# Patient Record
Sex: Female | Born: 1960 | Race: White | Hispanic: No | Marital: Married | Smoking: Current every day smoker
Health system: Southern US, Community
[De-identification: ages and names within clinical notes are randomized; demographics above are authoritative.]

## PROBLEM LIST (undated history)

## (undated) DIAGNOSIS — K219 Gastro-esophageal reflux disease without esophagitis: Secondary | ICD-10-CM

## (undated) DIAGNOSIS — B9689 Other specified bacterial agents as the cause of diseases classified elsewhere: Secondary | ICD-10-CM

## (undated) DIAGNOSIS — F419 Anxiety disorder, unspecified: Secondary | ICD-10-CM

## (undated) DIAGNOSIS — N898 Other specified noninflammatory disorders of vagina: Secondary | ICD-10-CM

## (undated) DIAGNOSIS — N760 Acute vaginitis: Secondary | ICD-10-CM

## (undated) DIAGNOSIS — R102 Pelvic and perineal pain: Secondary | ICD-10-CM

## (undated) DIAGNOSIS — J310 Chronic rhinitis: Secondary | ICD-10-CM

## (undated) DIAGNOSIS — M8580 Other specified disorders of bone density and structure, unspecified site: Secondary | ICD-10-CM

## (undated) DIAGNOSIS — Z87440 Personal history of urinary (tract) infections: Secondary | ICD-10-CM

## (undated) DIAGNOSIS — Z8639 Personal history of other endocrine, nutritional and metabolic disease: Secondary | ICD-10-CM

## (undated) DIAGNOSIS — M1990 Unspecified osteoarthritis, unspecified site: Secondary | ICD-10-CM

## (undated) DIAGNOSIS — I10 Essential (primary) hypertension: Secondary | ICD-10-CM

## (undated) DIAGNOSIS — M549 Dorsalgia, unspecified: Secondary | ICD-10-CM

## (undated) DIAGNOSIS — R350 Frequency of micturition: Secondary | ICD-10-CM

## (undated) DIAGNOSIS — J449 Chronic obstructive pulmonary disease, unspecified: Secondary | ICD-10-CM

## (undated) DIAGNOSIS — N941 Unspecified dyspareunia: Secondary | ICD-10-CM

## (undated) HISTORY — DX: Acute vaginitis: N76.0

## (undated) HISTORY — PX: TUBAL LIGATION: SHX77

## (undated) HISTORY — DX: Chronic rhinitis: J31.0

## (undated) HISTORY — PX: CERVICAL FUSION: SHX112

## (undated) HISTORY — DX: Pelvic and perineal pain: R10.2

## (undated) HISTORY — DX: Gastro-esophageal reflux disease without esophagitis: K21.9

## (undated) HISTORY — DX: Other specified bacterial agents as the cause of diseases classified elsewhere: B96.89

## (undated) HISTORY — DX: Unspecified dyspareunia: N94.10

## (undated) HISTORY — DX: Other specified noninflammatory disorders of vagina: N89.8

## (undated) HISTORY — DX: Personal history of urinary (tract) infections: Z87.440

## (undated) HISTORY — PX: BREAST BIOPSY: SHX20

## (undated) HISTORY — DX: Unspecified osteoarthritis, unspecified site: M19.90

## (undated) HISTORY — DX: Personal history of other endocrine, nutritional and metabolic disease: Z86.39

## (undated) HISTORY — DX: Frequency of micturition: R35.0

## (undated) HISTORY — DX: Dorsalgia, unspecified: M54.9

## (undated) HISTORY — DX: Chronic obstructive pulmonary disease, unspecified: J44.9

## (undated) HISTORY — DX: Other specified disorders of bone density and structure, unspecified site: M85.80

---
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
## 2001-09-18 ENCOUNTER — Emergency Department (HOSPITAL_COMMUNITY): Admission: EM | Admit: 2001-09-18 | Discharge: 2001-09-18 | Payer: Self-pay | Admitting: Emergency Medicine

## 2001-09-24 ENCOUNTER — Ambulatory Visit (HOSPITAL_COMMUNITY): Admission: RE | Admit: 2001-09-24 | Discharge: 2001-09-24 | Payer: Self-pay | Admitting: Family Medicine

## 2001-09-24 ENCOUNTER — Encounter: Payer: Self-pay | Admitting: Family Medicine

## 2002-01-17 ENCOUNTER — Encounter: Payer: Self-pay | Admitting: Emergency Medicine

## 2002-01-17 ENCOUNTER — Emergency Department (HOSPITAL_COMMUNITY): Admission: EM | Admit: 2002-01-17 | Discharge: 2002-01-17 | Payer: Self-pay | Admitting: Emergency Medicine

## 2002-01-21 HISTORY — PX: HYSTEROSCOPY W/ ENDOMETRIAL ABLATION: SUR665

## 2002-09-30 ENCOUNTER — Ambulatory Visit (HOSPITAL_COMMUNITY): Admission: RE | Admit: 2002-09-30 | Discharge: 2002-09-30 | Payer: Self-pay | Admitting: Obstetrics & Gynecology

## 2004-10-03 ENCOUNTER — Ambulatory Visit (HOSPITAL_COMMUNITY): Admission: RE | Admit: 2004-10-03 | Discharge: 2004-10-03 | Payer: Self-pay | Admitting: Family Medicine

## 2005-03-11 ENCOUNTER — Ambulatory Visit (HOSPITAL_COMMUNITY): Admission: RE | Admit: 2005-03-11 | Discharge: 2005-03-11 | Payer: Self-pay | Admitting: General Surgery

## 2006-05-20 ENCOUNTER — Ambulatory Visit (HOSPITAL_COMMUNITY): Admission: RE | Admit: 2006-05-20 | Discharge: 2006-05-20 | Payer: Self-pay | Admitting: Family Medicine

## 2008-01-01 ENCOUNTER — Ambulatory Visit (HOSPITAL_COMMUNITY): Admission: RE | Admit: 2008-01-01 | Discharge: 2008-01-01 | Payer: Self-pay | Admitting: Internal Medicine

## 2008-01-04 ENCOUNTER — Ambulatory Visit (HOSPITAL_COMMUNITY): Admission: RE | Admit: 2008-01-04 | Discharge: 2008-01-04 | Payer: Self-pay | Admitting: Internal Medicine

## 2008-01-09 ENCOUNTER — Emergency Department (HOSPITAL_COMMUNITY): Admission: EM | Admit: 2008-01-09 | Discharge: 2008-01-09 | Payer: Self-pay | Admitting: Emergency Medicine

## 2008-01-22 DIAGNOSIS — J449 Chronic obstructive pulmonary disease, unspecified: Secondary | ICD-10-CM

## 2008-01-22 HISTORY — DX: Chronic obstructive pulmonary disease, unspecified: J44.9

## 2008-02-08 ENCOUNTER — Ambulatory Visit (HOSPITAL_COMMUNITY): Admission: RE | Admit: 2008-02-08 | Discharge: 2008-02-08 | Payer: Self-pay | Admitting: Internal Medicine

## 2009-06-21 HISTORY — PX: BIOPSY THYROID: PRO38

## 2009-06-26 ENCOUNTER — Other Ambulatory Visit: Admission: RE | Admit: 2009-06-26 | Discharge: 2009-06-26 | Payer: Self-pay | Admitting: Obstetrics and Gynecology

## 2009-06-28 ENCOUNTER — Ambulatory Visit (HOSPITAL_COMMUNITY): Admission: RE | Admit: 2009-06-28 | Discharge: 2009-06-28 | Payer: Self-pay | Admitting: Obstetrics & Gynecology

## 2009-06-29 ENCOUNTER — Ambulatory Visit (HOSPITAL_COMMUNITY): Admission: RE | Admit: 2009-06-29 | Discharge: 2009-06-29 | Payer: Self-pay | Admitting: Obstetrics & Gynecology

## 2009-07-07 ENCOUNTER — Ambulatory Visit (HOSPITAL_COMMUNITY): Admission: RE | Admit: 2009-07-07 | Discharge: 2009-07-07 | Payer: Self-pay | Admitting: Obstetrics & Gynecology

## 2010-02-11 ENCOUNTER — Encounter: Payer: Self-pay | Admitting: Internal Medicine

## 2010-02-11 ENCOUNTER — Encounter: Payer: Self-pay | Admitting: General Surgery

## 2010-05-09 DIAGNOSIS — B9689 Other specified bacterial agents as the cause of diseases classified elsewhere: Secondary | ICD-10-CM

## 2010-05-09 HISTORY — DX: Other specified bacterial agents as the cause of diseases classified elsewhere: B96.89

## 2010-06-08 NOTE — Op Note (Signed)
NAME:  Felicia Greer, Felicia Greer                           ACCOUNT NO.:  [id number]   MEDICAL RECORD NO.:  [id number]                   PATIENT TYPE:  AMB   LOCATION:  DAY                                  FACILITY:  [facility]   PHYSICIAN:  Lazaro Arms, M.D.                DATE OF BIRTH:  01-15-1961   DATE OF PROCEDURE:  09/30/2002  DATE OF DISCHARGE:                                 OPERATIVE REPORT   PREOPERATIVE DIAGNOSES:  1. Menometrorrhagia.  2. Dysmenorrhea.   POSTOPERATIVE DIAGNOSES:  1. Menometrorrhagia.  2. Dysmenorrhea.   PROCEDURE:  Hysteroscopy, D&C, with endometrial ablation.   SURGEON:  Lazaro Arms, M.D.   ANESTHESIA:  General endotracheal.   FINDINGS:  The patient is a 50 year old white female who has monthly cycles  that are quite heavy and very painful.  She is a significant smoker and does  not want to use birth control pills, which I agree with.  As a result, we  will proceed with ablation, hysteroscopy, D&C.  At the time of surgery, the  patient had a normal endometrium.  There were no polyps or submucosal  myomas.  She has somewhat of a shaggy endometrial lining.   DESCRIPTION OF PROCEDURE:  The patient was taken to the operating room and  placed in the supine position where she underwent general endotracheal  anesthesia.  She was placed in the dorsal lithotomy position and prepped and  draped in the usual sterile fashion.  The bladder was drained.   A Graves speculum was placed.  Her cervix was grasped with a single-tooth  tenaculum.  A paracervical block was placed using 0.5% Marcaine with  1:200,000 epinephrine, 10 cc on either side.  The uterus sounded to 9 cm.  The cervix was dilated serially to allow passage of the hysteroscope.  The  above-noted findings at the time of hysteroscopy was performed.  Normal  saline was used as a distending media.  A vigorous uterine curettage was  then performed with a sharp curette, and a large amount of endometrial  tissue  was removed and sent to pathology for evaluation.  The endometrial  ablation using the ThermaChoice balloon was then performed.  It required 30  cc of D5W to maintain a pressure in the 195 to 205 range.  It was then  heated up to 88 degrees Celsius or 187 degrees Fahrenheit for a total  therapy time of 10 minutes and 47 seconds.   The patient tolerated the procedure well.  All fluid was accounted for at  the end of the procedure.  She was awakened from anesthesia and taken to the  recovery room in good and stable condition.  She received Ancef and Toradol  prophylactically.  Lazaro Arms, M.D.    [name]  D:  09/30/2002  T:  09/30/2002  Job:  [id number]

## 2010-06-08 NOTE — H&P (Signed)
NAME:  Felicia Greer, Felicia Greer                           ACCOUNT NO.:  [id number]   MEDICAL RECORD NO.:  [id number]                   PATIENT TYPE:  AMB   LOCATION:  DAY                                  FACILITY:  [facility]   PHYSICIAN:  Lazaro Arms, M.D.                DATE OF BIRTH:  07/08/1960   DATE OF ADMISSION:  DATE OF DISCHARGE:                                HISTORY & PHYSICAL   HISTORY OF PRESENT ILLNESS:  The patient is a 50 year old white female,  gravida 3, para 3, who has been suffering with heavy and prolonged bleeding  each month.  She bleeds for about 14 days at a time and very heavy.  She has  to wear tampons and pads at the same time.  She soils her clothes and her  sheets.  She has a lot of cramping with it as well.  She is a fairly  significant smoker and even though she is thin, I do not want to put her on  birth control pills and neither does she.  We talked about the option of  endometrial ablation.  She was given a brochure on that.  We talked  extensively, answered her husband's questions, and will proceed with  hysteroscopy D&C and endometrial ablation.   PAST MEDICAL HISTORY:  Negative.   PAST SURGICAL HISTORY:  1. She had tubal ligation in 1987.  2. She had a right breast biopsy in the early 1990s that was negative.  3. Cervical fusion in the early 1990s.   ALLERGIES:  Her only allergy is to CODEINE, which is really a sensitivity,  causes nausea and vomiting.   CURRENT MEDICATIONS:  Celexa 40 a day.   REVIEW OF SYSTEMS:  Otherwise negative.   PHYSICAL EXAMINATION:  VITAL SIGNS:  Her weight is 115 pounds, blood  pressure 100/60.  HEENT:  Unremarkable.  NECK:  Thyroid is normal.  LUNGS:  Clear.  HEART:  Regular rate and rhythm, no regurge or gallop.  BREASTS:  Deferred.  ABDOMEN:  Benign.  No hepatosplenomegaly or masses.  GENITALIA:  She has normal external genitalia.  Vagina appears normal  without discharge.  Cervix is fair, slight lesions.  The last  Pap smear was  normal.  Uterus is normal size, shape, contour.  Ovaries normal, nontender.   IMPRESSION:  1. Hypermenorrhea.  2. Dysmenorrhea.    PLAN:  The patient is admitted for hysteroscopy D&C and endometrial  ablation.  All questions were answered.  She was given the handout regarding  it and we will proceed.  The patient understands the risks of bleeding,  infection, and uterine perforation, and damage to other organs.  Lazaro Arms, M.D.    Felicia Greer  D:  09/29/2002  T:  09/29/2002  Job:  [id number]

## 2010-10-25 LAB — COMPREHENSIVE METABOLIC PANEL
AST: 28 U/L (ref 0–37)
Alkaline Phosphatase: 69 U/L (ref 39–117)
Calcium: 9 mg/dL (ref 8.4–10.5)
Creatinine, Ser: 0.67 mg/dL (ref 0.4–1.2)
GFR calc Af Amer: >60 mL/min (ref >60)
GFR calc non Af Amer: >60 mL/min (ref >60)
Glucose, Bld: 93 mg/dL (ref 70–99)
Potassium: 3.6 mEq/L (ref 3.5–5.1)
Sodium: 136 mEq/L (ref 135–145)
Total Bilirubin: 0.6 mg/dL (ref 0.3–1.2)
Total Protein: 6.3 g/dL (ref 6.0–8.3)

## 2010-10-25 LAB — CBC
HCT: 42.3 % (ref 36.0–46.0)
Hemoglobin: 14.3 g/dL (ref 12.0–15.0)
WBC: 8.2 10*3/uL (ref 4.0–10.5)

## 2010-10-25 LAB — SEDIMENTATION RATE: Sed Rate: 5 mm/hr (ref 0–22)

## 2010-10-25 LAB — DIFFERENTIAL
Basophils Relative: 1 % (ref 0–1)
Lymphocytes Relative: 16 % (ref 12–46)
Lymphs Abs: 1.3 10*3/uL (ref 0.7–4.0)
Neutro Abs: 6.1 10*3/uL (ref 1.7–7.7)

## 2010-10-25 LAB — CK TOTAL AND CKMB (NOT AT ARMC): CK, MB: 3 ng/mL (ref 0.3–4.0)

## 2010-10-25 LAB — URINALYSIS, ROUTINE W REFLEX MICROSCOPIC
Nitrite: NEGATIVE
Urobilinogen, UA: 0.2 mg/dL (ref 0.0–1.0)
pH: 5.5 (ref 5.0–8.0)

## 2010-10-25 LAB — POCT CARDIAC MARKERS: CKMB, poc: 1.5 ng/mL (ref 1.0–8.0)

## 2010-10-25 LAB — TROPONIN I: Troponin I: 0.01 ng/mL (ref 0.00–0.06)

## 2010-10-25 LAB — D-DIMER, QUANTITATIVE: D-Dimer, Quant: 0.37 ug/mL-FEU (ref 0.00–0.48)

## 2010-11-07 IMAGING — CR DG CHEST 2V
2 series · 2 of 2 positions shown · non-contrast
Comparison: 05/20/2006

CLINICAL DATA: Left pleuritic chest pain

CHEST - 2 VIEW

[view not recorded (1 of 2)]
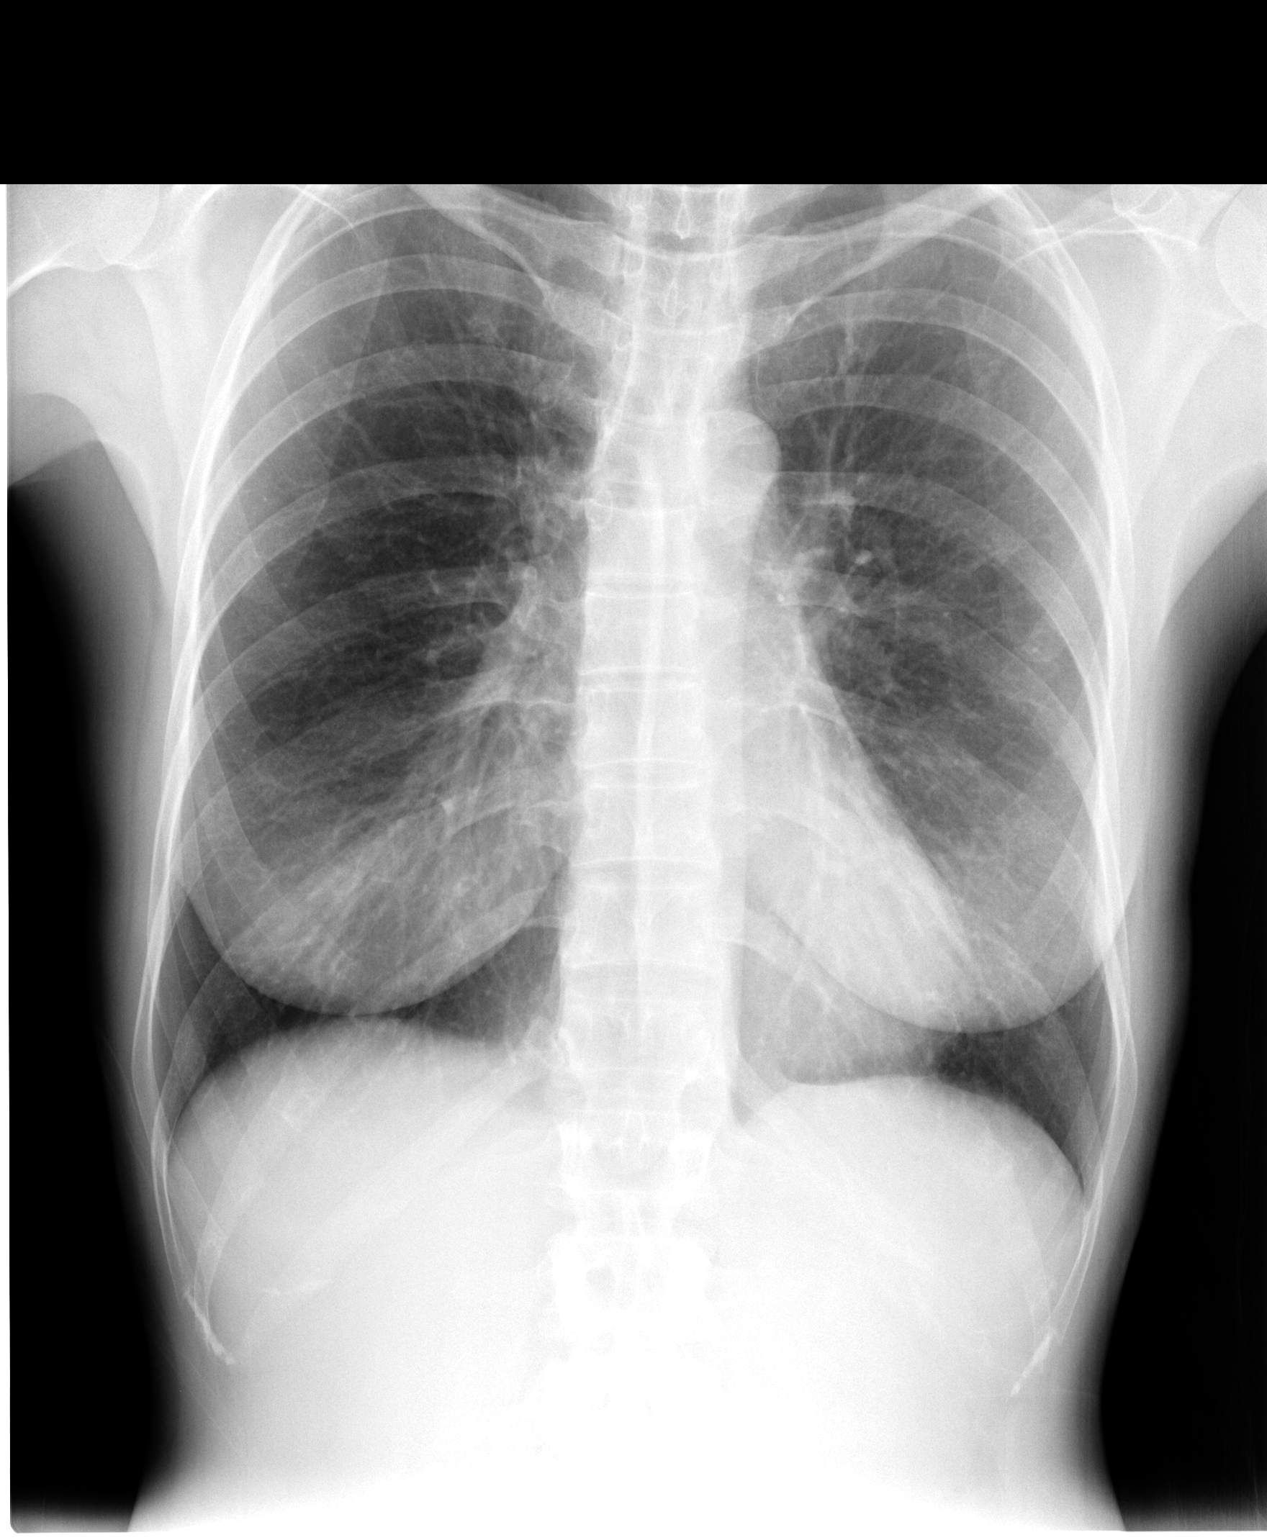

[view not recorded (2 of 2)]
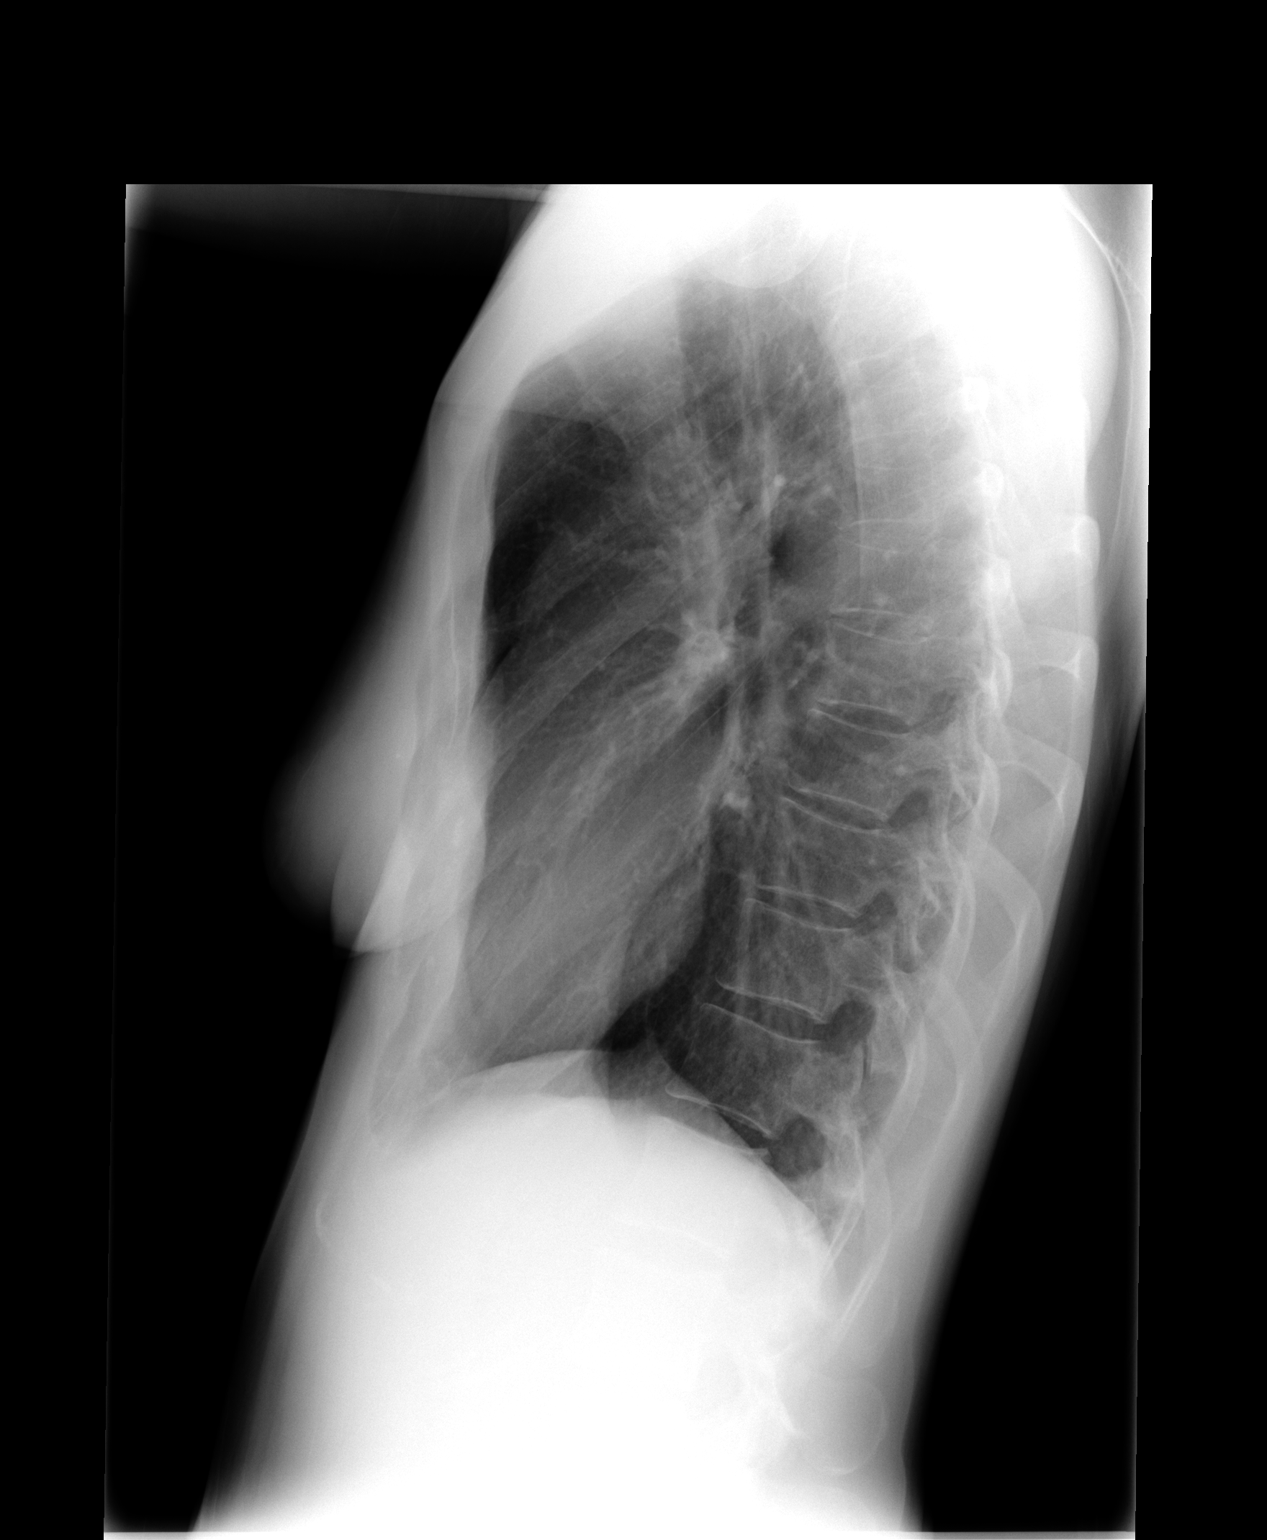

[2 of 2 positions shown; findings below may reference images not displayed]

FINDINGS: COPD/emphysema.  No acute chest findings.
Cardiomediastinal silhouette normal.  Mild pectus excavatum
deformity.
IMPRESSION: COPD.  No acute chest findings.  No interval change.

## 2011-05-08 ENCOUNTER — Other Ambulatory Visit: Payer: Self-pay | Admitting: Adult Health

## 2011-05-08 DIAGNOSIS — Z139 Encounter for screening, unspecified: Secondary | ICD-10-CM

## 2011-05-24 ENCOUNTER — Other Ambulatory Visit (HOSPITAL_COMMUNITY)
Admission: RE | Admit: 2011-05-24 | Discharge: 2011-05-24 | Disposition: A | Discharge status: Home / Self Care | Payer: 59 | Source: Ambulatory Visit | Attending: Obstetrics and Gynecology | Admitting: Obstetrics and Gynecology

## 2011-05-24 ENCOUNTER — Ambulatory Visit (HOSPITAL_COMMUNITY)
Admission: RE | Admit: 2011-05-24 | Discharge: 2011-05-24 | Disposition: A | Discharge status: Home / Self Care | Payer: 59 | Source: Ambulatory Visit | Attending: Adult Health | Admitting: Adult Health

## 2011-05-24 ENCOUNTER — Other Ambulatory Visit: Payer: Self-pay | Admitting: Adult Health

## 2011-05-24 DIAGNOSIS — Z139 Encounter for screening, unspecified: Secondary | ICD-10-CM

## 2011-05-24 DIAGNOSIS — Z01419 Encounter for gynecological examination (general) (routine) without abnormal findings: Secondary | ICD-10-CM | POA: Insufficient documentation

## 2011-05-24 DIAGNOSIS — Z1231 Encounter for screening mammogram for malignant neoplasm of breast: Secondary | ICD-10-CM | POA: Insufficient documentation

## 2011-05-24 DIAGNOSIS — Z1159 Encounter for screening for other viral diseases: Secondary | ICD-10-CM | POA: Insufficient documentation

## 2011-10-07 ENCOUNTER — Emergency Department (HOSPITAL_COMMUNITY): Payer: 59

## 2011-10-07 ENCOUNTER — Observation Stay (HOSPITAL_COMMUNITY): Payer: 59

## 2011-10-07 ENCOUNTER — Observation Stay (HOSPITAL_COMMUNITY)
Admission: EM | Admit: 2011-10-07 | Discharge: 2011-10-07 | Disposition: A | Discharge status: Home / Self Care | Payer: 59 | Attending: Emergency Medicine | Admitting: Emergency Medicine

## 2011-10-07 ENCOUNTER — Encounter (HOSPITAL_COMMUNITY): Payer: Self-pay | Admitting: *Deleted

## 2011-10-07 DIAGNOSIS — K219 Gastro-esophageal reflux disease without esophagitis: Secondary | ICD-10-CM

## 2011-10-07 DIAGNOSIS — F17200 Nicotine dependence, unspecified, uncomplicated: Secondary | ICD-10-CM | POA: Insufficient documentation

## 2011-10-07 DIAGNOSIS — R61 Generalized hyperhidrosis: Secondary | ICD-10-CM | POA: Insufficient documentation

## 2011-10-07 DIAGNOSIS — R079 Chest pain, unspecified: Principal | ICD-10-CM | POA: Insufficient documentation

## 2011-10-07 DIAGNOSIS — Z8249 Family history of ischemic heart disease and other diseases of the circulatory system: Secondary | ICD-10-CM | POA: Insufficient documentation

## 2011-10-07 DIAGNOSIS — R531 Weakness: Secondary | ICD-10-CM

## 2011-10-07 DIAGNOSIS — K229 Disease of esophagus, unspecified: Secondary | ICD-10-CM

## 2011-10-07 DIAGNOSIS — R110 Nausea: Secondary | ICD-10-CM | POA: Insufficient documentation

## 2011-10-07 LAB — CBC WITH DIFFERENTIAL/PLATELET
Basophils Relative: 1 % (ref 0–1)
Eosinophils Absolute: 0.8 10*3/uL — ABNORMAL HIGH (ref 0.0–0.7)
MCH: 30.4 pg (ref 26.0–34.0)
MCV: 88.2 fL (ref 78.0–100.0)
Monocytes Relative: 6 % (ref 3–12)
Neutro Abs: 4.8 10*3/uL (ref 1.7–7.7)
RDW: 13.5 % (ref 11.5–15.5)

## 2011-10-07 LAB — COMPREHENSIVE METABOLIC PANEL
AST: 21 U/L (ref 0–37)
Albumin: 3.7 g/dL (ref 3.5–5.2)
BUN: 9 mg/dL (ref 6–23)
CO2: 30 mEq/L (ref 19–32)
Calcium: 9.7 mg/dL (ref 8.4–10.5)
Chloride: 102 mEq/L (ref 96–112)
Creatinine, Ser: 0.63 mg/dL (ref 0.50–1.10)
GFR calc Af Amer: >90 mL/min (ref >90)
Glucose, Bld: 95 mg/dL (ref 70–99)
Sodium: 139 mEq/L (ref 135–145)

## 2011-10-07 LAB — POCT I-STAT TROPONIN I: Troponin i, poc: 0 ng/mL (ref 0.00–0.08)

## 2011-10-07 LAB — URINALYSIS, ROUTINE W REFLEX MICROSCOPIC
Hgb urine dipstick: NEGATIVE
pH: 7 (ref 5.0–8.0)

## 2011-10-07 LAB — TROPONIN I: Troponin I: <0.3 ng/mL (ref <0.30)

## 2011-10-07 MED ORDER — METOPROLOL TARTRATE 1 MG/ML IV SOLN
INTRAVENOUS | Status: AC
  Filled 2011-10-07: qty 5

## 2011-10-07 MED ORDER — METOPROLOL TARTRATE 25 MG PO TABS
50.0000 mg | ORAL_TABLET | Freq: Once | ORAL | Status: AC
  Administered 2011-10-07: 50 mg via ORAL
  Filled 2011-10-07: qty 2

## 2011-10-07 MED ORDER — ASPIRIN 81 MG PO CHEW
324.0000 mg | CHEWABLE_TABLET | Freq: Once | ORAL | Status: AC
  Administered 2011-10-07: 324 mg via ORAL
  Filled 2011-10-07: qty 4

## 2011-10-07 MED ORDER — IOHEXOL 350 MG/ML SOLN
80.0000 mL | Freq: Once | INTRAVENOUS | Status: AC | PRN
  Administered 2011-10-07: 80 mL via INTRAVENOUS

## 2011-10-07 NOTE — Progress Notes (Signed)
Utilization review completed.  

## 2011-10-07 NOTE — ED Notes (Signed)
Pt a x 4. Denying any cp. Waiting for CT morp heart. Giving metoprolol 50 mg for HR 64.

## 2011-10-07 NOTE — ED Provider Notes (Signed)
History     CSN: [id number]  Arrival date & time 10/07/11  0234   First MD Initiated Contact with Patient 10/07/11 [phone]      Chief Complaint  Patient presents with  . multiple symptoms/     (Consider location/radiation/quality/duration/timing/severity/associated sxs/prior treatment) HPI Patient is a 51 year old woman with a 60-pack-year smoking history and a strong family history of coronary artery disease. She presents from home after awakening around midnight with a sensation of diffuse chest tightness. This was associated by a feeling of clamminess and nausea. The patient denies experiencing any shortness of breath. She describes her discomfort as 7/10 in severity. She is currently symptom-free. She has a history of similar symptoms. She has not identified any exacerbating or relieving factors.  The patient notes that she had an associated feeling of her arms feeling hot and flushed. In addition, she felt like her face was weak bilaterally.  The patient has never had any cardiac evaluation. She had a sister who died of an MI at age 66. Her brother has been diagnosed with coronary artery disease but, she does not know specifics. Her father also had coronary artery disease, she does not know at what age she was diagnosed. The patient is a former 2 pack per day smoker having quit just this past March 2013.   History reviewed. No pertinent past medical history. - copd  History reviewed. No pertinent past surgical history.  No family history on file. - + for CAD (see hpi)  History  Substance Use Topics  . Smoking status: Current Every Day Smoker  . Smokeless tobacco: Not on file  . Alcohol Use: No   Correction to above nursing notes: patient is a former smoker and uses only an electronic cigarette at this time.   OB History    Grav Para Term Preterm Abortions TAB SAB Ect Mult Living                  Review of Systems  Gen: no weight loss, fevers, chills, night sweats Eyes:  no discharge or drainage, no occular pain or visual changes Nose: no epistaxis or rhinorrhea Mouth: no dental pain, no sore throat Neck: no neck pain Lungs: no SOB, cough, wheezing CV: As per history of present illness, otherwise negative Abd: no abdominal pain, +  nausea, vomiting GU: no dysuria or gross hematuria MSK: no myalgias or arthralgias Neuro: no headache, no focal neurologic deficits Skin: no rash Psyche: negative.  Allergies  Codeine  Home Medications    BP 118/67  Pulse 73  Temp 97.8 F (36.6 C) (Oral)  Resp 18  SpO2 97%  Physical Exam  Gen: well developed and well nourished appearing, mildly anxious appearing, alert and oriented.  Head: NCAT Eyes: PERL, EOMI Nose: no epistaixis or rhinorrhea Mouth/throat: mucosa is moist and pink Neck: supple, no stridor Lungs: CTA B, no wheezing, rhonchi or rales CV: RRR, no murmur, extremities well perfused, no jvd Abd: soft, notender, nondistended Back: no ttp, no cva ttp Skin: no rashese, wnl Neuro: CN ii-xii grossly intact, no focal deficits Psyche; normal affect,  calm and cooperative.   ED Course  Procedures (including critical care time)  Labs Reviewed  CBC WITH DIFFERENTIAL - Abnormal; Notable for the following:    Eosinophils Relative 10 (*)     Eosinophils Absolute 0.8 (*)     All other components within normal limits  URINALYSIS, ROUTINE W REFLEX MICROSCOPIC - Abnormal; Notable for the following:    Color, Urine  STRAW (*)     All other components within normal limits  COMPREHENSIVE METABOLIC PANEL  TROPONIN I  POCT I-STAT TROPONIN I    Results for orders placed during the hospital encounter of 10/07/11 (from the past 24 hour(s))  CBC WITH DIFFERENTIAL     Status: Abnormal   Collection Time   10/07/11  2:41 AM      Component Value Range   WBC 8.3  4.0 - 10.5 K/uL   RBC 4.34  3.87 - 5.11 MIL/uL   Hemoglobin 13.2  12.0 - 15.0 g/dL   HCT 09.8  11.9 - 14.7 %   MCV 88.2  78.0 - 100.0 fL   MCH 30.4   26.0 - 34.0 pg   MCHC 34.5  30.0 - 36.0 g/dL   RDW 82.9  56.2 - 13.0 %   Platelets 250  150 - 400 K/uL   Neutrophils Relative 58  43 - 77 %   Neutro Abs 4.8  1.7 - 7.7 K/uL   Lymphocytes Relative 26  12 - 46 %   Lymphs Abs 2.2  0.7 - 4.0 K/uL   Monocytes Relative 6  3 - 12 %   Monocytes Absolute 0.5  0.1 - 1.0 K/uL   Eosinophils Relative 10 (*) 0 - 5 %   Eosinophils Absolute 0.8 (*) 0.0 - 0.7 K/uL   Basophils Relative 1  0 - 1 %   Basophils Absolute 0.1  0.0 - 0.1 K/uL  COMPREHENSIVE METABOLIC PANEL     Status: Normal   Collection Time   10/07/11  2:41 AM      Component Value Range   Sodium 139  135 - 145 mEq/L   Potassium 3.8  3.5 - 5.1 mEq/L   Chloride 102  96 - 112 mEq/L   CO2 30  19 - 32 mEq/L   Glucose, Bld 95  70 - 99 mg/dL   BUN 9  6 - 23 mg/dL   Creatinine, Ser 8.65  0.50 - 1.10 mg/dL   Calcium 9.7  8.4 - 78.4 mg/dL   Total Protein 6.9  6.0 - 8.3 g/dL   Albumin 3.7  3.5 - 5.2 g/dL   AST 21  0 - 37 U/L   ALT 16  0 - 35 U/L   Alkaline Phosphatase 83  39 - 117 U/L   Total Bilirubin 0.3  0.3 - 1.2 mg/dL   GFR calc non Af Amer >90  >90 mL/min   GFR calc Af Amer >90  >90 mL/min  TROPONIN I     Status: Normal   Collection Time   10/07/11  2:41 AM      Component Value Range   Troponin I <0.30  <0.30 ng/mL  URINALYSIS, ROUTINE W REFLEX MICROSCOPIC     Status: Abnormal   Collection Time   10/07/11  2:59 AM      Component Value Range   Color, Urine STRAW (*) YELLOW   APPearance CLEAR  CLEAR   Specific Gravity, Urine 1.007  1.005 - 1.030   pH 7.0  5.0 - 8.0   Glucose, UA NEGATIVE  NEGATIVE mg/dL   Hgb urine dipstick NEGATIVE  NEGATIVE   Bilirubin Urine NEGATIVE  NEGATIVE   Ketones, ur NEGATIVE  NEGATIVE mg/dL   Protein, ur NEGATIVE  NEGATIVE mg/dL   Urobilinogen, UA 0.2  0.0 - 1.0 mg/dL   Nitrite NEGATIVE  NEGATIVE   Leukocytes, UA NEGATIVE  NEGATIVE  POCT I-STAT TROPONIN I     Status:  Normal   Collection Time   10/07/11  3:04 AM      Component Value Range    Troponin i, poc 0.00  0.00 - 0.08 ng/mL   Comment 3            EKG: nsr, normal axis, normal rate, normal qrs, no acute ischemic changes  CXR: no infilltrate, wnl  Ddx: ACS, GERD, anxiety, pna, ptx, copd excacerbation   MDM  ED work up is non-diagnostic with normal EKG, first troponin, CXR. Patient with normal VS and symptom free. Tx with ASA in ED. TIMI score 0, HEART score: 3.  Good candidate for CDU chest pain protocol with plan for functional study - either cardiac CT or treadmill stress.          Brandt Loosen, MD 10/07/11 [phone]

## 2011-10-07 NOTE — ED Notes (Signed)
The pt is c/o lower back painmdizziness feeling hot in her veins.  Chest tightness.  And she feel weak

## 2011-10-07 NOTE — ED Notes (Signed)
Placed on  Cardiac monitor - NSR 60s

## 2011-10-07 NOTE — ED Provider Notes (Signed)
7:30 AM Patient in CDU on chest pain. Protocol. States that she had sudden onset weakness, presyncope, and chest tightness. All labs have been negative. Patient is asymptomatic at this time. Labs show eosinophilia. Patient reports recent history of seasonal allergies. CV: RRR, No M/R/G, Peripheral pulses intact. No peripheral edema. Lungs: CTAB Abd: Soft, Non tender, non distended   12:00 PM Patient results of CT angiogram negative. Angiogram did reveal thickening of the distal esophagus consistent with history of GERD. Instructed the patient that she should take her Protonix prescribed by her primary care physician. Daily. She should also reported. This finding to her PCP and may need followup with gastroenterology. All questions answered fully.Discussed reasons to seek immediate care. Patient expresses understanding and agrees with plan.    Arthor Captain, PA-C 10/07/11 1253

## 2011-10-07 NOTE — ED Notes (Signed)
Family at bedside. 

## 2011-10-08 NOTE — ED Provider Notes (Signed)
Medical screening examination/treatment/procedure(s) were performed by non-physician practitioner and as supervising physician I was immediately available for consultation/collaboration.   Charles B. Sheldon, MD 10/08/11 0832 

## 2011-10-28 ENCOUNTER — Other Ambulatory Visit: Payer: Self-pay | Admitting: Family Medicine

## 2011-10-28 ENCOUNTER — Ambulatory Visit (HOSPITAL_COMMUNITY)
Admission: RE | Admit: 2011-10-28 | Discharge: 2011-10-28 | Disposition: A | Discharge status: Home / Self Care | Payer: 59 | Source: Ambulatory Visit | Attending: Family Medicine | Admitting: Family Medicine

## 2011-10-28 DIAGNOSIS — M25559 Pain in unspecified hip: Secondary | ICD-10-CM | POA: Insufficient documentation

## 2011-10-28 DIAGNOSIS — M25552 Pain in left hip: Secondary | ICD-10-CM

## 2012-04-04 ENCOUNTER — Encounter: Payer: Self-pay | Admitting: Adult Health

## 2012-04-04 DIAGNOSIS — K219 Gastro-esophageal reflux disease without esophagitis: Secondary | ICD-10-CM

## 2012-04-04 DIAGNOSIS — N760 Acute vaginitis: Secondary | ICD-10-CM

## 2012-04-04 DIAGNOSIS — E049 Nontoxic goiter, unspecified: Secondary | ICD-10-CM

## 2012-04-04 DIAGNOSIS — Z87440 Personal history of urinary (tract) infections: Secondary | ICD-10-CM

## 2012-04-04 DIAGNOSIS — B9689 Other specified bacterial agents as the cause of diseases classified elsewhere: Secondary | ICD-10-CM

## 2012-04-07 ENCOUNTER — Encounter: Payer: Self-pay | Admitting: Adult Health

## 2012-04-07 ENCOUNTER — Ambulatory Visit (INDEPENDENT_AMBULATORY_CARE_PROVIDER_SITE_OTHER): Payer: No Typology Code available for payment source | Admitting: Adult Health

## 2012-04-07 VITALS — BP 100/70 | Ht 64.0 in | Wt 118.0 lb

## 2012-04-07 DIAGNOSIS — N898 Other specified noninflammatory disorders of vagina: Secondary | ICD-10-CM | POA: Insufficient documentation

## 2012-04-07 LAB — POCT WET PREP (WET MOUNT)

## 2012-04-07 MED ORDER — LUVENA VAGINAL MOISTURIZER VA GEL: 1.0000 | VAGINAL | Status: DC

## 2012-04-07 NOTE — Progress Notes (Signed)
Subjective:     Patient ID: Felicia Greer, female   DOB: 1960/04/15, 52 y.o.   MRN: [id number]  [facility] yo wf with vaginal discharge   And some dryness   Review of Systemscomplains of discharge and odor and fells dry occasionally.     Objective:   Physical Exam Pelvic exam show normal external genitalia. Vagina has decrease rugae and pale color fairly good moisture. Scant white discharge without odor, wet prep was normal.    Assessment:     Vaginal discharge    Plan:     Use Luvena every other night and call me in follow up next week, may try vaginal estrogen if persists.

## 2012-04-07 NOTE — Patient Instructions (Addendum)
Continue to use Luvena, but use every other nite

## 2012-04-22 ENCOUNTER — Other Ambulatory Visit: Payer: Self-pay | Admitting: Family Medicine

## 2012-04-23 ENCOUNTER — Encounter: Payer: Self-pay | Admitting: Family Medicine

## 2012-04-23 ENCOUNTER — Ambulatory Visit (INDEPENDENT_AMBULATORY_CARE_PROVIDER_SITE_OTHER): Payer: No Typology Code available for payment source | Admitting: Family Medicine

## 2012-04-23 VITALS — BP 112/74 | Temp 98.3°F | Wt 119.8 lb

## 2012-04-23 DIAGNOSIS — J441 Chronic obstructive pulmonary disease with (acute) exacerbation: Secondary | ICD-10-CM

## 2012-04-23 DIAGNOSIS — F419 Anxiety disorder, unspecified: Secondary | ICD-10-CM

## 2012-04-23 DIAGNOSIS — F411 Generalized anxiety disorder: Secondary | ICD-10-CM

## 2012-04-23 MED ORDER — CLARITHROMYCIN 500 MG PO TABS: 500.0000 mg | ORAL_TABLET | Freq: Two times a day (BID) | ORAL | Status: AC

## 2012-04-23 NOTE — Progress Notes (Signed)
  Subjective:    Patient ID: Felicia Greer, female    DOB: 06-11-1960, 52 y.o.   MRN: [id number]  Headache  This is a new problem. The current episode started in the past 7 days. The problem has been gradually worsening. The pain is located in the frontal region. The pain quality is similar to prior headaches. The quality of the pain is described as aching. The pain is at a severity of 4/10. Associated symptoms include coughing, drainage and facial sweating. Pertinent negatives include no fever. The symptoms are aggravated by coughing. She has tried nothing for the symptoms. The treatment provided mild relief.  Sore Throat  Associated symptoms include coughing and headaches.  Cough Associated symptoms include headaches. Pertinent negatives include no fever.      Review of Systems  Constitutional: Negative for fever.  Respiratory: Positive for cough.   Neurological: Positive for headaches.       Objective:   Physical Exam  Alert. HEENT moderate nasal congestion. Frontal tenderness. Pharynx slight erythema. Vitals reviewed. Lungs occasional cough. No wheezes or crackles. Heart regular rate and rhythm.      Assessment & Plan:  Impression sinusitis bronchitis with known underlying COPD. Plan Biaxin 500 twice a day 10 days. Symptomatic care discussed warning signs discussed. WSL

## 2012-06-22 ENCOUNTER — Other Ambulatory Visit: Payer: Self-pay | Admitting: Family Medicine

## 2012-06-22 NOTE — Telephone Encounter (Signed)
RX called in on pharmacy voicemail 

## 2012-06-22 NOTE — Telephone Encounter (Signed)
wis one mo, needs o v before further

## 2012-07-23 ENCOUNTER — Other Ambulatory Visit: Payer: Self-pay | Admitting: Family Medicine

## 2012-07-23 NOTE — Telephone Encounter (Signed)
Last seen 04/23/12 for anxiety

## 2012-10-26 ENCOUNTER — Other Ambulatory Visit: Payer: Self-pay | Admitting: Family Medicine

## 2012-10-27 NOTE — Telephone Encounter (Signed)
Not seen for half of a year. One month only. Must be seen before further ref

## 2012-12-01 ENCOUNTER — Other Ambulatory Visit: Payer: Self-pay | Admitting: *Deleted

## 2012-12-01 ENCOUNTER — Other Ambulatory Visit: Payer: Self-pay | Admitting: Family Medicine

## 2012-12-01 MED ORDER — ALPRAZOLAM 1 MG PO TABS: ORAL_TABLET | ORAL | Status: DC

## 2012-12-01 NOTE — Telephone Encounter (Signed)
Last office visit 04/23/12 for sick visit

## 2012-12-01 NOTE — Telephone Encounter (Signed)
i think we just did this

## 2012-12-20 ENCOUNTER — Emergency Department (HOSPITAL_COMMUNITY)
Admission: EM | Admit: 2012-12-20 | Discharge: 2012-12-20 | Disposition: A | Discharge status: Home / Self Care | Payer: Self-pay | Attending: Emergency Medicine | Admitting: Emergency Medicine

## 2012-12-20 ENCOUNTER — Emergency Department (HOSPITAL_COMMUNITY): Payer: Self-pay

## 2012-12-20 ENCOUNTER — Encounter (HOSPITAL_COMMUNITY): Payer: Self-pay | Admitting: Emergency Medicine

## 2012-12-20 DIAGNOSIS — Z862 Personal history of diseases of the blood and blood-forming organs and certain disorders involving the immune mechanism: Secondary | ICD-10-CM | POA: Insufficient documentation

## 2012-12-20 DIAGNOSIS — Z79899 Other long term (current) drug therapy: Secondary | ICD-10-CM | POA: Insufficient documentation

## 2012-12-20 DIAGNOSIS — K219 Gastro-esophageal reflux disease without esophagitis: Secondary | ICD-10-CM | POA: Insufficient documentation

## 2012-12-20 DIAGNOSIS — Z8619 Personal history of other infectious and parasitic diseases: Secondary | ICD-10-CM | POA: Insufficient documentation

## 2012-12-20 DIAGNOSIS — S8001XA Contusion of right knee, initial encounter: Secondary | ICD-10-CM

## 2012-12-20 DIAGNOSIS — Y939 Activity, unspecified: Secondary | ICD-10-CM | POA: Insufficient documentation

## 2012-12-20 DIAGNOSIS — S93602A Unspecified sprain of left foot, initial encounter: Secondary | ICD-10-CM

## 2012-12-20 DIAGNOSIS — S8000XA Contusion of unspecified knee, initial encounter: Secondary | ICD-10-CM | POA: Insufficient documentation

## 2012-12-20 DIAGNOSIS — Z8639 Personal history of other endocrine, nutritional and metabolic disease: Secondary | ICD-10-CM | POA: Insufficient documentation

## 2012-12-20 DIAGNOSIS — Z87891 Personal history of nicotine dependence: Secondary | ICD-10-CM | POA: Insufficient documentation

## 2012-12-20 DIAGNOSIS — S93609A Unspecified sprain of unspecified foot, initial encounter: Secondary | ICD-10-CM | POA: Insufficient documentation

## 2012-12-20 DIAGNOSIS — W108XXA Fall (on) (from) other stairs and steps, initial encounter: Secondary | ICD-10-CM | POA: Insufficient documentation

## 2012-12-20 DIAGNOSIS — Z8739 Personal history of other diseases of the musculoskeletal system and connective tissue: Secondary | ICD-10-CM | POA: Insufficient documentation

## 2012-12-20 DIAGNOSIS — Z87440 Personal history of urinary (tract) infections: Secondary | ICD-10-CM | POA: Insufficient documentation

## 2012-12-20 DIAGNOSIS — Z8742 Personal history of other diseases of the female genital tract: Secondary | ICD-10-CM | POA: Insufficient documentation

## 2012-12-20 DIAGNOSIS — Y929 Unspecified place or not applicable: Secondary | ICD-10-CM | POA: Insufficient documentation

## 2012-12-20 MED ORDER — IBUPROFEN 800 MG PO TABS: 800.0000 mg | ORAL_TABLET | Freq: Three times a day (TID) | ORAL | Status: DC

## 2012-12-20 NOTE — ED Provider Notes (Signed)
Medical screening examination/treatment/procedure(s) were performed by non-physician practitioner and as supervising physician I was immediately available for consultation/collaboration.     Geoffery Lyons, MD 12/20/12 [phone]

## 2012-12-20 NOTE — ED Notes (Signed)
Pt alert & oriented x4, stable gait. Patient given discharge instructions, paperwork & prescription(s). Patient  instructed to stop at the registration desk to finish any additional paperwork. Patient verbalized understanding. Pt left department w/ no further questions. 

## 2012-12-20 NOTE — ED Provider Notes (Signed)
CSN: [id number]     Arrival date & time 12/20/12  1003 History   None    Chief Complaint  Patient presents with  . Fall  . Foot Pain    lt  . Knee Pain    rt   (Consider location/radiation/quality/duration/timing/severity/associated sxs/prior Treatment) Patient is a 52 y.o. female presenting with fall, lower extremity pain, and knee pain. The history is provided by the patient.  Fall This is a new problem. The current episode started yesterday. The problem occurs constantly. The problem has been unchanged. Associated symptoms include joint swelling and myalgias. The symptoms are aggravated by bending. She has tried NSAIDs for the symptoms. The treatment provided mild relief.  Foot Pain Associated symptoms include joint swelling and myalgias.  Knee Pain Pt reports she fell down steps yesterday and injured leg.  Pt reports swelling and pain to left foot and right knee  Past Medical History  Diagnosis Date  . Acid reflux   . Hx: UTI (urinary tract infection)   . Hx of goiter   . BV (bacterial vaginosis) 4 18 2012  . Arthritis     pelvic bone   Past Surgical History  Procedure Laterality Date  . Breast biopsy Right in the 1990s  . Tubal ligation    . Cervical fusion N/A in the 1990s  . Biopsy thyroid N/A 06/2009    had fna  . Hysteroscopy w/ endometrial ablation N/A 2004   Family History  Problem Relation Age of Onset  . Breast cancer Maternal Aunt   . Cancer Maternal Aunt     cervical   . Breast cancer Maternal Grandmother   . Breast cancer Other    History  Substance Use Topics  . Smoking status: Former Smoker    Types: Cigarettes  . Smokeless tobacco: Current User     Comment: use electronic cigs  . Alcohol Use: No   OB History   Grav Para Term Preterm Abortions TAB SAB Ect Mult Living                 Review of Systems  Musculoskeletal: Positive for joint swelling and myalgias.  All other systems reviewed and are negative.    Allergies  Codeine and  Metrogel  Home Medications   Current Outpatient Rx  Name  Route  Sig  Dispense  Refill  . ALPRAZolam (XANAX) 1 MG tablet      Take 1/2 to 1 tablet by mouth twice a day as needed.   45 tablet   2   . citalopram (CELEXA) 20 MG tablet      TAKE 1 TABLET BY MOUTH EVERY MORNING   30 tablet   5   . LUVENA VAGINAL MOISTURIZER GEL   Vaginal   Place 1 applicator vaginally every other day.         Marland Kitchen omeprazole (PRILOSEC) 40 MG capsule      TAKE 1 CAPSULE BY MOUTH EVERY MORNING   30 capsule   5   . sucralfate (CARAFATE) 1 G tablet   Oral   Take 1 g by mouth 4 (four) times daily.         . traMADol (ULTRAM) 50 MG tablet   Oral   Take 50 mg by mouth once.          BP 129/80  Pulse 74  Temp(Src) 98.3 F (36.8 C) (Oral)  Resp 16  Ht 5\' 4"  (1.626 m)  Wt 110 lb (49.896 kg)  BMI 18.87 kg/m2  SpO2 98% Physical Exam  Nursing note and vitals reviewed. Constitutional: She is oriented to person, place, and time. She appears well-developed and well-nourished.  HENT:  Head: Normocephalic and atraumatic.  Cardiovascular: Normal rate.   Pulmonary/Chest: Effort normal.  Musculoskeletal: She exhibits tenderness.  Right knee swollen tender.  Left ankle, swollen bruised  From  nv and ns intact  Neurological: She is alert and oriented to person, place, and time. She has normal reflexes.  Skin: Skin is warm.  Psychiatric: She has a normal mood and affect.    ED Course  Procedures (including critical care time) Labs Review Labs Reviewed - No data to display Imaging Review No results found.  EKG Interpretation   None      Results for orders placed in visit on 04/07/12  POCT WET PREP (WET MOUNT)      Result Value Range   Source Wet Prep POC       WBC, Wet Prep HPF POC       Bacteria Wet Prep HPF POC       BACTERIA WET PREP MORPHOLOGY POC       Clue Cells Wet Prep HPF POC       CLUE CELLS WET PREP WHIFF POC       Yeast Wet Prep HPF POC       KOH Wet Prep POC        Trichomonas Wet Prep HPF POC       Dg Knee Complete 4 Views Right  12/20/2012   CLINICAL DATA:  Followup right knee pain  EXAM: RIGHT KNEE - COMPLETE 4+ VIEW  COMPARISON:  None.  FINDINGS: There is no evidence of fracture or dislocation. Joint spaces are maintained. There may be a very small joint effusion. There is slight focal soft tissue swelling anterior to the patella.  IMPRESSION: Slight soft tissue swelling anterior to the patella. There may be a very small joint effusion. No acute bony abnormality identified.   Electronically Signed   By: Britta Mccreedy M.D.   On: 12/20/2012 11:10   Dg Foot Complete Left  12/20/2012   CLINICAL DATA:  Fall.  Pain across the lateral marked metatarsals.  EXAM: LEFT FOOT - COMPLETE 3+ VIEW  COMPARISON:  None.  FINDINGS: There is no evidence of fracture or dislocation. There is a focal area of sclerosis in the proximal phalanx of the 5th toe, of doubtful clinical significance. No definite focal soft tissue swelling is appreciated.  IMPRESSION: No acute bony abnormality identified.   Electronically Signed   By: Britta Mccreedy M.D.   On: 12/20/2012 11:08     MDM   1. Contusion of right knee, initial encounter   2. Sprain of left foot, initial encounter    Pt advised ice, elevate,   Follow up with Dr. Romeo Apple for recheck in 1 week if pain persist   Elson Areas, PA-C 12/20/12 [address] [place], [place] 12/20/12 1144

## 2012-12-20 NOTE — ED Notes (Signed)
Pt fell down steps yesterday at 1600, co lt foot pain and rt knee pain, denies loc, did not strike head or upper extremities.

## 2012-12-28 ENCOUNTER — Other Ambulatory Visit: Payer: Self-pay | Admitting: Family Medicine

## 2012-12-28 NOTE — Telephone Encounter (Signed)
One mo worth needs app bevore any further

## 2012-12-28 NOTE — Telephone Encounter (Signed)
Last seen 04/23/12

## 2012-12-29 ENCOUNTER — Other Ambulatory Visit: Payer: Self-pay | Admitting: *Deleted

## 2012-12-31 ENCOUNTER — Telehealth: Payer: Self-pay | Admitting: Family Medicine

## 2012-12-31 MED ORDER — ALPRAZOLAM 1 MG PO TABS: 0.5000 mg | ORAL_TABLET | Freq: Every day | ORAL | Status: DC | PRN

## 2012-12-31 NOTE — Telephone Encounter (Signed)
Ok times one mo rec ov early in the new yr after qualifies for ins

## 2012-12-31 NOTE — Telephone Encounter (Signed)
Patient needs Rx for xanax. Please call when complete.     [facility]

## 2012-12-31 NOTE — Telephone Encounter (Signed)
Patient notified

## 2013-01-25 ENCOUNTER — Ambulatory Visit (INDEPENDENT_AMBULATORY_CARE_PROVIDER_SITE_OTHER): Payer: BC Managed Care – PPO | Admitting: Family Medicine

## 2013-01-25 ENCOUNTER — Encounter: Payer: Self-pay | Admitting: Family Medicine

## 2013-01-25 VITALS — BP 92/60 | Temp 98.0°F | Ht 64.0 in | Wt 114.5 lb

## 2013-01-25 DIAGNOSIS — J329 Chronic sinusitis, unspecified: Secondary | ICD-10-CM

## 2013-01-25 DIAGNOSIS — J310 Chronic rhinitis: Secondary | ICD-10-CM

## 2013-01-25 MED ORDER — OMEPRAZOLE 40 MG PO CPDR: DELAYED_RELEASE_CAPSULE | ORAL | Status: DC

## 2013-01-25 MED ORDER — CLARITHROMYCIN 500 MG PO TABS: 500.0000 mg | ORAL_TABLET | Freq: Two times a day (BID) | ORAL | Status: AC

## 2013-01-25 MED ORDER — ALPRAZOLAM 1 MG PO TABS: 0.5000 mg | ORAL_TABLET | Freq: Every day | ORAL | Status: DC | PRN

## 2013-01-25 NOTE — Progress Notes (Signed)
   Subjective:    Patient ID: Bevelyn NgoSherry D Larkey, female    DOB: 05/28/1960, 53 y.o.   MRN: [id number]  Sore Throat  This is a new problem. The current episode started in the past 7 days. The problem has been unchanged. Neither side of throat is experiencing more pain than the other. There has been no fever. The pain is moderate. Associated symptoms include headaches. Associated symptoms comments: myalgias. She has tried nothing for the symptoms. The treatment provided no relief.  started sat things smelled and tasted bad  achey all over  vry bad headacheotc ibu helped some, no energy, appetite diminished  n energy, nausesated  No sig vcough, diminished energy  No fever    Review of Systems  Neurological: Positive for headaches.   No vomiting or diarrhea ROS otherwise negative    Objective:   Physical Exam  Alert afebrile mild malaise. H&T moderate his congestion frontal tenderness neck supple. Lungs clear. Heart regular in rhythm.      Assessment & Plan:  Impression post flu rhinosinusitis plan Biaxin twice a day 10 days. Symptomatic care discussed. WSL

## 2013-02-13 ENCOUNTER — Encounter: Payer: Self-pay | Admitting: *Deleted

## 2013-02-16 ENCOUNTER — Encounter: Payer: Self-pay | Admitting: Family Medicine

## 2013-02-16 ENCOUNTER — Ambulatory Visit (INDEPENDENT_AMBULATORY_CARE_PROVIDER_SITE_OTHER): Payer: BC Managed Care – PPO | Admitting: Family Medicine

## 2013-02-16 VITALS — BP 118/82 | Ht 64.0 in | Wt 118.4 lb

## 2013-02-16 DIAGNOSIS — F411 Generalized anxiety disorder: Secondary | ICD-10-CM

## 2013-02-16 DIAGNOSIS — K219 Gastro-esophageal reflux disease without esophagitis: Secondary | ICD-10-CM

## 2013-02-16 DIAGNOSIS — F419 Anxiety disorder, unspecified: Secondary | ICD-10-CM

## 2013-02-16 DIAGNOSIS — E049 Nontoxic goiter, unspecified: Secondary | ICD-10-CM

## 2013-02-16 DIAGNOSIS — J441 Chronic obstructive pulmonary disease with (acute) exacerbation: Secondary | ICD-10-CM

## 2013-02-16 MED ORDER — BUPROPION HCL ER (SR) 150 MG PO TB12: ORAL_TABLET | ORAL | Status: DC

## 2013-02-16 MED ORDER — ALPRAZOLAM 1 MG PO TABS: 0.5000 mg | ORAL_TABLET | Freq: Every day | ORAL | Status: DC | PRN

## 2013-02-16 MED ORDER — OMEPRAZOLE 40 MG PO CPDR: DELAYED_RELEASE_CAPSULE | ORAL | Status: DC

## 2013-02-16 NOTE — Progress Notes (Signed)
   Subjective:    Patient ID: Felicia Greer, female    DOB: 10/29/1960, 53 y.o.   MRN: [id number]  HPI Patient is here today for a med check.  She needs a refill on her Xanax and Prilosec.  Pt would like to talk to you about Celexa.  Having sig anxiety these days  Notes reflux is ongoing., meds help some.  Exercise not at this time,  Right knee still having pain and disc omfort after a fl  And then left fot uncomfortable too     Review of Systems No chest pain no back pain no abdominal pain no change in bowel habits no blood in stool ROS otherwise negative    Objective:   Physical Exam  Alert anxious appearing no major distress HEENT normal. Lungs diminished breath sounds. Heart regular in rhythm. Right knee no deformity no point tenderness nonspecific anterior patellar tenderness good range of motion no effusion left foot good range of motion pulses intact  ER report reviewed x-rays were negative.      Assessment & Plan:  Impression 1 COPD discussed. #2 chronic anxiety very significant with nature stress and long-standing history of anxiety. Does not want to take Celexa because of perceived sexual side effects. #3 multiple contusions. #4 reflux stable plan stop Celexa. Start Wellbutrin rationale discussed. Exercise strongly encourage. Chronic meds refilled. Both knee and ankle suffered contusion and in the spring respectively

## 2013-04-12 ENCOUNTER — Encounter: Payer: Self-pay | Admitting: Family Medicine

## 2013-04-12 ENCOUNTER — Ambulatory Visit (INDEPENDENT_AMBULATORY_CARE_PROVIDER_SITE_OTHER): Payer: BC Managed Care – PPO | Admitting: Family Medicine

## 2013-04-12 VITALS — BP 122/84 | Ht 64.0 in | Wt 120.0 lb

## 2013-04-12 DIAGNOSIS — M545 Low back pain, unspecified: Secondary | ICD-10-CM

## 2013-04-12 NOTE — Progress Notes (Signed)
   Subjective:    Patient ID: Felicia Greer, female    DOB: 07/22/1960, 53 y.o.   MRN: [id number]  HPI Patient is here today due to hip pain.  This is not a new condition. She has been seen here in the past.  Pain worse at night, deep ache. Low back pain.  Low back hurts the worse when driving.  Difficulty sleeping. Patient states due to hip pain. Also has had insomnia in the past.  Patient gives history of chronic low back pain. It appears to be worsening. Claims most of the discomfort is also at night.   Pt says her left hip hurts more than her right. She has lower back pain and right knee pain as well.  She would just like some sleep at night, but cannot get relief.   Pt takes the Ultram at night, but will wake up feeling drowsy and dizzy for half of the day.  She doesn't know if she needs to be referred to an orthopedic specialist due to arthritis in her hip.   Notes right knee pain. Anterior right need. With increased swelling at times. Crouches on the a lot.  Review of Systems No headache no chest pain no abdominal pain no change in bowel habits no blood in stool    Objective:   Physical Exam  Alert anxious appearing. Vitals review. No acute distress. Lungs clear. Heart regular in rhythm. Low back tender to percussion. Negative straight leg raise. Hip good range of motion. Left lateral hip tenderness to deep palpation. Right anterior knee slight swelling. No particular crepitations ankles without edema.      Assessment & Plan:  Impression 1 chronic low back pain. Discussed #2 left hip pain more acute. No off-and-on past couple years. Tender lateral hip. Likely left trochanteric bursitis discussed. #3 knee pain likely tendinitis discussed #4 insomnia ongoing. #5 depression/anxiety patient mentioned at the end of her visit that she has been unable to tolerate Wellbutrin either see prior notes. Plan physical therapy consult for low back exercises. Instruction. Cannot take  anti-inflammatories. Advised to stick with Tylenol during the day and Ultram it might. Orthopedic referral rationale discussed. Easily 25 minutes spent most in discussion. WSL

## 2013-04-12 NOTE — Patient Instructions (Signed)
Back Exercises Back exercises help treat and prevent back injuries. The goal of back exercises is to increase the strength of your abdominal and back muscles and the flexibility of your back. These exercises should be started when you no longer have back pain. Back exercises include:  Pelvic Tilt. Lie on your back with your knees bent. Tilt your pelvis until the lower part of your back is against the floor. Hold this position 5 to 10 sec and repeat 5 to 10 times.  Knee to Chest. Pull first 1 knee up against your chest and hold for 20 to 30 seconds, repeat this with the other knee, and then both knees. This may be done with the other leg straight or bent, whichever feels better.  Sit-Ups or Curl-Ups. Bend your knees 90 degrees. Start with tilting your pelvis, and do a partial, slow sit-up, lifting your trunk only 30 to 45 degrees off the floor. Take at least 2 to 3 seconds for each sit-up. Do not do sit-ups with your knees out straight. If partial sit-ups are difficult, simply do the above but with only tightening your abdominal muscles and holding it as directed.  Hip-Lift. Lie on your back with your knees flexed 90 degrees. Push down with your feet and shoulders as you raise your hips a couple inches off the floor; hold for 10 seconds, repeat 5 to 10 times.  Back arches. Lie on your stomach, propping yourself up on bent elbows. Slowly press on your hands, causing an arch in your low back. Repeat 3 to 5 times. Any initial stiffness and discomfort should lessen with repetition over time.  Shoulder-Lifts. Lie face down with arms beside your body. Keep hips and torso pressed to floor as you slowly lift your head and shoulders off the floor. Do not overdo your exercises, especially in the beginning. Exercises may cause you some mild back discomfort which lasts for a few minutes; however, if the pain is more severe, or lasts for more than 15 minutes, do not continue exercises until you see your caregiver.  Improvement with exercise therapy for back problems is slow.  See your caregivers for assistance with developing a proper back exercise program. Document Released: 02/15/2004 Document Revised: 04/01/2011 Document Reviewed: 11/08/2010 [employer] Patient Information 2014 [employer].  

## 2013-05-19 ENCOUNTER — Ambulatory Visit (HOSPITAL_COMMUNITY)
Admission: RE | Admit: 2013-05-19 | Discharge: 2013-05-19 | Disposition: A | Discharge status: Home / Self Care | Payer: BC Managed Care – PPO | Source: Ambulatory Visit | Attending: Family Medicine | Admitting: Family Medicine

## 2013-05-19 DIAGNOSIS — M545 Low back pain, unspecified: Secondary | ICD-10-CM | POA: Insufficient documentation

## 2013-05-19 DIAGNOSIS — R269 Unspecified abnormalities of gait and mobility: Secondary | ICD-10-CM | POA: Insufficient documentation

## 2013-05-19 DIAGNOSIS — M5380 Other specified dorsopathies, site unspecified: Secondary | ICD-10-CM | POA: Insufficient documentation

## 2013-05-19 DIAGNOSIS — J4489 Other specified chronic obstructive pulmonary disease: Secondary | ICD-10-CM | POA: Insufficient documentation

## 2013-05-19 DIAGNOSIS — IMO0001 Reserved for inherently not codable concepts without codable children: Secondary | ICD-10-CM | POA: Insufficient documentation

## 2013-05-19 DIAGNOSIS — M6281 Muscle weakness (generalized): Secondary | ICD-10-CM | POA: Insufficient documentation

## 2013-05-19 DIAGNOSIS — J449 Chronic obstructive pulmonary disease, unspecified: Secondary | ICD-10-CM | POA: Insufficient documentation

## 2013-05-19 NOTE — Evaluation (Signed)
Physical Therapy Evaluation  Patient Details  Name: Felicia Greer MRN: [id number] Date of Birth: 07/26/1960  Today's Date: 05/19/2013 Time: 0930-1015 PT Time Calculation (min): 45 min  Charges: 1 eval, [phone] [employer]            Visit#: 1 of 1  Re-eval:   Assessment Diagnosis: difficulty walking secondary to limited lumbar sine mobility and pain due to lumbar muscle strain and possible disc pathology. Prior Therapy: no  Authorization: [employer] - [employer]    Authorization Time Period:    Authorization Visit#:   of     Past Medical History:  Past Medical History  Diagnosis Date  . Acid reflux   . Hx: UTI (urinary tract infection)   . Hx of goiter   . BV (bacterial vaginosis) 4 18 2012  . Arthritis     pelvic bone  . COPD (chronic obstructive pulmonary disease) 2010   Past Surgical History:  Past Surgical History  Procedure Laterality Date  . Breast biopsy Right in the 1990s  . Tubal ligation    . Cervical fusion N/A in the 1990s  . Biopsy thyroid N/A 06/2009    had fna  . Hysteroscopy w/ endometrial ablation N/A 2004  . Breast biopsy  1980    benign    Subjective Symptoms/Limitations Symptoms: Low back pain, pain with bending forward, on right side feels like a pinch.  Pertinent History: no pertinent history.  How long can you sit comfortably?: no difficulty How long can you stand comfortably?: 1 hour How long can you walk comfortably?: no difficulty Pain Assessment Currently in Pain?: No/denies Pain Score: 0-No pain Pain Relieving Factors: laying down, and twisting = positions of comfort, alieve,  Effect of Pain on Daily Activities: sforward flexion activities: pushing, mopping, vacuming,   Precautions/Restrictions     Balance Screening    Prior Functioning     Cognition/Observation    Sensation/Coordination/Flexibility/Functional Tests Functional Tests Functional Tests: 3D hip excursion limited Lt hip adduction, with right sided back pain, limited  tibial internal rotation Functional Tests: Gait: excessive femoral external rotation, limited tibial internal rotation, limited calcaneal eversion,  Functional Tests: pain with forward flexion, relief with extension,   Assessment Lumbar AROM Lumbar Flexion: 20% limited Lumbar Extension: no limites, relieved pain Lumbar - Left Side Bend: painful on right Lumbar Strength Lumbar Flexion: 3+/5 Lumbar Extension: 3+/5  Exercise/Treatments Stretches Active Hamstring Stretch: 2 reps;10 seconds Single Knee to Chest Stretch: 5 reps;10 seconds Double Knee to Chest Stretch: 5 reps;10 seconds Prone on Elbows Stretch: 5 reps;10 seconds Press Ups: 3 reps;10 seconds Quadruped Mid Back Stretch: 5 reps;10 seconds Piriformis Stretch: 5 reps;10 seconds Hip flexor stretch 5 reps 10 seconds Hamstring stretch 5 reps 10 seconds Calf stretch 5 reps 10 seconds Supine Dead Bug: 5 reps;2 seconds Quadruped Madcat/Old Horse: 10 reps Single Arm Raise: Right;Left;5 reps  Manual Therapy Manual Therapy: Joint mobilization Joint Mobilization: grade 1-2 central posterior to anterior mobilizations  to T12-S1 no pain, Soft tissue mobilization of Rt multifidi and lumbar paraspinals desulted in decreased pain  Physical Therapy Assessment and Plan PT Assessment and Plan Clinical Impression Statement: Patient displays signs and symptoms consistent with lumbago due to lumbar oparaspinal muscle strain and possible disc  pathology. Symptoms exacerbated with flexion and extinguished with extension based exercises. following exacerbation of symptoms patient was educated on exercise progression and  displayed improved hip flexion as she was able to bring her knee to her chest when in supine.  Pt will benefit from  skilled therapeutic intervention in order to improve on the following deficits: Abnormal gait;Decreased activity tolerance;Decreased balance;Difficulty walking;Decreased strength;Decreased mobility;Increased muscle  spasms;Increased fascial restricitons;Impaired flexibility;Pain;Improper spinal/pelvic alignment;Improper body mechanics Rehab Potential: Fair PT Plan: Patient was discharged with home exercise program including all the exercises performed durign this evaluation and treatment session. Patient was instructed that furth therapy would be benefial for long term results but declinded statign "I am too busy to come to therapy anymore"  Patient's only goal was to decrease pain and this was accomplished this session  Problem List Patient Active Problem List   Diagnosis Date Noted  . COPD exacerbation 04/23/2012  . Chronic anxiety 04/23/2012  . Vaginal discharge 04/07/2012  . Acid reflux 04/04/2012  . Hx: UTI (urinary tract infection) 04/04/2012  . Thyroid goiter 04/04/2012  . BV (bacterial vaginosis) 04/04/2012    PT - End of Session Activity Tolerance: Patient tolerated treatment well General Behavior During Therapy: WFL for tasks assessed/performed PT Plan of Care PT Home Exercise Plan: LE stretch: hamstring, hip flexor, calf, groin piriformis stretches 10x 3seconds, lumbr spine: prone on elbows, prone on hands, single knee to chest, double knee to chest, mad cat, quadraped with single arm raise, bridges 10x each with 3 second hold PT Patient Instructions: to perform twice daily.  Consulted and Agree with Plan of Care: Patient  GP    Doyne KeelCash R Rahel Carlton 05/19/2013, 8:06 PM  Physician Documentation Your signature is required to indicate approval of the treatment plan as stated above.  Please sign and either send electronically or make a copy of this report for your files and return this physician signed original.   Please mark one 1.__approve of plan  2. ___approve of plan with the following conditions.   ______________________________                                                          _____________________ Physician Signature                                                                                                              Date

## 2013-08-11 ENCOUNTER — Telehealth: Payer: Self-pay | Admitting: Family Medicine

## 2013-08-11 NOTE — Telephone Encounter (Signed)
Pt has appt here 09/06/13 for med check, her Xanax will run out before then, can we write Rx for enough until then, current Rx will run out around 08/21/13, [facility], call when done

## 2013-08-11 NOTE — Telephone Encounter (Signed)
Ok one mo 

## 2013-08-12 MED ORDER — ALPRAZOLAM 1 MG PO TABS: 0.5000 mg | ORAL_TABLET | Freq: Every day | ORAL | Status: DC | PRN

## 2013-08-12 NOTE — Telephone Encounter (Signed)
Patient notified script will be faxed to pharmacy.  

## 2013-08-25 ENCOUNTER — Ambulatory Visit: Payer: BC Managed Care – PPO | Admitting: Family Medicine

## 2013-09-06 ENCOUNTER — Ambulatory Visit: Payer: BC Managed Care – PPO | Admitting: Family Medicine

## 2013-09-17 ENCOUNTER — Encounter: Payer: Self-pay | Admitting: Family Medicine

## 2013-09-17 ENCOUNTER — Ambulatory Visit (INDEPENDENT_AMBULATORY_CARE_PROVIDER_SITE_OTHER): Payer: BC Managed Care – PPO | Admitting: Family Medicine

## 2013-09-17 VITALS — BP 120/70 | Ht 64.0 in | Wt 115.4 lb

## 2013-09-17 DIAGNOSIS — F411 Generalized anxiety disorder: Secondary | ICD-10-CM

## 2013-09-17 DIAGNOSIS — F419 Anxiety disorder, unspecified: Secondary | ICD-10-CM

## 2013-09-17 DIAGNOSIS — J441 Chronic obstructive pulmonary disease with (acute) exacerbation: Secondary | ICD-10-CM

## 2013-09-17 DIAGNOSIS — K219 Gastro-esophageal reflux disease without esophagitis: Secondary | ICD-10-CM

## 2013-09-17 DIAGNOSIS — G4700 Insomnia, unspecified: Secondary | ICD-10-CM

## 2013-09-17 MED ORDER — ALPRAZOLAM 1 MG PO TABS: ORAL_TABLET | ORAL | Status: DC

## 2013-09-17 NOTE — Progress Notes (Signed)
   Subjective:    Patient ID: Felicia Greer, female    DOB: Jun 19, 1960, 53 y.o.   MRN: [id number]  HPI  Patient arrives to follow up on anxiety. Patient currently using xanax and it is helping her sx.  Exercising thru being very active  Uses one mg at night for sleep and a half tab.during the daay  Reflux overall better  Victorino Dike somewhat reg ck up's but none regularly  Patient has a lot of trouble sleeping due to her anxiety. Claims no depression.    Review of Systems No headache no chest pain no back pain no abdominal pain no change about habits no blood in stool ROS otherwise negative    Objective:   Physical Exam Alert anxious appearing no major distress. Lungs clear. Heart regular rate and rhythm. HEENT slight nasal congestion       Assessment & Plan:  #1 anxiety discussed length. #2 insomnia related to 1. #3 reflux clinically stable. #4 COPD patient thankfully no longer smoking, however still uses the vape plan diet discussed exercise discussed in encourage. Maintain same meds. Increase Xanax to 50 per day. Side effects benefits discussed. WSL

## 2013-09-19 DIAGNOSIS — G4700 Insomnia, unspecified: Secondary | ICD-10-CM | POA: Insufficient documentation

## 2013-09-28 ENCOUNTER — Ambulatory Visit (INDEPENDENT_AMBULATORY_CARE_PROVIDER_SITE_OTHER): Payer: BC Managed Care – PPO | Admitting: Family Medicine

## 2013-09-28 ENCOUNTER — Encounter: Payer: Self-pay | Admitting: Family Medicine

## 2013-09-28 VITALS — BP 110/62 | Temp 98.2°F | Ht 64.0 in | Wt 116.0 lb

## 2013-09-28 DIAGNOSIS — J329 Chronic sinusitis, unspecified: Secondary | ICD-10-CM

## 2013-09-28 DIAGNOSIS — J310 Chronic rhinitis: Secondary | ICD-10-CM

## 2013-09-28 MED ORDER — CLARITHROMYCIN 500 MG PO TABS: 500.0000 mg | ORAL_TABLET | Freq: Two times a day (BID) | ORAL | Status: AC

## 2013-09-28 NOTE — Progress Notes (Signed)
   Subjective:    Patient ID: HALLIE ERTL, female    DOB: 02/25/1960, 53 y.o.   MRN: [id number]  Cough This is a new problem. The current episode started in the past 7 days. Associated symptoms include a fever, headaches, myalgias and nasal congestion. Treatments tried: mucinex, ibuprofen.    Nauseated dizzy and queazy  Aching all over  Cough has just started   Sneezing runny nos  jeach aching primarily up towzrds  Not muchibu prn and mucinex  Resting and diminshedpleurisy started in the right lat chest  Review of Systems  Constitutional: Positive for fever.  Respiratory: Positive for cough.   Musculoskeletal: Positive for myalgias.  Neurological: Positive for headaches.       Objective:   Physical Exam Alert mild malaise vital stable frontal mass or tenderness evident. Trace normal neck supple. Lungs clear no wheezes heart rate rhythm.       Assessment & Plan:  Impression acute rhinosinusitis plan antibiotics prescribed. Symptomatic care discussed. Warning signs discussed. WSL

## 2013-12-20 ENCOUNTER — Other Ambulatory Visit: Payer: BC Managed Care – PPO | Admitting: Adult Health

## 2013-12-21 ENCOUNTER — Other Ambulatory Visit: Payer: BC Managed Care – PPO | Admitting: Adult Health

## 2014-02-15 ENCOUNTER — Other Ambulatory Visit (HOSPITAL_COMMUNITY)
Admission: RE | Admit: 2014-02-15 | Discharge: 2014-02-15 | Disposition: A | Discharge status: Home / Self Care | Payer: BLUE CROSS/BLUE SHIELD | Source: Ambulatory Visit | Attending: Adult Health | Admitting: Adult Health

## 2014-02-15 ENCOUNTER — Ambulatory Visit (INDEPENDENT_AMBULATORY_CARE_PROVIDER_SITE_OTHER): Payer: BLUE CROSS/BLUE SHIELD | Admitting: Adult Health

## 2014-02-15 ENCOUNTER — Encounter: Payer: Self-pay | Admitting: Adult Health

## 2014-02-15 VITALS — BP 138/90 | HR 76 | Ht 63.5 in | Wt 115.5 lb

## 2014-02-15 DIAGNOSIS — N951 Menopausal and female climacteric states: Secondary | ICD-10-CM

## 2014-02-15 DIAGNOSIS — Z1151 Encounter for screening for human papillomavirus (HPV): Secondary | ICD-10-CM | POA: Insufficient documentation

## 2014-02-15 DIAGNOSIS — Z01419 Encounter for gynecological examination (general) (routine) without abnormal findings: Secondary | ICD-10-CM | POA: Insufficient documentation

## 2014-02-15 DIAGNOSIS — Z1212 Encounter for screening for malignant neoplasm of rectum: Secondary | ICD-10-CM

## 2014-02-15 DIAGNOSIS — J309 Allergic rhinitis, unspecified: Secondary | ICD-10-CM

## 2014-02-15 DIAGNOSIS — J310 Chronic rhinitis: Secondary | ICD-10-CM

## 2014-02-15 HISTORY — DX: Chronic rhinitis: J31.0

## 2014-02-15 LAB — CBC
HCT: 41.9 % (ref 36.0–46.0)
Hemoglobin: 13.6 g/dL (ref 12.0–15.0)
MCH: 29.1 pg (ref 26.0–34.0)
MCHC: 32.5 g/dL (ref 30.0–36.0)
MCV: 89.5 fL (ref 78.0–100.0)
MPV: 12.2 fL (ref 8.6–12.4)
Platelets: 292 10*3/uL (ref 150–400)
RBC: 4.68 MIL/uL (ref 3.87–5.11)
RDW: 14.3 % (ref 11.5–15.5)
WBC: 6.7 10*3/uL (ref 4.0–10.5)

## 2014-02-15 LAB — HEMOCCULT GUIAC POC 1CARD (OFFICE): Fecal Occult Blood, POC: NEGATIVE

## 2014-02-15 MED ORDER — FLUTICASONE PROPIONATE 50 MCG/ACT NA SUSP: NASAL | Status: DC

## 2014-02-15 MED ORDER — ESTRADIOL 0.1 MG/GM VA CREA: 1.0000 | TOPICAL_CREAM | Freq: Every day | VAGINAL | Status: DC

## 2014-02-15 MED ORDER — DOXYCYCLINE HYCLATE 100 MG PO CAPS: 100.0000 mg | ORAL_CAPSULE | Freq: Two times a day (BID) | ORAL | Status: DC

## 2014-02-15 NOTE — Progress Notes (Signed)
Patient ID: Felicia Greer, female   DOB: 09/14/1960, 54 y.o.   MRN: [id number] History of Present Illness: Felicia Greer is a 54 year old white female, married in for pap and physical.She is complaining of vaginal dryness and has sores on chin and in nose and nose sore and sniffs a lot, has been happening over 6 months.She is caring for her Mom and grand kids and husband has RA and has retired.She says she has not taken care of her self.   Current Medications, Allergies, Past Medical History, Past Surgical History, Family History and Social History were reviewed in [facility] electronic medical record.     Review of Systems: patient denies any headaches, blurred vision, shortness of breath, chest pain, abdominal pain, problems with bowel movements, urination, no joint pain or mood swings, she is not having sex, it hurts, see HPI for positives   Physical Exam:BP 138/90 mmHg  Pulse 76  Ht 5' 3.5" (1.613 m)  Wt 115 lb 8 oz (52.39 kg)  BMI 20.14 kg/m2 General:  Well developed, well nourished, no acute distress Skin:  Warm and dry Neck:  Midline trachea, thyroid enlarged, throat mildly red no pustules, has red nasal passages bilaterally and ?pustules and has areas on skin that are red Lungs; Clear to auscultation bilaterally Breast:  No dominant palpable mass, retraction, or nipple discharge Cardiovascular: Regular rate and rhythm Abdomen:  Soft, non tender, no hepatosplenomegaly Pelvic:  External genitalia is normal in appearance, no lesions.  The vagina is atrophic.   The cervix is smooth and atrophic,os stenotic, pap with HPV performed.  Uterus is felt to be normal size, shape, and contour.  No   adnexal masses or tenderness noted. Rectal: Good sphincter tone, no polyps, or hemorrhoids felt.  Hemoccult negative. Extremities:  No swelling or varicosities noted Psych:  No mood changes,alert and cooperative,seems happy Discussed with Dr Despina HiddenEure. Will try estrace vaginal cream and gave her handout on  osphena  Impression: Well woman gyn exam with pap Vaginal dryness Rhinitis with sores    Plan: Return in 2 weeks to assess nose and vagina Physical in 1 year Mammogram now and yearly Check CBC,CMP,TSH and lipids  Rx Flonase 1 spray bid each nostril with 1 refill Rx doxycycline 100 mg 1 bid x 14 days #28 with 1 refill Try estrace vaginal cream small amt on finger nightly in vagina til F/U 2 tubes given, lot # [id number], exp 1/17  If not better refer to ENT

## 2014-02-15 NOTE — Patient Instructions (Signed)
Return  in 2 weeks  Mammogram yearly Physical in 1 year

## 2014-02-16 ENCOUNTER — Telehealth: Payer: Self-pay | Admitting: Adult Health

## 2014-02-16 LAB — COMPREHENSIVE METABOLIC PANEL
ALK PHOS: 74 U/L (ref 39–117)
ALT: 24 U/L (ref 0–35)
AST: 31 U/L (ref 0–37)
Albumin: 4.2 g/dL (ref 3.5–5.2)
BILIRUBIN TOTAL: 0.6 mg/dL (ref 0.2–1.2)
BUN: 6 mg/dL (ref 6–23)
CALCIUM: 9.8 mg/dL (ref 8.4–10.5)
CHLORIDE: 102 meq/L (ref 96–112)
CO2: 31 meq/L (ref 19–32)
Creat: 0.63 mg/dL (ref 0.50–1.10)
Glucose, Bld: 97 mg/dL (ref 70–99)
Potassium: 4.6 mEq/L (ref 3.5–5.3)
SODIUM: 138 meq/L (ref 135–145)
Total Protein: 6.9 g/dL (ref 6.0–8.3)

## 2014-02-16 LAB — LIPID PANEL
CHOLESTEROL: 187 mg/dL (ref 0–200)
HDL: 79 mg/dL (ref >39)
LDL CALC: 93 mg/dL (ref 0–99)
Total CHOL/HDL Ratio: 2.4 Ratio
Triglycerides: 73 mg/dL (ref <150)
VLDL: 15 mg/dL (ref 0–40)

## 2014-02-16 LAB — TSH: TSH: 1.699 u[IU]/mL (ref 0.350–4.500)

## 2014-02-16 LAB — CYTOLOGY - PAP

## 2014-02-16 NOTE — Telephone Encounter (Signed)
Left message labs all good

## 2014-02-18 ENCOUNTER — Encounter: Payer: Self-pay | Admitting: Family Medicine

## 2014-02-18 ENCOUNTER — Ambulatory Visit (INDEPENDENT_AMBULATORY_CARE_PROVIDER_SITE_OTHER): Payer: BLUE CROSS/BLUE SHIELD | Admitting: Family Medicine

## 2014-02-18 VITALS — BP 122/88 | Ht 64.0 in | Wt 119.0 lb

## 2014-02-18 DIAGNOSIS — F419 Anxiety disorder, unspecified: Secondary | ICD-10-CM

## 2014-02-18 DIAGNOSIS — K219 Gastro-esophageal reflux disease without esophagitis: Secondary | ICD-10-CM

## 2014-02-18 DIAGNOSIS — F329 Major depressive disorder, single episode, unspecified: Secondary | ICD-10-CM

## 2014-02-18 DIAGNOSIS — F32A Depression, unspecified: Secondary | ICD-10-CM

## 2014-02-18 DIAGNOSIS — G4700 Insomnia, unspecified: Secondary | ICD-10-CM

## 2014-02-18 MED ORDER — ALPRAZOLAM 1 MG PO TABS: ORAL_TABLET | ORAL | Status: DC

## 2014-02-18 MED ORDER — ALBUTEROL SULFATE HFA 108 (90 BASE) MCG/ACT IN AERS: 2.0000 | INHALATION_SPRAY | RESPIRATORY_TRACT | Status: DC | PRN

## 2014-02-18 MED ORDER — ESCITALOPRAM OXALATE 10 MG PO TABS: 10.0000 mg | ORAL_TABLET | Freq: Every day | ORAL | Status: DC

## 2014-02-18 NOTE — Progress Notes (Signed)
   Subjective:    Patient ID: Felicia Greer, female    DOB: 05/05/1960, 54 y.o.   MRN: [id number]  HPI Patient is here today to get a refill on her xanax.  She has been taking at least 1 tab at night and sometimes a half tab during the day.  She is under a lot of stress here recently.  Pt is taking care of her mother (who is bipolar) and 4 grandchildren, with another one on the way. They all live with her.    Pt not exercising and under a lot of stress  Patient feels down at times. No suicidal thoughts. No homicidal thoughts. Some trouble sleeping at times. Not exercising. Review of Systems No headache no chest pain no back pain no abdominal pain no change in bowel habits no blood in stool    Objective:   Physical Exam   Alert no acute distress anxious appearing vital stable lungs clear heart regular in rhythm ankles without edema     Assessment & Plan:  Impression insomnia #2 depression discussed #3 generalized anxiety disorder complicated by stress #4. Known COPD. Thankfully no longer smoking. history of reactive airways patient request refill of albuterol plan initiate Lexapro. Side effects benefits discussed. Maintain Xanax. Exercise encourage. Ventolin refilled. 25 minutes spent most in discussion. Recheck in 6 months. WSL

## 2014-02-20 DIAGNOSIS — F32A Depression, unspecified: Secondary | ICD-10-CM | POA: Insufficient documentation

## 2014-02-20 DIAGNOSIS — F329 Major depressive disorder, single episode, unspecified: Secondary | ICD-10-CM | POA: Insufficient documentation

## 2014-03-01 ENCOUNTER — Ambulatory Visit (INDEPENDENT_AMBULATORY_CARE_PROVIDER_SITE_OTHER): Payer: BLUE CROSS/BLUE SHIELD | Admitting: Adult Health

## 2014-03-01 ENCOUNTER — Ambulatory Visit (HOSPITAL_COMMUNITY)
Admission: RE | Admit: 2014-03-01 | Discharge: 2014-03-01 | Disposition: A | Discharge status: Home / Self Care | Payer: BLUE CROSS/BLUE SHIELD | Source: Ambulatory Visit | Attending: Adult Health | Admitting: Adult Health

## 2014-03-01 ENCOUNTER — Encounter: Payer: Self-pay | Admitting: Adult Health

## 2014-03-01 VITALS — BP 120/80 | Ht 63.0 in | Wt 117.5 lb

## 2014-03-01 DIAGNOSIS — N951 Menopausal and female climacteric states: Secondary | ICD-10-CM

## 2014-03-01 DIAGNOSIS — M545 Low back pain, unspecified: Secondary | ICD-10-CM

## 2014-03-01 DIAGNOSIS — J309 Allergic rhinitis, unspecified: Secondary | ICD-10-CM

## 2014-03-01 DIAGNOSIS — M549 Dorsalgia, unspecified: Secondary | ICD-10-CM | POA: Insufficient documentation

## 2014-03-01 DIAGNOSIS — R102 Pelvic and perineal pain: Secondary | ICD-10-CM | POA: Insufficient documentation

## 2014-03-01 HISTORY — DX: Pelvic and perineal pain: R10.2

## 2014-03-01 NOTE — Progress Notes (Signed)
Subjective:     Patient ID: Felicia Greer, female   DOB: 10/16/1960, 54 y.o.   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 Reviewed medications and allergies.     Objective:   Physical Exam BP 120/80 mmHg  Ht 5\' 3"  (1.6 m)  Wt 117 lb 8 oz (53.298 kg)  BMI 20.82 kg/m2   Nostrils clear, less red and no sores seen, Skin warm and dry.Pelvic: external genitalia is normal in appearance no lesions, vagina: pinker with better moisture,urethra has no lesions or masses noted, cervix:smooth, uterus: normal size, shape and contour, non tender, no masses felt, adnexa: no masses, some low tenderness noted. Bladder is non tender and no masses felt.No CVAT noted, but has discomfort low back, no radiation down legs.Discussed with her, will get US to assess ovaries and lumbar xray on back, could be osteoarthritis.  Assessment:     Pelvic pain Low back pain  Resolved rhinitis Vaginal dryness improving     Plan:     Lumbar spine xray now Return in 1 week for gyn US and see me  Continue flonase prn Use estrace 2-3 x week and then use good lubricate with sex   Review handout on pelvic pain and back pain

## 2014-03-01 NOTE — Patient Instructions (Signed)
Back Pain, Adult Low back pain is very common. About 1 in 5 people have back pain.The cause of low back pain is rarely dangerous. The pain often gets better over time.About half of people with a sudden onset of back pain feel better in just 2 weeks. About 8 in 10 people feel better by 6 weeks.  CAUSES Some common causes of back pain include:  Strain of the muscles or ligaments supporting the spine.  Wear and tear (degeneration) of the spinal discs.  Arthritis.  Direct injury to the back. DIAGNOSIS Most of the time, the direct cause of low back pain is not known.However, back pain can be treated effectively even when the exact cause of the pain is unknown.Answering your caregiver's questions about your overall health and symptoms is one of the most accurate ways to make sure the cause of your pain is not dangerous. If your caregiver needs more information, he or she may order lab work or imaging tests (X-rays or MRIs).However, even if imaging tests show changes in your back, this usually does not require surgery. HOME CARE INSTRUCTIONS For many people, back pain returns.Since low back pain is rarely dangerous, it is often a condition that people can learn to manageon their own.   Remain active. It is stressful on the back to sit or stand in one place. Do not sit, drive, or stand in one place for more than 30 minutes at a time. Take short walks on level surfaces as soon as pain allows.Try to increase the length of time you walk each day.  Do not stay in bed.Resting more than 1 or 2 days can delay your recovery.  Do not avoid exercise or work.Your body is made to move.It is not dangerous to be active, even though your back may hurt.Your back will likely heal faster if you return to being active before your pain is gone.  Pay attention to your body when you bend and lift. Many people have less discomfortwhen lifting if they bend their knees, keep the load close to their bodies,and  avoid twisting. Often, the most comfortable positions are those that put less stress on your recovering back.  Find a comfortable position to sleep. Use a firm mattress and lie on your side with your knees slightly bent. If you lie on your back, put a pillow under your knees.  Only take over-the-counter or prescription medicines as directed by your caregiver. Over-the-counter medicines to reduce pain and inflammation are often the most helpful.Your caregiver may prescribe muscle relaxant drugs.These medicines help dull your pain so you can more quickly return to your normal activities and healthy exercise.  Put ice on the injured area.  Put ice in a plastic bag.  Place a towel between your skin and the bag.  Leave the ice on for 15-20 minutes, 03-04 times a day for the first 2 to 3 days. After that, ice and heat may be alternated to reduce pain and spasms.  Ask your caregiver about trying back exercises and gentle massage. This may be of some benefit.  Avoid feeling anxious or stressed.Stress increases muscle tension and can worsen back pain.It is important to recognize when you are anxious or stressed and learn ways to manage it.Exercise is a great option. SEEK MEDICAL CARE IF:  You have pain that is not relieved with rest or medicine.  You have pain that does not improve in 1 week.  You have new symptoms.  You are generally not feeling well. SEEK   IMMEDIATE MEDICAL CARE IF:   You have pain that radiates from your back into your legs.  You develop new bowel or bladder control problems.  You have unusual weakness or numbness in your arms or legs.  You develop nausea or vomiting.  You develop abdominal pain.  You feel faint. Document Released: 01/07/2005 Document Revised: 07/09/2011 Document Reviewed: 05/11/2013 [facility] Patient Information 2015 [place], [place]. This information is not intended to replace advice given to you by your health care provider. Make sure you  discuss any questions you have with your health care provider. Pelvic Pain Female pelvic pain can be caused by many different things and start from a variety of places. Pelvic pain refers to pain that is located in the lower half of the abdomen and between your hips. The pain may occur over a short period of time (acute) or may be reoccurring (chronic). The cause of pelvic pain may be related to disorders affecting the female reproductive organs (gynecologic), but it may also be related to the bladder, kidney stones, an intestinal complication, or muscle or skeletal problems. Getting help right away for pelvic pain is important, especially if there has been severe, sharp, or a sudden onset of unusual pain. It is also important to get help right away because some types of pelvic pain can be life threatening.  CAUSES  Below are only some of the causes of pelvic pain. The causes of pelvic pain can be in one of several categories.   Gynecologic.  Pelvic inflammatory disease.  Sexually transmitted infection.  Ovarian cyst or a twisted ovarian ligament (ovarian torsion).  Uterine lining that grows outside the uterus (endometriosis).  Fibroids, cysts, or tumors.  Ovulation.  Pregnancy.  Pregnancy that occurs outside the uterus (ectopic pregnancy).  Miscarriage.  Labor.  Abruption of the placenta or ruptured uterus.  Infection.  Uterine infection (endometritis).  Bladder infection.  Diverticulitis.  Miscarriage related to a uterine infection (septic abortion).  Bladder.  Inflammation of the bladder (cystitis).  Kidney stone(s).  Gastrointestinal.  Constipation.  Diverticulitis.  Neurologic.  Trauma.  Feeling pelvic pain because of mental or emotional causes (psychosomatic).  Cancers of the bowel or pelvis. EVALUATION  Your caregiver will want to take a careful history of your concerns. This includes recent changes in your health, a careful gynecologic history of  your periods (menses), and a sexual history. Obtaining your family history and medical history is also important. Your caregiver may suggest a pelvic exam. A pelvic exam will help identify the location and severity of the pain. It also helps in the evaluation of which organ system may be involved. In order to identify the cause of the pelvic pain and be properly treated, your caregiver may order tests. These tests may include:   A pregnancy test.  Pelvic ultrasonography.  An X-ray exam of the abdomen.  A urinalysis or evaluation of vaginal discharge.  Blood tests. HOME CARE INSTRUCTIONS   Only take over-the-counter or prescription medicines for pain, discomfort, or fever as directed by your caregiver.   Rest as directed by your caregiver.   Eat a balanced diet.   Drink enough fluids to make your urine clear or pale yellow, or as directed.   Avoid sexual intercourse if it causes pain.   Apply warm or cold compresses to the lower abdomen depending on which one helps the pain.   Avoid stressful situations.   Keep a journal of your pelvic pain. Write down when it started, where the pain is  located, and if there are things that seem to be associated with the pain, such as food or your menstrual cycle.  Follow up with your caregiver as directed.  SEEK MEDICAL CARE IF:  Your medicine does not help your pain.  You have abnormal vaginal discharge. SEEK IMMEDIATE MEDICAL CARE IF:   You have heavy bleeding from the vagina.   Your pelvic pain increases.   You feel light-headed or faint.   You have chills.   You have pain with urination or blood in your urine.   You have uncontrolled diarrhea or vomiting.   You have a fever or persistent symptoms for more than 3 days.  You have a fever and your symptoms suddenly get worse.   You are being physically or sexually abused.  MAKE SURE YOU:  Understand these instructions.  Will watch your condition.  Will get  help if you are not doing well or get worse. Document Released: 12/05/2003 Document Revised: 05/24/2013 Document Reviewed: 04/29/2011 [facility] Patient Information 2015 [place]. This information is not intended to replace advice given to you by your health care provider. Make sure you discuss any questions you have with your health care provider. Return in 1 week for [place]

## 2014-03-03 ENCOUNTER — Telehealth: Payer: Self-pay | Admitting: Adult Health

## 2014-03-03 NOTE — Telephone Encounter (Signed)
Pt aware spine xray showed degenerative changes and osteopenia, take vitamin D 5000 iu daily and calcium 1200 mg daily, may add Actonel in future

## 2014-03-09 ENCOUNTER — Other Ambulatory Visit: Payer: Self-pay | Admitting: Family Medicine

## 2014-03-09 ENCOUNTER — Ambulatory Visit (INDEPENDENT_AMBULATORY_CARE_PROVIDER_SITE_OTHER): Payer: BLUE CROSS/BLUE SHIELD

## 2014-03-09 DIAGNOSIS — R102 Pelvic and perineal pain: Secondary | ICD-10-CM

## 2014-03-10 ENCOUNTER — Encounter: Payer: Self-pay | Admitting: Adult Health

## 2014-03-10 ENCOUNTER — Ambulatory Visit (INDEPENDENT_AMBULATORY_CARE_PROVIDER_SITE_OTHER): Payer: BLUE CROSS/BLUE SHIELD | Admitting: Adult Health

## 2014-03-10 VITALS — BP 96/70 | Ht 63.0 in | Wt 114.0 lb

## 2014-03-10 DIAGNOSIS — R102 Pelvic and perineal pain: Secondary | ICD-10-CM

## 2014-03-10 DIAGNOSIS — M545 Low back pain, unspecified: Secondary | ICD-10-CM

## 2014-03-10 DIAGNOSIS — M8580 Other specified disorders of bone density and structure, unspecified site: Secondary | ICD-10-CM

## 2014-03-10 HISTORY — DX: Other specified disorders of bone density and structure, unspecified site: M85.80

## 2014-03-10 MED ORDER — RISEDRONATE SODIUM 150 MG PO TABS: 150.0000 mg | ORAL_TABLET | ORAL | Status: DC

## 2014-03-10 NOTE — Patient Instructions (Signed)
Take actonel 1 x monthly if back not better call will refer Continue estrace  Follow up prn

## 2014-03-10 NOTE — Telephone Encounter (Signed)
Ok plus 4 monthly ref

## 2014-03-10 NOTE — Progress Notes (Signed)
Subjective:     Patient ID: Lorelei PontSherry Sumlin, female   DOB: 04/27/1960, 54 y.o.   MRN: [id number]  HPI [name] is a 54 year old white female in complains of low back pain,had lumbar spine showing degenerative changes and osteopenia and was started on calcium and vitamin D.She has had pelvic pain that has resolved and had a normal gyn US yesterday.Complains of thin dry top eye lids.  Review of Systems +back pain, all other systems negative today  Reviewed past medical,surgical, social and family history. Reviewed medications and allergies.     Objective:   Physical Exam BP 96/70 mmHg  Ht 5\' 3"  (1.6 m)  Wt 114 lb (51.71 kg)  BMI 20.20 kg/m2   Skin warm and dry, abdomen soft, non tender, no HSM, has some tenderness low in back with no radiation, discussed adding actonel and she wants to try to see if helps, if it does not will refer to orthopedists.She is happy with estrace cream and how vagina feels now, still has not had sex, husband has been sick. Top lids: has make up on today, told to see dermatologists without makeup on.  Assessment:     Low back pain Osteopenia  Resolved pelvic pain    Plan:     Rx actonel 150 mg #1 1 po monthly with 12 refills, call me after 3 months and if not better will refer   Follow up prn Continue estrace

## 2014-03-10 NOTE — Telephone Encounter (Signed)
Last seen 12/30/14

## 2014-04-12 ENCOUNTER — Telehealth: Payer: Self-pay | Admitting: Adult Health

## 2014-04-12 NOTE — Telephone Encounter (Signed)
Spoke with pt. Pt has taken 2 rounds of meds for rhinitis. She has blisters inside nose. What do you advise? Thanks!! JSY?

## 2014-04-12 NOTE — Telephone Encounter (Signed)
Left message that she can see ENT and numbers given 2 ENTs

## 2014-04-21 ENCOUNTER — Ambulatory Visit (INDEPENDENT_AMBULATORY_CARE_PROVIDER_SITE_OTHER): Payer: BLUE CROSS/BLUE SHIELD | Admitting: Otolaryngology

## 2014-04-21 DIAGNOSIS — L0101 Non-bullous impetigo: Secondary | ICD-10-CM | POA: Diagnosis not present

## 2014-05-19 ENCOUNTER — Ambulatory Visit (INDEPENDENT_AMBULATORY_CARE_PROVIDER_SITE_OTHER): Payer: BLUE CROSS/BLUE SHIELD | Admitting: Otolaryngology

## 2014-05-19 DIAGNOSIS — J310 Chronic rhinitis: Secondary | ICD-10-CM

## 2014-05-20 ENCOUNTER — Ambulatory Visit: Payer: BLUE CROSS/BLUE SHIELD | Admitting: Family Medicine

## 2014-05-25 ENCOUNTER — Ambulatory Visit: Payer: BLUE CROSS/BLUE SHIELD | Admitting: Family Medicine

## 2014-06-10 ENCOUNTER — Other Ambulatory Visit: Payer: Self-pay | Admitting: Family Medicine

## 2014-06-10 DIAGNOSIS — Z1231 Encounter for screening mammogram for malignant neoplasm of breast: Secondary | ICD-10-CM

## 2014-06-27 ENCOUNTER — Inpatient Hospital Stay (HOSPITAL_COMMUNITY): Admission: RE | Admit: 2014-06-27 | Payer: BLUE CROSS/BLUE SHIELD | Source: Ambulatory Visit

## 2014-08-22 ENCOUNTER — Other Ambulatory Visit: Payer: Self-pay | Admitting: Family Medicine

## 2014-09-23 ENCOUNTER — Other Ambulatory Visit: Payer: Self-pay | Admitting: Family Medicine

## 2014-09-23 NOTE — Telephone Encounter (Signed)
30 d worth pt should be seen every six mo with a ll her challneges

## 2014-11-10 ENCOUNTER — Ambulatory Visit (HOSPITAL_COMMUNITY)
Admission: RE | Admit: 2014-11-10 | Discharge: 2014-11-10 | Disposition: A | Discharge status: Home / Self Care | Payer: BLUE CROSS/BLUE SHIELD | Source: Ambulatory Visit | Attending: Internal Medicine | Admitting: Internal Medicine

## 2014-11-10 ENCOUNTER — Other Ambulatory Visit (HOSPITAL_COMMUNITY): Payer: Self-pay | Admitting: Internal Medicine

## 2014-11-10 DIAGNOSIS — Z1231 Encounter for screening mammogram for malignant neoplasm of breast: Secondary | ICD-10-CM

## 2014-12-21 ENCOUNTER — Ambulatory Visit (INDEPENDENT_AMBULATORY_CARE_PROVIDER_SITE_OTHER): Payer: BLUE CROSS/BLUE SHIELD | Admitting: Cardiovascular Disease

## 2014-12-21 ENCOUNTER — Encounter: Payer: Self-pay | Admitting: Cardiovascular Disease

## 2014-12-21 VITALS — BP 121/84 | HR 89 | Ht 63.0 in | Wt 118.0 lb

## 2014-12-21 DIAGNOSIS — R42 Dizziness and giddiness: Secondary | ICD-10-CM

## 2014-12-21 DIAGNOSIS — Z136 Encounter for screening for cardiovascular disorders: Secondary | ICD-10-CM

## 2014-12-21 DIAGNOSIS — R51 Headache: Secondary | ICD-10-CM | POA: Diagnosis not present

## 2014-12-21 DIAGNOSIS — R55 Syncope and collapse: Secondary | ICD-10-CM

## 2014-12-21 DIAGNOSIS — R519 Headache, unspecified: Secondary | ICD-10-CM

## 2014-12-21 NOTE — Patient Instructions (Signed)
Your physician recommends that you schedule a follow-up appointment AS NEEDED WITH DR. KONESWARAN  Your physician recommPurvis Sheffieldends that you continue on your current medications as directed. Please refer to the Current Medication list given to you today.  You have been referred to NEUROLOGY  [facility]

## 2014-12-21 NOTE — Progress Notes (Signed)
Patient ID: Felicia Greer, female   DOB: March 19, 1960, 54 y.o.   MRN: [id number]       CARDIOLOGY CONSULT NOTE  Patient ID: Felicia Greer MRN: [id number] DOB/AGE: [birth year] 54 y.o.  Admit date: (Not on file) Primary Physician Lubertha South, MD  Reason for Consultation: syncope  HPI: The patient is a 54 year old woman with a history of chronic anxiety in lower back pain who is referred for the evaluation of syncope. Orthostatics were performed today and were found to be normal. ECG performed at PCPs office showed sinus bradycardia, HR 54 bpm, with no ischemic ST segment or T-wave abnormalities, nor any arrhythmias.  ECG performed in the office today demonstrates normal sinus rhythm with no ischemic ST segment or T-wave abnormalities, nor any arrhythmias.   Her most recent syncopal episode occurred in August 2016. She said she has had severe headaches she experiences prior to passing out associated with weakness and lightheadedness. She denies antecedent chest pain nor palpitations and shortness of breath. Today she complains of severe headaches, lightheadedness, and her lips feeling numb. In August, she had been walking into the kitchen when she suddenly passed out and fell on her face. Her husband assisted her to the bathroom where she passed out again. She does not think she passed out for that long.   Her insurance runs out at the end of next month. She has reportedly not had a neurologic evaluation. I do not have any recent blood work. She was told her blood sugar was low in the 40s range. She was instructed by her PCP to eat 6 small meals a day.    Allergies  Allergen Reactions  . Codeine Nausea Only  . Metrogel [Metronidazole]     Had local reaction  . Protonix [Pantoprazole Sodium] Rash    Current Outpatient Prescriptions  Medication Sig Dispense Refill  . albuterol (PROVENTIL HFA;VENTOLIN HFA) 108 (90 BASE) MCG/ACT inhaler Inhale 2 puffs into the lungs every 4 (four) hours as  needed for wheezing. 1 Inhaler 2  . ALPRAZolam (XANAX) 1 MG tablet Take 1 tablet by mouth 3 (three) times daily.  0  . Calcium Carbonate-Vit D-Min (CALCIUM 1200 PO) Take by mouth daily.    . Cholecalciferol (VITAMIN D3) 5000 UNITS CAPS Take 1 capsule by mouth daily.    Marland Kitchen escitalopram (LEXAPRO) 10 MG tablet take 1 tablet by mouth once daily 30 tablet 0  . HYDROcodone-acetaminophen (NORCO/VICODIN) 5-325 MG tablet Take 1 tablet by mouth 4 (four) times daily as needed.  0  . meloxicam (MOBIC) 7.5 MG tablet Take 1 tablet by mouth 2 (two) times daily as needed.   0  . Multiple Vitamins-Minerals (MULTIVITAMIN PO) Take 1 tablet by mouth daily.    Marland Kitchen omeprazole (PRILOSEC) 40 MG capsule TAKE 1 CAPSULE BY MOUTH EVERY MORNING (Patient taking differently: as needed. ) 30 capsule 5  . risedronate (ACTONEL) 150 MG tablet Take 1 tablet (150 mg total) by mouth every 30 (thirty) days. with water on empty stomach, nothing by mouth or lie down for next 30 minutes. 1 tablet 12   No current facility-administered medications for this visit.    Past Medical History  Diagnosis Date  . Acid reflux   . Hx: UTI (urinary tract infection)   . Hx of goiter   . BV (bacterial vaginosis) 4 18 2012  . Arthritis     pelvic bone  . COPD (chronic obstructive pulmonary disease) (HCC) 2010  . Rhinitis 02/15/2014  . Back pain   .  Pelvic pain in female 03/01/2014  . Osteopenia determined by x-ray 03/10/2014    Past Surgical History  Procedure Laterality Date  . Breast biopsy Right in the 1990s  . Tubal ligation    . Cervical fusion N/A in the 1990s  . Biopsy thyroid N/A 06/2009    had fna  . Hysteroscopy w/ endometrial ablation N/A 2004  . Breast biopsy  1980    benign    Social History   Social History  . Marital Status: Married    Spouse Name: N/A  . Number of Children: N/A  . Years of Education: N/A   Occupational History  . Not on file.   Social History Main Topics  . Smoking status: Former Smoker -- 2.00  packs/day for 38 years    Types: Cigarettes    Start date: 10/25/1972    Quit date: 12/21/2010  . Smokeless tobacco: Never Used     Comment: use electronic cigs  . Alcohol Use: No  . Drug Use: No  . Sexual Activity:    Partners: Male    Birth Control/ Protection: Surgical     Comment: ablation   Other Topics Concern  . Not on file   Social History Narrative     No family history of premature CAD in 1st degree relatives.  Prior to Admission medications   Medication Sig Start Date End Date Taking? Authorizing Provider  albuterol (PROVENTIL HFA;VENTOLIN HFA) 108 (90 BASE) MCG/ACT inhaler Inhale 2 puffs into the lungs every 4 (four) hours as needed for wheezing. 02/18/14  Yes Merlyn Albert, MD  ALPRAZolam Prudy Feeler) 1 MG tablet Take 1 tablet by mouth 3 (three) times daily. 11/19/14  Yes Historical Provider, MD  Calcium Carbonate-Vit D-Min (CALCIUM 1200 PO) Take by mouth daily.   Yes Historical Provider, MD  Cholecalciferol (VITAMIN D3) 5000 UNITS CAPS Take 1 capsule by mouth daily.   Yes Historical Provider, MD  escitalopram (LEXAPRO) 10 MG tablet take 1 tablet by mouth once daily 09/23/14  Yes Merlyn Albert, MD  HYDROcodone-acetaminophen (NORCO/VICODIN) 5-325 MG tablet Take 1 tablet by mouth 4 (four) times daily as needed. 11/24/14  Yes Historical Provider, MD  meloxicam (MOBIC) 7.5 MG tablet Take 1 tablet by mouth 2 (two) times daily as needed.  11/19/14  Yes Historical Provider, MD  Multiple Vitamins-Minerals (MULTIVITAMIN PO) Take 1 tablet by mouth daily.   Yes Historical Provider, MD  omeprazole (PRILOSEC) 40 MG capsule TAKE 1 CAPSULE BY MOUTH EVERY MORNING Patient taking differently: as needed.  02/16/13  Yes Merlyn Albert, MD  risedronate (ACTONEL) 150 MG tablet Take 1 tablet (150 mg total) by mouth every 30 (thirty) days. with water on empty stomach, nothing by mouth or lie down for next 30 minutes. 03/10/14  Yes Adline Potter, NP     Review of systems complete and  found to be negative unless listed above in HPI     Physical exam Blood pressure 121/84, pulse 89, height  (1.6 m), weight 118 lb (53.524 kg).   BP 130/92 123/87 121/84 Pulse 84 90 89 (orthostatics)  General: NAD Neck: No JVD, no thyromegaly or thyroid nodule.  Lungs: Clear to auscultation bilaterally with normal respiratory effort. CV: Nondisplaced PMI. Regular rate and rhythm, normal S1/S2, no S3/S4, no murmur.  No peripheral edema.  No carotid bruit.  Normal pedal pulses.  Abdomen: Soft, nontender, no hepatosplenomegaly, no distention.  Skin: Intact without lesions or rashes.  Neurologic: Alert and oriented x 3.  Psych:  Normal affect. Extremities: No clubbing or cyanosis.  HEENT: Normal.   ECG: Most recent ECG reviewed.  Labs:   Lab Results  Component Value Date   WBC 6.7 02/15/2014   HGB 13.6 02/15/2014   HCT 41.9 02/15/2014   MCV 89.5 02/15/2014   PLT 292 02/15/2014   No results for input(s): NA, K, CL, CO2, BUN, CREATININE, CALCIUM, PROT, BILITOT, ALKPHOS, ALT, AST, GLUCOSE in the last 168 hours.  Invalid input(s): LABALBU Lab Results  Component Value Date   CKTOTAL 81 01/09/2008   CKMB 3.0 01/09/2008   TROPONINI <0.30 10/07/2011    Lab Results  Component Value Date   CHOL 187 02/15/2014   Lab Results  Component Value Date   HDL 79 02/15/2014   Lab Results  Component Value Date   LDLCALC 93 02/15/2014   Lab Results  Component Value Date   TRIG 73 02/15/2014   Lab Results  Component Value Date   CHOLHDL 2.4 02/15/2014   No results found for: LDLDIRECT       Studies: No results found.  ASSESSMENT AND PLAN:  1. Syncope: Does not appear to be cardiac in etiology. May be indicative of common faint. No recurrences since August. Possibly related to low blood sugar. Orthostatics were normal. No indication for noninvasive testing at this time. Does have severe headaches associated with lightheadedness. I will make a neurology referral to  expedite the process, given that insurance runs out in one month.  2. Severe headaches and lightheadedness:  I will make a neurology referral to expedite the process, given that insurance runs out in one month. She may need neuroimaging.  Dispo: f/u prn.    Signed: Prentice DockerSuresh Koneswaran, M.D., F.A.C.C.  12/21/2014, 9:48 AM

## 2015-01-05 ENCOUNTER — Encounter: Payer: Self-pay | Admitting: Adult Health

## 2015-01-05 ENCOUNTER — Ambulatory Visit (INDEPENDENT_AMBULATORY_CARE_PROVIDER_SITE_OTHER): Payer: BLUE CROSS/BLUE SHIELD | Admitting: Adult Health

## 2015-01-05 VITALS — BP 122/70 | HR 84 | Ht 63.0 in | Wt 119.0 lb

## 2015-01-05 DIAGNOSIS — N941 Unspecified dyspareunia: Secondary | ICD-10-CM | POA: Diagnosis not present

## 2015-01-05 DIAGNOSIS — N9489 Other specified conditions associated with female genital organs and menstrual cycle: Secondary | ICD-10-CM

## 2015-01-05 DIAGNOSIS — R350 Frequency of micturition: Secondary | ICD-10-CM

## 2015-01-05 DIAGNOSIS — B9689 Other specified bacterial agents as the cause of diseases classified elsewhere: Secondary | ICD-10-CM

## 2015-01-05 DIAGNOSIS — N760 Acute vaginitis: Secondary | ICD-10-CM

## 2015-01-05 DIAGNOSIS — R351 Nocturia: Secondary | ICD-10-CM

## 2015-01-05 DIAGNOSIS — N898 Other specified noninflammatory disorders of vagina: Secondary | ICD-10-CM

## 2015-01-05 DIAGNOSIS — A499 Bacterial infection, unspecified: Secondary | ICD-10-CM | POA: Diagnosis not present

## 2015-01-05 HISTORY — DX: Other specified noninflammatory disorders of vagina: N89.8

## 2015-01-05 HISTORY — DX: Unspecified dyspareunia: N94.10

## 2015-01-05 HISTORY — DX: Frequency of micturition: R35.0

## 2015-01-05 LAB — POCT URINALYSIS DIPSTICK
Blood, UA: NEGATIVE
Glucose, UA: NEGATIVE
LEUKOCYTES UA: NEGATIVE
Nitrite, UA: NEGATIVE
Protein, UA: NEGATIVE

## 2015-01-05 LAB — POCT WET PREP (WET MOUNT)
CLUE CELLS WET PREP WHIFF POC: POSITIVE
WBC, Wet Prep HPF POC: POSITIVE

## 2015-01-05 MED ORDER — METRONIDAZOLE 500 MG PO TABS: 500.0000 mg | ORAL_TABLET | Freq: Two times a day (BID) | ORAL | Status: DC

## 2015-01-05 MED ORDER — MIRABEGRON ER 25 MG PO TB24: 25.0000 mg | ORAL_TABLET | Freq: Every day | ORAL | Status: DC

## 2015-01-05 NOTE — Patient Instructions (Signed)
Bacterial Vaginosis Bacterial vaginosis is a vaginal infection that occurs when the normal balance of bacteria in the vagina is disrupted. It results from an overgrowth of certain bacteria. This is the most common vaginal infection in women of childbearing age. Treatment is important to prevent complications, especially in pregnant women, as it can cause a premature delivery. CAUSES  Bacterial vaginosis is caused by an increase in harmful bacteria that are normally present in smaller amounts in the vagina. Several different kinds of bacteria can cause bacterial vaginosis. However, the reason that the condition develops is not fully understood. RISK FACTORS Certain activities or behaviors can put you at an increased risk of developing bacterial vaginosis, including:  Having a new sex partner or multiple sex partners.  Douching.  Using an intrauterine device (IUD) for contraception. Women do not get bacterial vaginosis from toilet seats, bedding, swimming pools, or contact with objects around them. SIGNS AND SYMPTOMS  Some women with bacterial vaginosis have no signs or symptoms. Common symptoms include:  Grey vaginal discharge.  A fishlike odor with discharge, especially after sexual intercourse.  Itching or burning of the vagina and vulva.  Burning or pain with urination. DIAGNOSIS  Your health care provider will take a medical history and examine the vagina for signs of bacterial vaginosis. A sample of vaginal fluid may be taken. Your health care provider will look at this sample under a microscope to check for bacteria and abnormal cells. A vaginal pH test may also be done.  TREATMENT  Bacterial vaginosis may be treated with antibiotic medicines. These may be given in the form of a pill or a vaginal cream. A second round of antibiotics may be prescribed if the condition comes back after treatment. Because bacterial vaginosis increases your risk for sexually transmitted diseases, getting  treated can help reduce your risk for chlamydia, gonorrhea, HIV, and herpes. HOME CARE INSTRUCTIONS   Only take over-the-counter or prescription medicines as directed by your health care provider.  If antibiotic medicine was prescribed, take it as directed. Make sure you finish it even if you start to feel better.  Tell all sexual partners that you have a vaginal infection. They should see their health care provider and be treated if they have problems, such as a mild rash or itching.  During treatment, it is important that you follow these instructions:  Avoid sexual activity or use condoms correctly.  Do not douche.  Avoid alcohol as directed by your health care provider.  Avoid breastfeeding as directed by your health care provider. SEEK MEDICAL CARE IF:   Your symptoms are not improving after 3 days of treatment.  You have increased discharge or pain.  You have a fever. MAKE SURE YOU:   Understand these instructions.  Will watch your condition.  Will get help right away if you are not doing well or get worse. FOR MORE INFORMATION  Centers for Disease Control and Prevention, Division of STD Prevention: SolutionApps.co.zawww.cdc.gov/std American Sexual Health Association (ASHA): www.ashastd.org    This information is not intended to replace advice given to you by your health care provider. Make sure you discuss any questions you have with your health care provider.   Document Released: 01/07/2005 Document Revised: 01/28/2014 Document Reviewed: 08/19/2012 Elsevier Interactive Patient Education [employer] Inc. Follow up in 4 weeks

## 2015-01-05 NOTE — Progress Notes (Signed)
Subjective:     Patient ID: Felicia Greer, female   DOB: 11/13/1960, 54 y.o.   MRN: [id number]  HPI [name] is a 54 year old white female, married in complaining of vaginal discharge with odor adn urinary frequency, and up at night 3-4 times to pee, last time had sex had pain but it has been a while and husband has issues, had used luvena in past.  Review of Systems Patient denies any headaches, hearing loss, fatigue, blurred vision, shortness of breath, chest pain, abdominal pain, problems with bowel movements. No joint pain or mood swings.See HPI for positives. Reviewed past medical,surgical, social and family history. Reviewed medications and allergies.     Objective:   Physical Exam BP 122/70 mmHg  Pulse 84  Ht 5\' 3"  (1.6 m)  Wt 119 lb (53.978 kg)  BMI 21.09 kg/m2    Urine negative, Skin warm and dry.Pelvic: external genitalia is normal in appearance no lesions, vagina: tan discharge with odor,atrophic changes,urethra has no lesions or masses noted, cervix:smooth, uterus: normal size, shape and contour, non tender, no masses felt, adnexa: no masses or tenderness noted. Bladder is non tender and no masses felt. Wet prep: + for clue cells and +WBCs.Discussed maybe trying vaginal estrogen, to think about it and will discuss more at follow up appt.  Assessment:    Vaginal discharge Vaginal odor BV Urinary frequency Nocturia dyspareunia     Plan:      Rx flagyl 500 mg 1 bid x 7 days, no alcohol, review handout on BV   Rx myrbetriq 25 mg #30 take 1 daily with 3 refills Follow up in 4 weeks

## 2015-01-10 ENCOUNTER — Ambulatory Visit (INDEPENDENT_AMBULATORY_CARE_PROVIDER_SITE_OTHER): Payer: BLUE CROSS/BLUE SHIELD | Admitting: Neurology

## 2015-01-10 ENCOUNTER — Encounter: Payer: Self-pay | Admitting: Neurology

## 2015-01-10 VITALS — BP 140/90 | HR 79 | Ht 64.0 in | Wt 121.0 lb

## 2015-01-10 DIAGNOSIS — R51 Headache: Secondary | ICD-10-CM | POA: Diagnosis not present

## 2015-01-10 DIAGNOSIS — G4486 Cervicogenic headache: Secondary | ICD-10-CM

## 2015-01-10 DIAGNOSIS — M542 Cervicalgia: Secondary | ICD-10-CM

## 2015-01-10 MED ORDER — TIZANIDINE HCL 2 MG PO TABS
2.0000 mg | ORAL_TABLET | Freq: Three times a day (TID) | ORAL | Status: DC | PRN
Start: 2015-01-10 — End: 2018-12-01

## 2015-01-10 NOTE — Progress Notes (Signed)
Chart forwarded.  

## 2015-01-10 NOTE — Progress Notes (Signed)
NEUROLOGY CONSULTATION NOTE  Abbagale Goguen MRN: [id number] DOB: 02/26/1960  Referring provider: Dr. Purvis Sheffield Primary care provider: Dr. Gerda Diss  Reason for consult:  headache  HISTORY OF PRESENT ILLNESS: Felicia Greer is a 54 year old right-handed woman with low back pain who presents for headache.  History obtained by patient and her husband.  She started having headaches 3 months ago.  It is a shooting pain that starts in the back of the neck and travels up to the front of the head.  It is bilateral but also notes left sided headache as well.  It is of gradual onset and gets worse as the day progresses.  It has been occuring almost daily.  When it is severe, she rarely notes nausea, photophobia and phonophobia.  She tried hydrocodone, which was ineffective.  She denies any prior head or neck trauma.  She does have stress as she frequently cares for her young grandchildren and often will be carrying the babies in her arm.    She has chronic neck pain and had cervical spinal fusion many years ago.  She has residual numbness in her right thumb as a result of the surgery.  She notes pain in the right medial upper arm, radiating down to the elbow.  She denies hand weakness.  She denies prior history of headache.  PAST MEDICAL HISTORY: Past Medical History  Diagnosis Date  . Acid reflux   . Hx: UTI (urinary tract infection)   . Hx of goiter   . BV (bacterial vaginosis) 4 18 2012  . Arthritis     pelvic bone  . COPD (chronic obstructive pulmonary disease) (HCC) 2010  . Rhinitis 02/15/2014  . Back pain   . Pelvic pain in female 03/01/2014  . Osteopenia determined by x-ray 03/10/2014  . Vaginal odor 01/05/2015  . Urinary frequency 01/05/2015  . Dyspareunia, female 01/05/2015    PAST SURGICAL HISTORY: Past Surgical History  Procedure Laterality Date  . Breast biopsy Right in the 1990s  . Tubal ligation    . Cervical fusion N/A in the 1990s  . Biopsy thyroid N/A 06/2009    had fna  .  Hysteroscopy w/ endometrial ablation N/A 2004  . Breast biopsy  1980    benign    MEDICATIONS: Current Outpatient Prescriptions on File Prior to Visit  Medication Sig Dispense Refill  . ALBUTEROL IN Inhale into the lungs as needed.    . ALPRAZolam (XANAX) 1 MG tablet Take 1 tablet by mouth 2 (two) times daily.   0  . Azelastine-Fluticasone (DYMISTA NA) Place into the nose 2 (two) times daily.    . Calcium Carbonate-Vit D-Min (CALCIUM 1200 PO) Take by mouth daily.    . Cholecalciferol (VITAMIN D3) 5000 UNITS CAPS Take 1 capsule by mouth daily.    Marland Kitchen escitalopram (LEXAPRO) 10 MG tablet take 1 tablet by mouth once daily 30 tablet 0  . HYDROcodone-acetaminophen (NORCO/VICODIN) 5-325 MG tablet Take 1 tablet by mouth 4 (four) times daily as needed.  0  . meloxicam (MOBIC) 7.5 MG tablet Take 1 tablet by mouth 2 (two) times daily as needed.   0  . metroNIDAZOLE (FLAGYL) 500 MG tablet Take 1 tablet (500 mg total) by mouth 2 (two) times daily. 14 tablet 0  . mirabegron ER (MYRBETRIQ) 25 MG TB24 tablet Take 1 tablet (25 mg total) by mouth daily. 30 tablet 3  . Multiple Vitamins-Minerals (MULTIVITAMIN PO) Take 1 tablet by mouth daily.    Marland Kitchen omeprazole (PRILOSEC) 40  MG capsule TAKE 1 CAPSULE BY MOUTH EVERY MORNING (Patient taking differently: daily. ) 30 capsule 5  . risedronate (ACTONEL) 150 MG tablet Take 1 tablet (150 mg total) by mouth every 30 (thirty) days. with water on empty stomach, nothing by mouth or lie down for next 30 minutes. 1 tablet 12   No current facility-administered medications on file prior to visit.    ALLERGIES: Allergies  Allergen Reactions  . Codeine Nausea Only  . Metrogel [Metronidazole]     Had local reaction  . Protonix [Pantoprazole Sodium] Rash    FAMILY HISTORY: Family History  Problem Relation Age of Onset  . Cancer Maternal Aunt     cervical   . Breast cancer Maternal Grandmother   . Breast cancer Other   . Cancer Other     breast  . Fibromyalgia Mother    . Other Mother     sciatic nerve; bad shoulder  . Other Father     collapsed lung  . COPD Father   . Heart attack Father   . Other Sister     valves replaced in heart  . Heart attack Sister   . Anxiety disorder Brother   . Other Brother     takes med for heart  . Anxiety disorder Daughter   . Emphysema Maternal Grandfather   . Heart attack Paternal Grandmother   . Breast cancer Maternal Aunt     SOCIAL HISTORY: Social History   Social History  . Marital Status: Married    Spouse Name: N/A  . Number of Children: N/A  . Years of Education: N/A   Occupational History  . Not on file.   Social History Main Topics  . Smoking status: Former Smoker -- 2.00 packs/day for 38 years    Types: Cigarettes    Start date: 10/25/1972    Quit date: 12/21/2010  . Smokeless tobacco: Never Used     Comment: use electronic cigs  . Alcohol Use: No  . Drug Use: No  . Sexual Activity:    Partners: Male    Birth Control/ Protection: Surgical     Comment: ablation and tubal   Other Topics Concern  . Not on file   Social History Narrative   Lives with husband in a one story home with a basement.  Has 3 children.  Education: 10th grade.  Stays at home watching her grandkids.    REVIEW OF SYSTEMS: Constitutional: No fevers, chills, or sweats, no generalized fatigue, change in appetite Eyes: No visual changes, double vision, eye pain Ear, nose and throat: No hearing loss, ear pain, nasal congestion, sore throat Cardiovascular: No chest pain, palpitations Respiratory:  No shortness of breath at rest or with exertion, wheezes GastrointestinaI: No nausea, vomiting, diarrhea, abdominal pain, fecal incontinence Genitourinary:  No dysuria, urinary retention or frequency Musculoskeletal:  Neck pain, back pain Integumentary: No rash, pruritus, skin lesions Neurological: as above Psychiatric: Depression, stress Endocrine: No palpitations, fatigue, diaphoresis, mood swings, change in  appetite, change in weight, increased thirst Hematologic/Lymphatic:  No anemia, purpura, petechiae. Allergic/Immunologic: no itchy/runny eyes, nasal congestion, recent allergic reactions, rashes  PHYSICAL EXAM: Filed Vitals:   01/10/15 1218  BP: 140/90  Pulse: 79   General: No acute distress.  Patient appears well-groomed.  Head:  Normocephalic/atraumatic Eyes:  fundi unremarkable, without vessel changes, exudates, hemorrhages or papilledema. Neck: supple, no paraspinal tenderness, full range of motion Back: bilateral paraspinal tenderness Heart: regular rate and rhythm Lungs: Clear to auscultation bilaterally. Vascular: No carotid  bruits. Neurological Exam: Mental status: alert and oriented to person, place, and time, recent and remote memory intact, fund of knowledge intact, attention and concentration intact, speech fluent and not dysarthric, language intact. Cranial nerves: CN I: not tested CN II: pupils equal, round and reactive to light, visual fields intact, fundi unremarkable, without vessel changes, exudates, hemorrhages or papilledema. CN III, IV, VI:  full range of motion, no nystagmus, no ptosis CN V: facial sensation intact CN VII: upper and lower face symmetric CN VIII: hearing intact CN IX, X: gag intact, uvula midline CN XI: sternocleidomastoid and trapezius muscles intact CN XII: tongue midline Bulk & Tone: normal, no fasciculations. Motor:  5/5 throughout  Sensation: temperature and vibration sensation intact. Deep Tendon Reflexes:  2+ throughout, toes downgoing.  Finger to nose testing:  Without dysmetria.  Heel to shin:  Without dysmetria.  Gait:  Normal station and stride.  Able to turn and tandem walk. Romberg negative.  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  Will prescribe tizanidine 2mg  to be taken up to every 8 hours as needed.  Advised caution regarding drowsiness, especially if she is going to drive. 2.  Refer to PT for neck  pain/cervicogenic headache 3.  Will have her follow up in about 3 months.  I want her to call with update in 4 weeks  45 minutes spent face to face with patient, over 50% spent discussing diagnosis and management.  Thank you for allowing me to take part in the care of this patient.  Shon MilletAdam Josilyn Shippee, DO  CC:  Prentice DockerSuresh Koneswaran, MD  Lubertha SouthSteve Luking, MD

## 2015-01-10 NOTE — Patient Instructions (Signed)
I think the headaches are coming from the neck 1.  I will prescribe you a muscle relaxant called tizanidine 2mg .  Take 1 tablet every 8 hours (or up to 3 times daily) as needed.  Take it if you start feeling the increased neck tension.  You may take it 3 times a day if needed.  Caution for drowsiness, especially while driving. 2.  We will refer you for physical therapy for the neck 3.  Follow up 2 to 3 months.  Call in 4 weeks with update and we can make other adjustments if needed.

## 2015-02-02 ENCOUNTER — Encounter: Payer: Self-pay | Admitting: Adult Health

## 2015-02-02 ENCOUNTER — Ambulatory Visit: Payer: BLUE CROSS/BLUE SHIELD | Admitting: Adult Health

## 2015-03-19 ENCOUNTER — Other Ambulatory Visit: Payer: Self-pay | Admitting: Adult Health

## 2015-04-13 ENCOUNTER — Ambulatory Visit (INDEPENDENT_AMBULATORY_CARE_PROVIDER_SITE_OTHER): Payer: BLUE CROSS/BLUE SHIELD | Admitting: Neurology

## 2015-04-13 ENCOUNTER — Encounter: Payer: Self-pay | Admitting: Neurology

## 2015-04-13 VITALS — BP 114/70 | HR 74 | Ht 64.0 in | Wt 121.0 lb

## 2015-04-13 DIAGNOSIS — G43709 Chronic migraine without aura, not intractable, without status migrainosus: Secondary | ICD-10-CM

## 2015-04-13 DIAGNOSIS — M542 Cervicalgia: Secondary | ICD-10-CM | POA: Diagnosis not present

## 2015-04-13 DIAGNOSIS — R930 Abnormal findings on diagnostic imaging of skull and head, not elsewhere classified: Secondary | ICD-10-CM

## 2015-04-13 DIAGNOSIS — R9082 White matter disease, unspecified: Secondary | ICD-10-CM

## 2015-04-13 MED ORDER — PROMETHAZINE HCL 12.5 MG PO TABS: 12.5000 mg | ORAL_TABLET | Freq: Four times a day (QID) | ORAL | Status: DC | PRN

## 2015-04-13 MED ORDER — NORTRIPTYLINE HCL 10 MG PO CAPS: 10.0000 mg | ORAL_CAPSULE | Freq: Every day | ORAL | Status: DC

## 2015-04-13 MED ORDER — SUMATRIPTAN SUCCINATE 100 MG PO TABS: ORAL_TABLET | ORAL | Status: DC

## 2015-04-13 NOTE — Progress Notes (Signed)
NEUROLOGY FOLLOW UP OFFICE NOTE  Felicia Greer [id number]  HISTORY OF PRESENT ILLNESS: Felicia Greer is a 55 year old right-handed woman with low back pain who follows up for cervicogenic headache.  History obtained by patient and her husband.  Imaging of brain MRI reviewed.  UPDATE: She was referred to PT for neck pain/headaches, however she did not attend.  She was also prescribed tizanidine  every 8 hours as needed.  It helps, but only with hydrocodone.  She continues to have headaches over 20 days per week.  They are now more associated with nausea, photophobia and phonophobia but not vomiting.  She feels more nauseous and lightheaded other times as well.  She had an MRI of the brain in February, which showed nonspecific punctate white matter hyperintensities in the periventricular white matter.  HISTORY: She started having headaches 6 months ago.  It is a shooting pain that starts in the back of the neck and travels up to the front of the head.  It is bilateral but also notes left sided headache as well.  It is of gradual onset and gets worse as the day progresses.  It has been occuring almost daily.  When it is severe, she rarely notes nausea, photophobia and phonophobia.  She tried hydrocodone, which was ineffective.  She denies any prior head or neck trauma.  She does have stress as she frequently cares for her young grandchildren and often will be carrying the babies in her arm.    She has chronic neck pain and had cervical spinal fusion many years ago.  She has residual numbness in her right thumb as a result of the surgery.  She notes pain in the right medial upper arm, radiating down to the elbow.  She denies hand weakness.  She denies prior history of headache.  PAST MEDICAL HISTORY: Past Medical History  Diagnosis Date  . Acid reflux   . Hx: UTI (urinary tract infection)   . Hx of goiter   . BV (bacterial vaginosis) 4 18 2012  . Arthritis     pelvic bone  . COPD (chronic  obstructive pulmonary disease) (HCC) 2010  . Rhinitis 02/15/2014  . Back pain   . Pelvic pain in female 03/01/2014  . Osteopenia determined by x-ray 03/10/2014  . Vaginal odor 01/05/2015  . Urinary frequency 01/05/2015  . Dyspareunia, female 01/05/2015    MEDICATIONS: Current Outpatient Prescriptions on File Prior to Visit  Medication Sig Dispense Refill  . ALBUTEROL IN Inhale into the lungs as needed.    . ALPRAZolam (XANAX) 1 MG tablet Take 1 tablet by mouth 2 (two) times daily.   0  . Azelastine-Fluticasone (DYMISTA NA) Place into the nose 2 (two) times daily.    . Calcium Carbonate-Vit D-Min (CALCIUM 1200 PO) Take by mouth daily.    . Cholecalciferol (VITAMIN D3) 5000 UNITS CAPS Take 1 capsule by mouth daily.    Marland Kitchen HYDROcodone-acetaminophen (NORCO/VICODIN) 5-325 MG tablet Take 1 tablet by mouth 4 (four) times daily as needed.  0  . meloxicam (MOBIC) 7.5 MG tablet Take 1 tablet by mouth 2 (two) times daily as needed.   0  . Multiple Vitamins-Minerals (MULTIVITAMIN PO) Take 1 tablet by mouth daily.    Marland Kitchen omeprazole (PRILOSEC) 40 MG capsule TAKE 1 CAPSULE BY MOUTH EVERY MORNING (Patient taking differently: daily. ) 30 capsule 5  . risedronate (ACTONEL) 150 MG tablet take 1 tablet by mouth every month WITH WATER ON EMPTY STOMACH DO NOT LIE DOWN  FOR 30 MINUTES 1 tablet 12  . tiZANidine (ZANAFLEX) 2 MG tablet Take 1 tablet (2 mg total) by mouth every 8 (eight) hours as needed for muscle spasms. 90 tablet 0   No current facility-administered medications on file prior to visit.    ALLERGIES: Allergies  Allergen Reactions  . Codeine Nausea Only  . Metrogel [Metronidazole]     Had local reaction  . Protonix [Pantoprazole Sodium] Rash    FAMILY HISTORY: Family History  Problem Relation Age of Onset  . Cancer Maternal Aunt     cervical   . Breast cancer Maternal Grandmother   . Breast cancer Other   . Cancer Other     breast  . Fibromyalgia Mother   . Other Mother     sciatic nerve;  bad shoulder  . Other Father     collapsed lung  . COPD Father   . Heart attack Father   . Other Sister     valves replaced in heart  . Heart attack Sister   . Anxiety disorder Brother   . Other Brother     takes med for heart  . Anxiety disorder Daughter   . Emphysema Maternal Grandfather   . Heart attack Paternal Grandmother   . Breast cancer Maternal Aunt     SOCIAL HISTORY: Social History   Social History  . Marital Status: Married    Spouse Name: N/A  . Number of Children: N/A  . Years of Education: N/A   Occupational History  . Not on file.   Social History Main Topics  . Smoking status: Former Smoker -- 2.00 packs/day for 38 years    Types: Cigarettes    Start date: 10/25/1972    Quit date: 12/21/2010  . Smokeless tobacco: Never Used     Comment: use electronic cigs  . Alcohol Use: No  . Drug Use: No  . Sexual Activity:    Partners: Male    Birth Control/ Protection: Surgical     Comment: ablation and tubal   Other Topics Concern  . Not on file   Social History Narrative   Lives with husband in a one story home with a basement.  Has 3 children.  Education: 10th grade.  Stays at home watching her grandkids.    REVIEW OF SYSTEMS: Constitutional: No fevers, chills, or sweats, no generalized fatigue, change in appetite Eyes: No visual changes, double vision, eye pain Ear, nose and throat: No hearing loss, ear pain, nasal congestion, sore throat Cardiovascular: No chest pain, palpitations Respiratory:  No shortness of breath at rest or with exertion, wheezes GastrointestinaI: No nausea, vomiting, diarrhea, abdominal pain, fecal incontinence Genitourinary:  No dysuria, urinary retention or frequency Musculoskeletal:  No neck pain, back pain Integumentary: No rash, pruritus, skin lesions Neurological: as above Psychiatric: No depression, insomnia, anxiety Endocrine: No palpitations, fatigue, diaphoresis, mood swings, change in appetite, change in weight,  increased thirst Hematologic/Lymphatic:  No anemia, purpura, petechiae. Allergic/Immunologic: no itchy/runny eyes, nasal congestion, recent allergic reactions, rashes  PHYSICAL EXAM: Filed Vitals:   04/13/15 1107  BP: 114/70  Pulse: 74   General: No acute distress.  Patient appears well-groomed.  normal body habitus. Head:  Normocephalic/atraumatic Eyes:  Fundoscopic exam unremarkable without vessel changes, exudates, hemorrhages or papilledema. Neck: supple, no paraspinal tenderness, full range of motion Heart:  Regular rate and rhythm Lungs:  Clear to auscultation bilaterally Back: No paraspinal tenderness Neurological Exam: alert and oriented to person, place, and time. Attention span and concentration intact,  recent and remote memory intact, fund of knowledge intact.  Speech fluent and not dysarthric, language intact.  CN II-XII intact. Fundoscopic exam unremarkable without vessel changes, exudates, hemorrhages or papilledema.  Bulk and tone normal, muscle strength 5/5 throughout.  Sensation to light touch, temperature and vibration intact.  Deep tendon reflexes 2+ throughout, toes downgoing.  Finger to nose and heel to shin testing intact.  Gait normal, Romberg negative.  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  Nonspecific findings of uncertain clinical significance.  Possibly related to 30 year history of smoking (quit in 2012).  PLAN: 1.  Will discontinue Lexapro (she only takes ).  Start nortriptyline  at bedtime.   2.  For abortive therapy, start sumatriptan .  Promethazine 12.5mg  for nausea 3.  Check MRI of cervical spine.  I would also recheck MRI of brain with and without contrast in one year 4.  Follow up in 3 months but she is to contact us with update in 4 weeks.  Shon Millet, DO  CC:  Elfredia Nevins, MD

## 2015-04-13 NOTE — Patient Instructions (Signed)
Migraine Recommendations: 1.  Start nortriptyline 10mg  at bedtime.  Call in 4 weeks with update and we can adjust dose if needed. 2.  Take sumatriptan 100mg  at earliest onset of headache.  May repeat dose once in 2 hours if needed.  Do not exceed two tablets in 24 hours. 3.  Limit use of pain relievers to no more than 2 days out of the week.  These medications include acetaminophen, ibuprofen, triptans and narcotics.  This will help reduce risk of rebound headaches. 4.  Be aware of common food triggers such as processed sweets, processed foods with nitrites (such as deli meat, hot dogs, sausages), foods with MSG, alcohol (such as wine), chocolate, certain cheeses, certain fruits (dried fruits, some citrus fruit), vinegar, diet soda. 4.  Avoid caffeine 5.  Routine exercise 6.  Proper sleep hygiene 7.  Stay adequately hydrated with water 8.  Keep a headache diary. 9.  Maintain proper stress management. 10.  Do not skip meals. 11.  Consider supplements:  Magnesium oxide 400mg  to 600mg  daily, riboflavin 400mg , Coenzyme Q 10 100mg  three times daily 12.  Stop Lexapro. 13.  Will get MRI of cervical spine 14.  Follow up in 3 months but CALL IN 4 WEEKS WITH UPDATE

## 2015-04-13 NOTE — Progress Notes (Signed)
Chart forwarded.  

## 2015-05-03 ENCOUNTER — Ambulatory Visit: Payer: BLUE CROSS/BLUE SHIELD | Admitting: Diagnostic Neuroimaging

## 2015-05-09 ENCOUNTER — Telehealth: Payer: Self-pay

## 2015-05-09 NOTE — Telephone Encounter (Signed)
-----   Message from Drema DallasAdam R Jaffe, DO sent at 05/08/2015  4:24 PM EDT ----- MRI of cervical spine does not show anything structural (or fixable) to be causing the headache.

## 2015-05-09 NOTE — Telephone Encounter (Signed)
Results were left on pt's voicemail, with instructions to call back with any questions or concerns in relation to results.   

## 2015-05-15 ENCOUNTER — Other Ambulatory Visit: Payer: Self-pay

## 2015-05-15 ENCOUNTER — Telehealth: Payer: Self-pay | Admitting: Neurology

## 2015-05-15 MED ORDER — PREDNISONE 10 MG PO TABS: ORAL_TABLET | ORAL | Status: DC

## 2015-05-15 MED ORDER — PROMETHAZINE HCL 12.5 MG PO TABS: 12.5000 mg | ORAL_TABLET | Freq: Four times a day (QID) | ORAL | Status: DC | PRN

## 2015-05-15 MED ORDER — NORTRIPTYLINE HCL 25 MG PO CAPS: 25.0000 mg | ORAL_CAPSULE | Freq: Every day | ORAL | Status: DC

## 2015-05-15 NOTE — Telephone Encounter (Signed)
Message relayed to patient. Verbalized understanding and denied questions. RX sent in. Pt aware.

## 2015-05-15 NOTE — Telephone Encounter (Signed)
To break this headache, we can prescribe her a prednisone 10mg  tablet taper.  Take 6tabs x1day, then 5tabs x1day, then 4tabs x1day, then 3tabs x1day, then 2tabs x1day, then 1tab x1day, then STOP  I would like to increase nortriptyline to 25mg  at bedtime.  We can adjust dose in 4 weeks if needed.

## 2015-05-15 NOTE — Telephone Encounter (Signed)
Pt needs to talk to someone about her headache please call [phone]

## 2015-05-15 NOTE — Telephone Encounter (Signed)
Called and spoke to patient. States that last Wednesday she had a temporary Crown placed. It caused her to have a headache, which is has now had for about 6 days. Pt states that she doesn't know if the nortriptyline is helping, as she's had a lot of other factors that have probably been contributing to her headaches that she normally doesn't. Pt states the sumatriptan has helped a couple times in the past, but is not helping for this headache. Please advise.   Pt does also need refill on nortriptyline. Please advise on how this should be refilled, ie the same or increased.

## 2015-05-17 ENCOUNTER — Encounter: Payer: Self-pay | Admitting: Neurology

## 2015-07-14 ENCOUNTER — Ambulatory Visit: Payer: BLUE CROSS/BLUE SHIELD | Admitting: Neurology

## 2016-03-12 DIAGNOSIS — Z6821 Body mass index (BMI) 21.0-21.9, adult: Secondary | ICD-10-CM | POA: Diagnosis not present

## 2016-03-12 DIAGNOSIS — K219 Gastro-esophageal reflux disease without esophagitis: Secondary | ICD-10-CM | POA: Diagnosis not present

## 2016-03-12 DIAGNOSIS — Z1389 Encounter for screening for other disorder: Secondary | ICD-10-CM | POA: Diagnosis not present

## 2016-03-12 DIAGNOSIS — F329 Major depressive disorder, single episode, unspecified: Secondary | ICD-10-CM | POA: Diagnosis not present

## 2016-03-12 DIAGNOSIS — G894 Chronic pain syndrome: Secondary | ICD-10-CM | POA: Diagnosis not present

## 2016-03-12 DIAGNOSIS — F330 Major depressive disorder, recurrent, mild: Secondary | ICD-10-CM | POA: Diagnosis not present

## 2016-03-15 ENCOUNTER — Other Ambulatory Visit (HOSPITAL_COMMUNITY): Payer: Self-pay | Admitting: Internal Medicine

## 2016-03-15 DIAGNOSIS — Z1231 Encounter for screening mammogram for malignant neoplasm of breast: Secondary | ICD-10-CM

## 2016-04-01 ENCOUNTER — Ambulatory Visit (HOSPITAL_COMMUNITY): Payer: Self-pay

## 2016-05-24 DIAGNOSIS — R51 Headache: Secondary | ICD-10-CM | POA: Diagnosis not present

## 2016-05-24 DIAGNOSIS — G894 Chronic pain syndrome: Secondary | ICD-10-CM | POA: Diagnosis not present

## 2016-05-24 DIAGNOSIS — F419 Anxiety disorder, unspecified: Secondary | ICD-10-CM | POA: Diagnosis not present

## 2016-05-24 DIAGNOSIS — Z6821 Body mass index (BMI) 21.0-21.9, adult: Secondary | ICD-10-CM | POA: Diagnosis not present

## 2016-05-24 DIAGNOSIS — K219 Gastro-esophageal reflux disease without esophagitis: Secondary | ICD-10-CM | POA: Diagnosis not present

## 2016-05-24 DIAGNOSIS — Z23 Encounter for immunization: Secondary | ICD-10-CM | POA: Diagnosis not present

## 2016-06-14 ENCOUNTER — Other Ambulatory Visit (HOSPITAL_COMMUNITY): Payer: Self-pay | Admitting: Registered Nurse

## 2016-06-14 ENCOUNTER — Ambulatory Visit (HOSPITAL_COMMUNITY)
Admission: RE | Admit: 2016-06-14 | Discharge: 2016-06-14 | Disposition: A | Discharge status: Home / Self Care | Payer: BLUE CROSS/BLUE SHIELD | Source: Ambulatory Visit | Attending: Registered Nurse | Admitting: Registered Nurse

## 2016-06-14 DIAGNOSIS — M7989 Other specified soft tissue disorders: Secondary | ICD-10-CM | POA: Insufficient documentation

## 2016-06-14 DIAGNOSIS — M79605 Pain in left leg: Secondary | ICD-10-CM

## 2016-06-14 DIAGNOSIS — K219 Gastro-esophageal reflux disease without esophagitis: Secondary | ICD-10-CM | POA: Diagnosis not present

## 2016-06-14 DIAGNOSIS — Z6820 Body mass index (BMI) 20.0-20.9, adult: Secondary | ICD-10-CM | POA: Diagnosis not present

## 2016-06-14 DIAGNOSIS — M79662 Pain in left lower leg: Secondary | ICD-10-CM | POA: Diagnosis not present

## 2016-06-14 DIAGNOSIS — G894 Chronic pain syndrome: Secondary | ICD-10-CM | POA: Diagnosis not present

## 2016-06-14 DIAGNOSIS — Z1389 Encounter for screening for other disorder: Secondary | ICD-10-CM | POA: Diagnosis not present

## 2016-07-31 ENCOUNTER — Ambulatory Visit (INDEPENDENT_AMBULATORY_CARE_PROVIDER_SITE_OTHER): Payer: BLUE CROSS/BLUE SHIELD | Admitting: Orthopaedic Surgery

## 2016-07-31 ENCOUNTER — Encounter: Payer: Self-pay | Admitting: Orthopaedic Surgery

## 2016-07-31 ENCOUNTER — Ambulatory Visit (INDEPENDENT_AMBULATORY_CARE_PROVIDER_SITE_OTHER): Payer: BLUE CROSS/BLUE SHIELD

## 2016-07-31 VITALS — BP 116/83 | HR 97 | Temp 97.3°F | Ht 65.5 in | Wt 117.8 lb

## 2016-07-31 DIAGNOSIS — M25572 Pain in left ankle and joints of left foot: Secondary | ICD-10-CM

## 2016-07-31 DIAGNOSIS — F17210 Nicotine dependence, cigarettes, uncomplicated: Secondary | ICD-10-CM

## 2016-07-31 MED ORDER — NAPROXEN 500 MG PO TABS: 500.0000 mg | ORAL_TABLET | Freq: Two times a day (BID) | ORAL | 5 refills | Status: DC

## 2016-07-31 NOTE — Progress Notes (Signed)
Subjective:    Patient ID: Felicia Greer, female    DOB: 1960-07-07, 56 y.o.   MRN: [id number]  HPI She has had swelling and pain of the left ankle and lower leg for several months.  She was seen at [facility] and had Doppler study and x-rays on 06-14-16 which were negative.  She continues to have pain and swelling of the medial left ankle.  It gets worse as the day goes on.  If she does not do much walking or rests a lot, she has no problems.  She has no trauma, no redness.  She has tried ice, heat, rest, ibuprofen.  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  She works as well.  She has no change in shoe wear or other joint pains.  She is a smoker and is willing to cut back or stop.   Review of Systems  HENT: Negative for congestion.   Respiratory: Positive for shortness of breath. Negative for cough.   Cardiovascular: Positive for leg swelling. Negative for chest pain.  Endocrine: Positive for cold intolerance.  Musculoskeletal: Positive for arthralgias, back pain and gait problem.  Allergic/Immunologic: Positive for environmental allergies.  Psychiatric/Behavioral: The patient is nervous/anxious.    Past Medical History:  Diagnosis Date  . Acid reflux   . Arthritis    pelvic bone  . Back pain   . BV (bacterial vaginosis) 4 18 2012  . COPD (chronic obstructive pulmonary disease) (HCC) 2010  . Dyspareunia, female 01/05/2015  . Hx of goiter   . Hx: UTI (urinary tract infection)   . Osteopenia determined by x-ray 03/10/2014  . Pelvic pain in female 03/01/2014  . Rhinitis 02/15/2014  . Urinary frequency 01/05/2015  . Vaginal odor 01/05/2015    Past Surgical History:  Procedure Laterality Date  . BIOPSY THYROID N/A 06/2009   had fna  . BREAST BIOPSY Right in the 1990s  . BREAST BIOPSY  1980   benign  . CERVICAL FUSION N/A in the 1990s  . HYSTEROSCOPY W/ ENDOMETRIAL ABLATION N/A 2004  . TUBAL LIGATION      Current  Outpatient Prescriptions on File Prior to Visit  Medication Sig Dispense Refill  . ALPRAZolam (XANAX) 1 MG tablet Take 1 tablet by mouth 2 (two) times daily.   0  . HYDROcodone-acetaminophen (NORCO/VICODIN) 5-325 MG tablet Take 1 tablet by mouth 4 (four) times daily as needed.  0  . nortriptyline (PAMELOR) 25 MG capsule Take 1 capsule (25 mg total) by mouth at bedtime. 30 capsule 2  . ALBUTEROL IN Inhale into the lungs as needed.    . Azelastine-Fluticasone (DYMISTA NA) Place into the nose 2 (two) times daily.    . Calcium Carbonate-Vit D-Min (CALCIUM 1200 PO) Take by mouth daily.    . Cholecalciferol (VITAMIN D3) 5000 UNITS CAPS Take 1 capsule by mouth daily.    . meloxicam (MOBIC) 7.5 MG tablet Take 1 tablet by mouth 2 (two) times daily as needed.   0  . Multiple Vitamins-Minerals (MULTIVITAMIN PO) Take 1 tablet by mouth daily.    Marland Kitchen omeprazole (PRILOSEC) 40 MG capsule TAKE 1 CAPSULE BY MOUTH EVERY MORNING (Patient taking differently: daily. ) 30 capsule 5  . predniSONE (DELTASONE) 10 MG tablet 10mg  tablets. Take 6tabs x1day, then 5tabs x1day, then 4tabs x1day, then 3tabs x1day, then 2tabs x1day, then 1tab x1day, then STOP 21 tablet 0  . promethazine (PHENERGAN) 12.5 MG tablet Take 1 tablet (12.5 mg  total) by mouth every 6 (six) hours as needed for nausea or vomiting. 30 tablet 0  . risedronate (ACTONEL) 150 MG tablet take 1 tablet by mouth every month WITH WATER ON EMPTY STOMACH DO NOT LIE DOWN FOR 30 MINUTES 1 tablet 12  . SUMAtriptan (IMITREX) 100 MG tablet Take 1 tablet at earliest onset of headache.  May repeat in 2 hours if headache persists or recurs.  Do not exceed 2 tablets in 24 hours 10 tablet 2  . tiZANidine (ZANAFLEX) 2 MG tablet Take 1 tablet (2 mg total) by mouth every 8 (eight) hours as needed for muscle spasms. 90 tablet 0   No current facility-administered medications on file prior to visit.     Social History   Social History  . Marital status: Married    Spouse name: N/A   . Number of children: N/A  . Years of education: N/A   Occupational History  . Not on file.   Social History Main Topics  . Smoking status: Current Every Day Smoker    Packs/day: 2.00    Years: 38.00    Types: Cigarettes    Start date: 10/25/1972    Last attempt to quit: 12/21/2010  . Smokeless tobacco: Never Used     Comment: use electronic cigs  . Alcohol use No  . Drug use: No  . Sexual activity: Not Currently    Partners: Male    Birth control/ protection: Surgical     Comment: ablation and tubal   Other Topics Concern  . Not on file   Social History Narrative   Lives with husband in a one story home with a basement.  Has 3 children.  Education: 10th grade.  Stays at home watching her grandkids.    Family History  Problem Relation Age of Onset  . Cancer Maternal Aunt        cervical   . Breast cancer Maternal Grandmother   . Breast cancer Other   . Fibromyalgia Mother   . Other Mother        sciatic nerve; bad shoulder  . Other Father        collapsed lung  . COPD Father   . Heart attack Father   . Other Sister        valves replaced in heart  . Heart attack Sister   . Anxiety disorder Brother   . Other Brother        takes med for heart  . Anxiety disorder Daughter   . Emphysema Maternal Grandfather   . Heart attack Paternal Grandmother   . Breast cancer Maternal Aunt   . Cancer Other        breast    BP 116/83   Pulse 97   Temp (!) 97.3 F (36.3 C)   Ht 5' 5.5" (1.664 m)   Wt 117 lb 12.8 oz (53.4 kg)   BMI 19.30 kg/m      Objective:   Physical Exam  Constitutional: She is oriented to person, place, and time. She appears well-developed and well-nourished.  HENT:  Head: Normocephalic and atraumatic.  Eyes: Conjunctivae and EOM are normal. Pupils are equal, round, and reactive to light.  Neck: Normal range of motion. Neck supple.  Cardiovascular: Normal rate, regular rhythm and intact distal pulses.   Pulmonary/Chest: Effort normal.   Abdominal: Soft.  Musculoskeletal: She exhibits tenderness (She has no swelling of the left ankle today and full ROM.  Gait is normal.  she has an area  of the anteroir distal tibia of point tenderness but no redness or swelling.).  Neurological: She is alert and oriented to person, place, and time. She displays normal reflexes. No cranial nerve deficit. She exhibits normal muscle tone. Coordination normal.  Skin: Skin is warm and dry.  Psychiatric: She has a normal mood and affect. Her behavior is normal. Judgment and thought content normal.  Vitals reviewed.  X-rays of the left ankle were done, reported separately.       Assessment & Plan:   Encounter Diagnoses  Name Primary?  . Pain of joint of left ankle and foot Yes  . Cigarette nicotine dependence without complication    I have told her to try Aspercreme rubs to the area of tenderness several times a day.  I have given Contrast bath instruction sheet.  I have told her about knee highs with some support in them to wear.  I have given Rx for Naprosyn.  Return in three weeks.  Call if any problem.  Precautions discussed.   Electronically Signed Darreld Mclean, MD 7/11/20189:10 AM

## 2016-07-31 NOTE — Patient Instructions (Addendum)
Steps to Quit Smoking Smoking tobacco can be bad for your health. It can also affect almost every organ in your body. Smoking puts you and people around you at risk for many serious long-lasting (chronic) diseases. Quitting smoking is hard, but it is one of the best things that you can do for your health. It is never too late to quit. What are the benefits of quitting smoking? When you quit smoking, you lower your risk for getting serious diseases and conditions. They can include:  Lung cancer or lung disease.  Heart disease.  Stroke.  Heart attack.  Not being able to have children (infertility).  Weak bones (osteoporosis) and broken bones (fractures).  If you have coughing, wheezing, and shortness of breath, those symptoms may get better when you quit. You may also get sick less often. If you are pregnant, quitting smoking can help to lower your chances of having a baby of low birth weight. What can I do to help me quit smoking? Talk with your doctor about what can help you quit smoking. Some things you can do (strategies) include:  Quitting smoking totally, instead of slowly cutting back how much you smoke over a period of time.  Going to in-person counseling. You are more likely to quit if you go to many counseling sessions.  Using resources and support systems, such as: ? Online chats with a counselor. ? Phone quitlines. ? Printed self-help materials. ? Support groups or group counseling. ? Text messaging programs. ? Mobile phone apps or applications.  Taking medicines. Some of these medicines may have nicotine in them. If you are pregnant or breastfeeding, do not take any medicines to quit smoking unless your doctor says it is okay. Talk with your doctor about counseling or other things that can help you.  Talk with your doctor about using more than one strategy at the same time, such as taking medicines while you are also going to in-person counseling. This can help make  quitting easier. What things can I do to make it easier to quit? Quitting smoking might feel very hard at first, but there is a lot that you can do to make it easier. Take these steps:  Talk to your family and friends. Ask them to support and encourage you.  Call phone quitlines, reach out to support groups, or work with a counselor.  Ask people who smoke to not smoke around you.  Avoid places that make you want (trigger) to smoke, such as: ? Bars. ? Parties. ? Smoke-break areas at work.  Spend time with people who do not smoke.  Lower the stress in your life. Stress can make you want to smoke. Try these things to help your stress: ? Getting regular exercise. ? Deep-breathing exercises. ? Yoga. ? Meditating. ? Doing a body scan. To do this, close your eyes, focus on one area of your body at a time from head to toe, and notice which parts of your body are tense. Try to relax the muscles in those areas.  Download or buy apps on your mobile phone or tablet that can help you stick to your quit plan. There are many free apps, such as QuitGuide from the CDC (Centers for Disease Control and Prevention). You can find more support from smokefree.gov and other websites.  This information is not intended to replace advice given to you by your health care provider. Make sure you discuss any questions you have with your health care provider. Document Released: 11/03/2008 Document   Revised: 09/05/2015 Document Reviewed: 05/24/2014 Elsevier Interactive Patient Education  2018 Elsevier Inc.  

## 2016-08-21 ENCOUNTER — Encounter: Payer: Self-pay | Admitting: Orthopaedic Surgery

## 2016-08-21 ENCOUNTER — Ambulatory Visit: Payer: BLUE CROSS/BLUE SHIELD | Admitting: Orthopaedic Surgery

## 2016-08-28 ENCOUNTER — Encounter: Payer: Self-pay | Admitting: Orthopaedic Surgery

## 2016-08-28 ENCOUNTER — Ambulatory Visit (INDEPENDENT_AMBULATORY_CARE_PROVIDER_SITE_OTHER): Payer: BLUE CROSS/BLUE SHIELD | Admitting: Orthopaedic Surgery

## 2016-08-28 VITALS — BP 129/76 | HR 85 | Temp 97.1°F | Ht 65.5 in | Wt 116.0 lb

## 2016-08-28 DIAGNOSIS — F17210 Nicotine dependence, cigarettes, uncomplicated: Secondary | ICD-10-CM

## 2016-08-28 DIAGNOSIS — M25572 Pain in left ankle and joints of left foot: Secondary | ICD-10-CM

## 2016-08-28 MED ORDER — PREDNISONE 5 MG (21) PO TBPK: ORAL_TABLET | ORAL | 0 refills | Status: DC

## 2016-08-28 NOTE — Progress Notes (Signed)
Patient Felicia Greer, female DOB:1960-11-07, 56 y.o. YQM:[id number]  Chief Complaint  Patient presents with  . Follow-up    Left ankle pain    HPI  Sakai Heinle is a 56 y.o. female who has pain of the left lower leg more of the "shin" area above the left ankle.  She had left foot and ankle pain and now it has moved proximally.  She has no trauma, no redness, no swelling.  She is taking the Naprosyn and doing the contrast baths which have helped. HPI  Body mass index is 19.01 kg/m.  ROS  Review of Systems  HENT: Negative for congestion.   Respiratory: Positive for shortness of breath. Negative for cough.   Cardiovascular: Positive for leg swelling. Negative for chest pain.  Endocrine: Positive for cold intolerance.  Musculoskeletal: Positive for arthralgias, back pain and gait problem.  Allergic/Immunologic: Positive for environmental allergies.  Psychiatric/Behavioral: The patient is nervous/anxious.     Past Medical History:  Diagnosis Date  . Acid reflux   . Arthritis    pelvic bone  . Back pain   . BV (bacterial vaginosis) 4 18 2012  . COPD (chronic obstructive pulmonary disease) (HCC) 2010  . Dyspareunia, female 01/05/2015  . Hx of goiter   . Hx: UTI (urinary tract infection)   . Osteopenia determined by x-ray 03/10/2014  . Pelvic pain in female 03/01/2014  . Rhinitis 02/15/2014  . Urinary frequency 01/05/2015  . Vaginal odor 01/05/2015    Past Surgical History:  Procedure Laterality Date  . BIOPSY THYROID N/A 06/2009   had fna  . BREAST BIOPSY Right in the 1990s  . BREAST BIOPSY  1980   benign  . CERVICAL FUSION N/A in the 1990s  . HYSTEROSCOPY W/ ENDOMETRIAL ABLATION N/A 2004  . TUBAL LIGATION      Family History  Problem Relation Age of Onset  . Cancer Maternal Aunt        cervical   . Breast cancer Maternal Grandmother   . Breast cancer Other   . Fibromyalgia Mother   . Other Mother        sciatic nerve; bad shoulder  . Other Father    collapsed lung  . COPD Father   . Heart attack Father   . Other Sister        valves replaced in heart  . Heart attack Sister   . Anxiety disorder Brother   . Other Brother        takes med for heart  . Anxiety disorder Daughter   . Emphysema Maternal Grandfather   . Heart attack Paternal Grandmother   . Breast cancer Maternal Aunt   . Cancer Other        breast    Social History Social History  Substance Use Topics  . Smoking status: Current Every Day Smoker    Packs/day: 2.00    Years: 38.00    Types: Cigarettes    Start date: 10/25/1972    Last attempt to quit: 12/21/2010  . Smokeless tobacco: Never Used     Comment: use electronic cigs  . Alcohol use No    Allergies  Allergen Reactions  . Codeine Nausea Only  . Metrogel [Metronidazole]     Had local reaction  . Protonix [Pantoprazole Sodium] Rash    Current Outpatient Prescriptions  Medication Sig Dispense Refill  . ALBUTEROL IN Inhale into the lungs as needed.    . ALPRAZolam (XANAX) 1 MG tablet Take 1 tablet by mouth 2 (  two) times daily.   0  . Azelastine-Fluticasone (DYMISTA NA) Place into the nose 2 (two) times daily.    . Calcium Carbonate-Vit D-Min (CALCIUM 1200 PO) Take by mouth daily.    . Cholecalciferol (VITAMIN D3) 5000 UNITS CAPS Take 1 capsule by mouth daily.    Marland Kitchen. HYDROcodone-acetaminophen (NORCO/VICODIN) 5-325 MG tablet Take 1 tablet by mouth 4 (four) times daily as needed.  0  . meloxicam (MOBIC) 7.5 MG tablet Take 1 tablet by mouth 2 (two) times daily as needed.   0  . Multiple Vitamins-Minerals (MULTIVITAMIN PO) Take 1 tablet by mouth daily.    . naproxen (NAPROSYN) 500 MG tablet Take 1 tablet (500 mg total) by mouth 2 (two) times daily with a meal. 60 tablet 5  . nortriptyline (PAMELOR) 25 MG capsule Take 1 capsule (25 mg total) by mouth at bedtime. 30 capsule 2  . omeprazole (PRILOSEC) 40 MG capsule TAKE 1 CAPSULE BY MOUTH EVERY MORNING (Patient taking differently: daily. ) 30 capsule 5  .  predniSONE (STERAPRED UNI-PAK 21 TAB) 5 MG (21) TBPK tablet Take 6 pills first day; 5 pills second day; 4 pills third day; 3 pills fourth day; 2 pills next day and 1 pill last day. Do not take the Naprosyn while on this medicine. 21 tablet 0  . promethazine (PHENERGAN) 12.5 MG tablet Take 1 tablet (12.5 mg total) by mouth every 6 (six) hours as needed for nausea or vomiting. 30 tablet 0  . risedronate (ACTONEL) 150 MG tablet take 1 tablet by mouth every month WITH WATER ON EMPTY STOMACH DO NOT LIE DOWN FOR 30 MINUTES 1 tablet 12  . SUMAtriptan (IMITREX) 100 MG tablet Take 1 tablet at earliest onset of headache.  May repeat in 2 hours if headache persists or recurs.  Do not exceed 2 tablets in 24 hours 10 tablet 2  . tiZANidine (ZANAFLEX) 2 MG tablet Take 1 tablet (2 mg total) by mouth every 8 (eight) hours as needed for muscle spasms. 90 tablet 0   No current facility-administered medications for this visit.      Physical Exam  Blood pressure 129/76, pulse 85, temperature (!) 97.1 F (36.2 C), height 5' 5.5" (1.664 m), weight 116 lb (52.6 kg).  Constitutional: overall normal hygiene, normal nutrition, well developed, normal grooming, normal body habitus. Assistive device:none  Musculoskeletal: gait and station Limp left, muscle tone and strength are normal, no tremors or atrophy is present.  .  Neurological: coordination overall normal.  Deep tendon reflex/nerve stretch intact.  Sensation normal.  Cranial nerves II-XII intact.   Skin:   Normal overall no scars, lesions, ulcers or rashes. No psoriasis.  Psychiatric: Alert and oriented x 3.  Recent memory intact, remote memory unclear.  Normal mood and affect. Well groomed.  Good eye contact.  Cardiovascular: overall no swelling, no varicosities, no edema bilaterally, normal temperatures of the legs and arms, no clubbing, cyanosis and good capillary refill.  Lymphatic: palpation is normal.  Left foot and ankle have full motion and no  swelling today.  NV intact. She has pain of the anterior distal tibia with no redness, no swelling.  Gait is a limp to the left slightly.  The patient has been educated about the nature of the problem(s) and counseled on treatment options.  The patient appeared to understand what I have discussed and is in agreement with it.  Encounter Diagnoses  Name Primary?  . Pain of joint of left ankle and foot Yes  . Cigarette  nicotine dependence without complication     PLAN Call if any problems.  Precautions discussed.  Continue current medications.   Return to clinic 3 weeks   Electronically Signed Darreld Mclean, MD 8/8/201810:43 AM

## 2016-08-28 NOTE — Patient Instructions (Signed)
Steps to Quit Smoking Smoking tobacco can be bad for your health. It can also affect almost every organ in your body. Smoking puts you and people around you at risk for many serious Felicia Greer-lasting (chronic) diseases. Quitting smoking is hard, but it is one of the best things that you can do for your health. It is never too late to quit. What are the benefits of quitting smoking? When you quit smoking, you lower your risk for getting serious diseases and conditions. They can include:  Lung cancer or lung disease.  Heart disease.  Stroke.  Heart attack.  Not being able to have children (infertility).  Weak bones (osteoporosis) and broken bones (fractures).  If you have coughing, wheezing, and shortness of breath, those symptoms may get better when you quit. You may also get sick less often. If you are pregnant, quitting smoking can help to lower your chances of having a baby of low birth weight. What can I do to help me quit smoking? Talk with your doctor about what can help you quit smoking. Some things you can do (strategies) include:  Quitting smoking totally, instead of slowly cutting back how much you smoke over a period of time.  Going to in-person counseling. You are more likely to quit if you go to many counseling sessions.  Using resources and support systems, such as: ? Online chats with a counselor. ? Phone quitlines. ? Printed self-help materials. ? Support groups or group counseling. ? Text messaging programs. ? Mobile phone apps or applications.  Taking medicines. Some of these medicines may have nicotine in them. If you are pregnant or breastfeeding, do not take any medicines to quit smoking unless your doctor says it is okay. Talk with your doctor about counseling or other things that can help you.  Talk with your doctor about using more than one strategy at the same time, such as taking medicines while you are also going to in-person counseling. This can help make  quitting easier. What things can I do to make it easier to quit? Quitting smoking might feel very hard at first, but there is a lot that you can do to make it easier. Take these steps:  Talk to your family and friends. Ask them to support and encourage you.  Call phone quitlines, reach out to support groups, or work with a counselor.  Ask people who smoke to not smoke around you.  Avoid places that make you want (trigger) to smoke, such as: ? Bars. ? Parties. ? Smoke-break areas at work.  Spend time with people who do not smoke.  Lower the stress in your life. Stress can make you want to smoke. Try these things to help your stress: ? Getting regular exercise. ? Deep-breathing exercises. ? Yoga. ? Meditating. ? Doing a body scan. To do this, close your eyes, focus on one area of your body at a time from head to toe, and notice which parts of your body are tense. Try to relax the muscles in those areas.  Download or buy apps on your mobile phone or tablet that can help you stick to your quit plan. There are many free apps, such as QuitGuide from the CDC (Centers for Disease Control and Prevention). You can find more support from smokefree.gov and other websites.  This information is not intended to replace advice given to you by your health care provider. Make sure you discuss any questions you have with your health care provider. Document Released: 11/03/2008 Document   Revised: 09/05/2015 Document Reviewed: 05/24/2014 Elsevier Interactive Patient Education  2018 Elsevier Inc.  

## 2016-08-29 DIAGNOSIS — Z6820 Body mass index (BMI) 20.0-20.9, adult: Secondary | ICD-10-CM | POA: Diagnosis not present

## 2016-08-29 DIAGNOSIS — G894 Chronic pain syndrome: Secondary | ICD-10-CM | POA: Diagnosis not present

## 2016-08-29 DIAGNOSIS — F330 Major depressive disorder, recurrent, mild: Secondary | ICD-10-CM | POA: Diagnosis not present

## 2016-08-29 DIAGNOSIS — F329 Major depressive disorder, single episode, unspecified: Secondary | ICD-10-CM | POA: Diagnosis not present

## 2016-08-29 DIAGNOSIS — F419 Anxiety disorder, unspecified: Secondary | ICD-10-CM | POA: Diagnosis not present

## 2016-08-29 DIAGNOSIS — Z1389 Encounter for screening for other disorder: Secondary | ICD-10-CM | POA: Diagnosis not present

## 2016-09-19 ENCOUNTER — Ambulatory Visit: Payer: BLUE CROSS/BLUE SHIELD | Admitting: Orthopaedic Surgery

## 2016-10-03 ENCOUNTER — Encounter: Payer: Self-pay | Admitting: Adult Health

## 2016-10-03 ENCOUNTER — Ambulatory Visit (INDEPENDENT_AMBULATORY_CARE_PROVIDER_SITE_OTHER): Payer: Self-pay | Admitting: Adult Health

## 2016-10-03 VITALS — BP 138/88 | HR 81 | Ht 63.5 in | Wt 115.0 lb

## 2016-10-03 DIAGNOSIS — Z01419 Encounter for gynecological examination (general) (routine) without abnormal findings: Secondary | ICD-10-CM | POA: Insufficient documentation

## 2016-10-03 DIAGNOSIS — Z1211 Encounter for screening for malignant neoplasm of colon: Secondary | ICD-10-CM

## 2016-10-03 DIAGNOSIS — N941 Unspecified dyspareunia: Secondary | ICD-10-CM

## 2016-10-03 DIAGNOSIS — Z1212 Encounter for screening for malignant neoplasm of rectum: Secondary | ICD-10-CM

## 2016-10-03 DIAGNOSIS — N952 Postmenopausal atrophic vaginitis: Secondary | ICD-10-CM

## 2016-10-03 DIAGNOSIS — Z01411 Encounter for gynecological examination (general) (routine) with abnormal findings: Secondary | ICD-10-CM

## 2016-10-03 LAB — HEMOCCULT GUIAC POC 1CARD (OFFICE): Fecal Occult Blood, POC: NEGATIVE

## 2016-10-03 MED ORDER — ESTROGENS, CONJUGATED 0.625 MG/GM VA CREA: TOPICAL_CREAM | VAGINAL | 0 refills | Status: DC

## 2016-10-03 NOTE — Patient Instructions (Signed)
Pap and physical in 1 year Get mammogram  Use premarin cream 1 gm daily for 2 weeks then 2-3 x weekly

## 2016-10-03 NOTE — Progress Notes (Signed)
Patient ID: Felicia Greer, female   DOB: 07/29/1960, 56 y.o.   MRN: [id number] History of Present Illness: Felicia Greer is a 56 year old white female, married in for well woman gyn exam, had normal pap with negative HPV 02/15/14. PCP is Dr Sherwood GamblerFusco.   Current Medications, Allergies, Past Medical History, Past Surgical History, Family History and Social History were reviewed in [facility] electronic medical record.     Review of Systems: Patient denies any headaches, hearing loss, fatigue, blurred vision, shortness of breath, chest pain, abdominal pain, problems with bowel movements, urination.  No joint pain or mood swings. Had not had sex in years and tried and it was painful and felt like she was tearing she said, and 39th anniversary is coming up in November and wants to be able to have sex. She tried estrace cream about 2 years ago, and vagina felt better then spotted and had odor and stopped using, was just inserting with finger at introitus then.    Physical Exam:BP 138/88 (BP Location: Left Arm, Patient Position: Sitting, Cuff Size: Normal)   Pulse 81   Ht 5' 3.5" (1.613 m)   Wt 115 lb (52.2 kg)   BMI 20.05 kg/m  General:  Well developed, well nourished, no acute distress Skin:  Warm and dry Neck:  Midline trachea, normal thyroid, good ROM, no lymphadenopathy Lungs; Clear to auscultation bilaterally Breast:  No dominant palpable mass, retraction, or nipple discharge Cardiovascular: Regular rate and rhythm Abdomen:  Soft, non tender, no hepatosplenomegaly Pelvic:  External genitalia is normal in appearance, no lesions.  The vagina is pale, and has lost rugae and moisture. Urethra has no lesions or masses. The cervix is smooth.  Uterus is felt to be normal size, shape, and contour.  No adnexal masses or tenderness noted.Bladder is non tender, no masses felt. Rectal: Good sphincter tone, no polyps, or hemorrhoids felt.  Hemoccult negative. Extremities/musculoskeletal:  No swelling or  varicosities noted, no clubbing or cyanosis Psych:  No mood changes, alert and cooperative,seems happy PHQ 2 score 0. Will try premarin vaginal cream.  Impression: 1. Encounter for well woman exam with routine gynecological exam   2. Screening for colorectal cancer   3. Dyspareunia, female   4. Vaginal atrophy       Plan: Pap and physical in 1 year Get mammogram  Use premarin cream 1 gm daily for 2 weeks then 2-3 x weekly  Given 5 tubes,(20 gm), premarin vaginal cream lot [id number], exp 11/19 Start massaging introitus and stretch with fingers  Call me in about 3 weeks for F/U  Labs with PCP

## 2016-11-25 ENCOUNTER — Telehealth: Payer: Self-pay

## 2016-11-25 NOTE — Telephone Encounter (Signed)
Left [facility] to call to schedule screening colonoscopy.

## 2016-11-27 NOTE — Telephone Encounter (Signed)
LMOM to call.

## 2016-12-17 ENCOUNTER — Encounter: Payer: Self-pay | Admitting: Obstetrics & Gynecology

## 2016-12-17 ENCOUNTER — Ambulatory Visit (INDEPENDENT_AMBULATORY_CARE_PROVIDER_SITE_OTHER): Payer: Self-pay | Admitting: Obstetrics & Gynecology

## 2016-12-17 VITALS — BP 120/90 | HR 94 | Ht 64.0 in | Wt 115.8 lb

## 2016-12-17 DIAGNOSIS — R319 Hematuria, unspecified: Secondary | ICD-10-CM

## 2016-12-17 DIAGNOSIS — N952 Postmenopausal atrophic vaginitis: Secondary | ICD-10-CM

## 2016-12-17 DIAGNOSIS — R300 Dysuria: Secondary | ICD-10-CM

## 2016-12-17 LAB — POCT URINALYSIS DIPSTICK
GLUCOSE UA: NEGATIVE
Ketones, UA: NEGATIVE
Leukocytes, UA: NEGATIVE
Nitrite, UA: NEGATIVE

## 2016-12-17 NOTE — Progress Notes (Signed)
  See note by Dr Artist PaisYoo

## 2016-12-17 NOTE — Progress Notes (Addendum)
Felicia Greer is an 56 y.o. female here for follow up of vaginal atrophy.  History of Present Illness: Last seen on 10/03/16 and evaluated for vaginal atrophy, was instructed to do premarin cream 1 gm daily for 2 weeks then 2-3x weekly. States that used briefly but then stopped due to difficulty affording medication. Since has been using vinegar and hydrogen peroxide douches, eating yogurt, OTC monistat with minimal to no relief. Has had continued burning and itching and pain in vaginal area. Also has some burning with urination.  Pertinent Gynecological History: Menses: post-menopausal and s/p endometrial ablation Last mammogram: normal Date: 11/10/2014 Last pap: normal Date: 02/15/14   Menstrual History: No LMP recorded. Patient has had an ablation.    Social History:  reports that she has been smoking e-cigarettes.  She started smoking about 44 years ago. She has been smoking about 0.00 packs per day for the past 38.00 years. she has never used smokeless tobacco. She reports that she does not drink alcohol or use drugs. Recently restart smoking cigarettes.   ROS: + burning with urination, no frequency or no urgency No nausea, vomiting or diarrhea Nor fever chills or other constitutional symptoms   Blood pressure 120/90, pulse 94, height 5\' 4"  (1.626 m), weight 115 lb 12.8 oz (52.5 kg). Physical Exam  General WDWN female NAD Vulva:  normal appearing vulva with no masses, tenderness or lesions Vagina:  atrophic, no  lesions Cervix:  no lesions  Microscopic wet-mount exam shows negative for pathogens, normal epithelial cells. Urine dipstick shows positive for RBC's and positive for protein.   Assessment/Plan: Vaginal atrophy - use premarin cream 1 gm daily at bedtime - given 3 tubes (20gm) premarin vaginal cream - counseled against using home remedies - Urine cx sent - Follow up with Dr. Despina HiddenEure in 6 weeks  Leland HerElsia J Yoo, DO PGY-2, [facility] 12/17/2016 10:14 AM

## 2016-12-18 ENCOUNTER — Ambulatory Visit: Payer: Self-pay | Admitting: Adult Health

## 2016-12-19 ENCOUNTER — Telehealth: Payer: Self-pay | Admitting: *Deleted

## 2016-12-19 ENCOUNTER — Other Ambulatory Visit: Payer: Self-pay | Admitting: Obstetrics & Gynecology

## 2016-12-19 MED ORDER — CIPROFLOXACIN HCL 500 MG PO TABS: 500.0000 mg | ORAL_TABLET | Freq: Two times a day (BID) | ORAL | 0 refills | Status: DC

## 2016-12-19 NOTE — Telephone Encounter (Signed)
LMOVM to return call.

## 2016-12-19 NOTE — Telephone Encounter (Signed)
Patient called stating she is still experiencing burning with urination and itching. Has only been using the premarin cream for 2 days. Informed patient her urine culture came back for e coli so cipro was sent to pharmacy today. Advised to take medication and if burning does not get better to let us know. Informed antibiotic may cause yeast infection, so increase yogurt intake but to let us know if it gets worse. Verbalized understanding.

## 2016-12-20 LAB — URINE CULTURE

## 2017-01-06 ENCOUNTER — Telehealth: Payer: Self-pay | Admitting: Obstetrics & Gynecology

## 2017-01-06 NOTE — Telephone Encounter (Signed)
LMOVM that 3 sample boxes of Premarin left at front desk.

## 2017-01-16 NOTE — Telephone Encounter (Signed)
Letter was mailed 12/05/2017 to call to schedule colonoscopy.

## 2017-01-24 DIAGNOSIS — Z1231 Encounter for screening mammogram for malignant neoplasm of breast: Secondary | ICD-10-CM | POA: Diagnosis not present

## 2017-01-28 ENCOUNTER — Ambulatory Visit: Payer: BLUE CROSS/BLUE SHIELD | Admitting: Obstetrics & Gynecology

## 2017-01-28 ENCOUNTER — Encounter: Payer: Self-pay | Admitting: Obstetrics & Gynecology

## 2017-01-28 VITALS — BP 126/70 | Ht 64.0 in | Wt 115.0 lb

## 2017-01-28 DIAGNOSIS — R300 Dysuria: Secondary | ICD-10-CM | POA: Diagnosis not present

## 2017-01-28 DIAGNOSIS — N952 Postmenopausal atrophic vaginitis: Secondary | ICD-10-CM | POA: Diagnosis not present

## 2017-01-28 MED ORDER — ESTROGENS, CONJUGATED 0.625 MG/GM VA CREA: 1.0000 | TOPICAL_CREAM | Freq: Every day | VAGINAL | 11 refills | Status: DC

## 2017-01-28 NOTE — Progress Notes (Signed)
Chief Complaint  Patient presents with  . Follow-up      57 y.o. G3P3003 No LMP recorded. Patient has had an ablation. The current method of family planning is tubal ligation.  Outpatient Encounter Medications as of 01/28/2017  Medication Sig Note  . ALPRAZolam (XANAX) 1 MG tablet Take 1 tablet by mouth 2 (two) times daily.  12/21/2014: Received from: External Pharmacy Received Sig:   . Calcium Carbonate-Vit D-Min (CALCIUM 1200 PO) Take by mouth daily.   . Cholecalciferol (VITAMIN D3) 5000 UNITS CAPS Take 1 capsule by mouth daily.   Marland Kitchen conjugated estrogens (PREMARIN) vaginal cream Use 1 gm every day in vagina for 2 weeks then 2-3 x weekly   . HYDROcodone-acetaminophen (NORCO/VICODIN) 5-325 MG tablet Take 1 tablet by mouth 4 (four) times daily as needed. 10/03/2016: qhs prn  . meloxicam (MOBIC) 7.5 MG tablet Take 1 tablet by mouth 2 (two) times daily as needed.  12/21/2014: Received from: External Pharmacy Received Sig:   . Multiple Vitamins-Minerals (MULTIVITAMIN PO) Take 1 tablet by mouth daily.   . nortriptyline (PAMELOR) 25 MG capsule Take 1 capsule (25 mg total) by mouth at bedtime.   . promethazine (PHENERGAN) 12.5 MG tablet Take 1 tablet (12.5 mg total) by mouth every 6 (six) hours as needed for nausea or vomiting.   . risedronate (ACTONEL) 150 MG tablet take 1 tablet by mouth every month WITH WATER ON EMPTY STOMACH DO NOT LIE DOWN FOR 30 MINUTES   . ciprofloxacin (CIPRO) 500 MG tablet Take 1 tablet (500 mg total) by mouth 2 (two) times daily. (Patient not taking: Reported on 01/28/2017)   . conjugated estrogens (PREMARIN) vaginal cream Place 1 Applicatorful vaginally daily.   Marland Kitchen tiZANidine (ZANAFLEX) 2 MG tablet Take 1 tablet (2 mg total) by mouth every 8 (eight) hours as needed for muscle spasms.    No facility-administered encounter medications on file as of 01/28/2017.     Subjective Pt is here for re evaluation of her vaginal atrophy and UTI Has been taking the vaginal  premarin now for 2+ months with good results, no burning or irritation Treated for UTI and her symptoms have abated for the most part No bleeding  Has not had intercourse yet, maybe this week end Past Medical History:  Diagnosis Date  . Acid reflux   . Arthritis    pelvic bone  . Back pain   . BV (bacterial vaginosis) 4 18 2012  . COPD (chronic obstructive pulmonary disease) (HCC) 2010  . Dyspareunia, female 01/05/2015  . Hx of goiter   . Hx: UTI (urinary tract infection)   . Osteopenia determined by x-ray 03/10/2014  . Pelvic pain in female 03/01/2014  . Rhinitis 02/15/2014  . Urinary frequency 01/05/2015  . Vaginal odor 01/05/2015    Past Surgical History:  Procedure Laterality Date  . BIOPSY THYROID N/A 06/2009   had fna  . BREAST BIOPSY Right in the 1990s  . BREAST BIOPSY  1980   benign  . CERVICAL FUSION N/A in the 1990s  . HYSTEROSCOPY W/ ENDOMETRIAL ABLATION N/A 2004  . TUBAL LIGATION      OB History    Gravida Para Term Preterm AB Living   3 3 3     3    SAB TAB Ectopic Multiple Live Births           3      Allergies  Allergen Reactions  . Codeine Nausea Only  . Metrogel [Metronidazole]  Had local reaction  . Protonix [Pantoprazole Sodium] Rash    Social History   Socioeconomic History  . Marital status: Married    Spouse name: None  . Number of children: None  . Years of education: None  . Highest education level: None  Social Needs  . Financial resource strain: None  . Food insecurity - worry: None  . Food insecurity - inability: None  . Transportation needs - medical: None  . Transportation needs - non-medical: None  Occupational History  . None  Tobacco Use  . Smoking status: Current Every Day Smoker    Packs/day: 0.00    Years: 38.00    Pack years: 0.00    Types: E-cigarettes    Start date: 10/25/1972    Last attempt to quit: 12/21/2010    Years since quitting: 6.1  . Smokeless tobacco: Never Used  . Tobacco comment: use electronic  cigs  Substance and Sexual Activity  . Alcohol use: No    Alcohol/week: 0.0 oz  . Drug use: No  . Sexual activity: Not Currently    Partners: Male    Birth control/protection: Surgical    Comment: ablation and tubal  Other Topics Concern  . None  Social History Narrative   Lives with husband in a one story home with a basement.  Has 3 children.  Education: 10th grade.  Stays at home watching her grandkids.    Family History  Problem Relation Age of Onset  . Cancer Maternal Aunt        cervical   . Breast cancer Maternal Grandmother   . Breast cancer Other   . Fibromyalgia Mother   . Other Mother        sciatic nerve; bad shoulder  . Colon cancer Mother   . Other Father        collapsed lung  . COPD Father   . Heart attack Father   . Other Sister        valves replaced in heart  . Heart attack Sister   . Anxiety disorder Brother   . Other Brother        takes med for heart  . Anxiety disorder Daughter   . Emphysema Maternal Grandfather   . Heart attack Paternal Grandmother   . Breast cancer Maternal Aunt   . Cancer Other        breast    Medications:       Current Outpatient Medications:  .  ALPRAZolam (XANAX) 1 MG tablet, Take 1 tablet by mouth 2 (two) times daily. , Disp: , Rfl: 0 .  Calcium Carbonate-Vit D-Min (CALCIUM 1200 PO), Take by mouth daily., Disp: , Rfl:  .  Cholecalciferol (VITAMIN D3) 5000 UNITS CAPS, Take 1 capsule by mouth daily., Disp: , Rfl:  .  conjugated estrogens (PREMARIN) vaginal cream, Use 1 gm every day in vagina for 2 weeks then 2-3 x weekly, Disp: 20 g, Rfl: 0 .  HYDROcodone-acetaminophen (NORCO/VICODIN) 5-325 MG tablet, Take 1 tablet by mouth 4 (four) times daily as needed., Disp: , Rfl: 0 .  meloxicam (MOBIC) 7.5 MG tablet, Take 1 tablet by mouth 2 (two) times daily as needed. , Disp: , Rfl: 0 .  Multiple Vitamins-Minerals (MULTIVITAMIN PO), Take 1 tablet by mouth daily., Disp: , Rfl:  .  nortriptyline (PAMELOR) 25 MG capsule, Take 1  capsule (25 mg total) by mouth at bedtime., Disp: 30 capsule, Rfl: 2 .  promethazine (PHENERGAN) 12.5 MG tablet, Take 1 tablet (12.5 mg  total) by mouth every 6 (six) hours as needed for nausea or vomiting., Disp: 30 tablet, Rfl: 0 .  risedronate (ACTONEL) 150 MG tablet, take 1 tablet by mouth every month WITH WATER ON EMPTY STOMACH DO NOT LIE DOWN FOR 30 MINUTES, Disp: 1 tablet, Rfl: 12 .  ciprofloxacin (CIPRO) 500 MG tablet, Take 1 tablet (500 mg total) by mouth 2 (two) times daily. (Patient not taking: Reported on 01/28/2017), Disp: 14 tablet, Rfl: 0 .  conjugated estrogens (PREMARIN) vaginal cream, Place 1 Applicatorful vaginally daily., Disp: 30 g, Rfl: 11 .  tiZANidine (ZANAFLEX) 2 MG tablet, Take 1 tablet (2 mg total) by mouth every 8 (eight) hours as needed for muscle spasms., Disp: 90 tablet, Rfl: 0  Objective Blood pressure 126/70, height 5\' 4"  (1.626 m), weight 115 lb (52.2 kg).  General WDWN female NAD Vulva:  normal appearing vulva with no masses, tenderness or lesions Vagina:  normal mucosa, no discharge Cervix:  no cervical motion tenderness and no lesions Uterus:  normal size, contour, position, consistency, mobility, non-tender Adnexa: ovaries:present,  normal adnexa in size, nontender and no masses   Pertinent ROS No burning with urination, frequency or urgency No nausea, vomiting or diarrhea Nor fever chills or other constitutional symptoms   Labs or studies Will recheck urine culture tody    Impression Diagnoses this Encounter::   ICD-10-CM   1. Vaginal atrophy N95.2   2. Burning with urination R30.0     Established relevant diagnosis(es):   Plan/Recommendations: Meds ordered this encounter  Medications  . conjugated estrogens (PREMARIN) vaginal cream    Sig: Place 1 Applicatorful vaginally daily.    Dispense:  30 g    Refill:  11    Labs or Scans Ordered: No orders of the defined types were placed in this encounter.   Management:: Decrease her  premarin to every other night Urine culture today  Follow up Return in about 6 months (around 07/28/2017) for yearly with Victorino DikeJennifer.      All questions were answered.

## 2017-01-28 NOTE — Addendum Note (Signed)
Addended by: Sherre LainASH, AMANDA A on: 01/28/2017 02:40 PM   Modules accepted: Orders

## 2017-01-30 LAB — URINE CULTURE: ORGANISM ID, BACTERIA: NO GROWTH

## 2017-03-05 DIAGNOSIS — Z681 Body mass index (BMI) 19 or less, adult: Secondary | ICD-10-CM | POA: Diagnosis not present

## 2017-03-05 DIAGNOSIS — F419 Anxiety disorder, unspecified: Secondary | ICD-10-CM | POA: Diagnosis not present

## 2017-03-05 DIAGNOSIS — G894 Chronic pain syndrome: Secondary | ICD-10-CM | POA: Diagnosis not present

## 2017-05-30 DIAGNOSIS — G894 Chronic pain syndrome: Secondary | ICD-10-CM | POA: Diagnosis not present

## 2017-05-30 DIAGNOSIS — Z1389 Encounter for screening for other disorder: Secondary | ICD-10-CM | POA: Diagnosis not present

## 2017-05-30 DIAGNOSIS — F330 Major depressive disorder, recurrent, mild: Secondary | ICD-10-CM | POA: Diagnosis not present

## 2017-05-30 DIAGNOSIS — F329 Major depressive disorder, single episode, unspecified: Secondary | ICD-10-CM | POA: Diagnosis not present

## 2017-05-30 DIAGNOSIS — Z6820 Body mass index (BMI) 20.0-20.9, adult: Secondary | ICD-10-CM | POA: Diagnosis not present

## 2017-06-01 DIAGNOSIS — I10 Essential (primary) hypertension: Secondary | ICD-10-CM | POA: Diagnosis not present

## 2017-06-01 DIAGNOSIS — R079 Chest pain, unspecified: Secondary | ICD-10-CM | POA: Diagnosis not present

## 2017-06-01 DIAGNOSIS — R42 Dizziness and giddiness: Secondary | ICD-10-CM | POA: Diagnosis not present

## 2017-06-01 DIAGNOSIS — R0789 Other chest pain: Secondary | ICD-10-CM | POA: Diagnosis not present

## 2017-06-01 DIAGNOSIS — R51 Headache: Secondary | ICD-10-CM | POA: Diagnosis not present

## 2017-06-02 DIAGNOSIS — I499 Cardiac arrhythmia, unspecified: Secondary | ICD-10-CM | POA: Diagnosis not present

## 2017-06-12 DIAGNOSIS — Z1389 Encounter for screening for other disorder: Secondary | ICD-10-CM | POA: Diagnosis not present

## 2017-06-12 DIAGNOSIS — K219 Gastro-esophageal reflux disease without esophagitis: Secondary | ICD-10-CM | POA: Diagnosis not present

## 2017-06-12 DIAGNOSIS — Z6820 Body mass index (BMI) 20.0-20.9, adult: Secondary | ICD-10-CM | POA: Diagnosis not present

## 2017-06-12 DIAGNOSIS — R51 Headache: Secondary | ICD-10-CM | POA: Diagnosis not present

## 2017-06-12 DIAGNOSIS — R079 Chest pain, unspecified: Secondary | ICD-10-CM | POA: Diagnosis not present

## 2017-06-12 DIAGNOSIS — I10 Essential (primary) hypertension: Secondary | ICD-10-CM | POA: Diagnosis not present

## 2017-06-20 DIAGNOSIS — Z6820 Body mass index (BMI) 20.0-20.9, adult: Secondary | ICD-10-CM | POA: Diagnosis not present

## 2017-06-20 DIAGNOSIS — Z1389 Encounter for screening for other disorder: Secondary | ICD-10-CM | POA: Diagnosis not present

## 2017-06-20 DIAGNOSIS — I10 Essential (primary) hypertension: Secondary | ICD-10-CM | POA: Diagnosis not present

## 2017-09-08 ENCOUNTER — Ambulatory Visit: Payer: BLUE CROSS/BLUE SHIELD | Admitting: Adult Health

## 2017-09-08 ENCOUNTER — Encounter: Payer: Self-pay | Admitting: Adult Health

## 2017-09-08 VITALS — BP 123/80 | HR 104 | Ht 64.0 in | Wt 118.5 lb

## 2017-09-08 DIAGNOSIS — G43909 Migraine, unspecified, not intractable, without status migrainosus: Secondary | ICD-10-CM | POA: Diagnosis not present

## 2017-09-08 DIAGNOSIS — K5900 Constipation, unspecified: Secondary | ICD-10-CM | POA: Diagnosis not present

## 2017-09-08 DIAGNOSIS — N816 Rectocele: Secondary | ICD-10-CM | POA: Diagnosis not present

## 2017-09-08 DIAGNOSIS — Z6820 Body mass index (BMI) 20.0-20.9, adult: Secondary | ICD-10-CM | POA: Diagnosis not present

## 2017-09-08 DIAGNOSIS — Z0000 Encounter for general adult medical examination without abnormal findings: Secondary | ICD-10-CM | POA: Diagnosis not present

## 2017-09-08 DIAGNOSIS — G894 Chronic pain syndrome: Secondary | ICD-10-CM | POA: Diagnosis not present

## 2017-09-08 DIAGNOSIS — Z1389 Encounter for screening for other disorder: Secondary | ICD-10-CM | POA: Diagnosis not present

## 2017-09-08 DIAGNOSIS — F419 Anxiety disorder, unspecified: Secondary | ICD-10-CM | POA: Diagnosis not present

## 2017-09-08 DIAGNOSIS — M810 Age-related osteoporosis without current pathological fracture: Secondary | ICD-10-CM | POA: Diagnosis not present

## 2017-09-08 NOTE — Progress Notes (Addendum)
  Subjective:     Patient ID: Lorelei PontSherry Hassinger, female   DOB: 03/09/1960, 57 y.o.   MRN: [id number]  HPI [name] is a 57 year old white female, married, worked in, for feeling lump in vagina.  Review of Systems Felt lump in vagina last night in shower +constipated, may not go for 4 days She stopped PVC, BP was elevated Reviewed past medical,surgical, social and family history. Reviewed medications and allergies.     Objective:   Physical Exam BP 123/80 (BP Location: Left Arm, Patient Position: Sitting, Cuff Size: Normal)   Pulse (!) 104   Ht 5\' 4"  (1.626 m)   Wt 118 lb 8 oz (53.8 kg)   BMI 20.34 kg/m   Skin warm and dry.Pelvic: external genitalia is normal in appearance no lesions, vagina: pale pink with loss of moisture and rugae,urethra has no lesions or masses noted, cervix: bulbous, uterus: normal size, shape and contour, non tender, no masses felt, adnexa: no masses or tenderness noted. Bladder is non tender and no masses felt. On rectal exam, has good tone, no masses, +rectocele and stool in vault. Examination chaperoned by Marchelle FolksAmanda Rash LPN.    Assessment:     1. Rectocele   2. Constipation, unspecified constipation type       Plan:     Try miralax Reviewed medical explainer #4  F/U in about 5 weeks for pap and physical

## 2017-10-13 ENCOUNTER — Other Ambulatory Visit: Payer: BLUE CROSS/BLUE SHIELD | Admitting: Adult Health

## 2017-10-25 ENCOUNTER — Other Ambulatory Visit (HOSPITAL_COMMUNITY)
Admission: RE | Admit: 2017-10-25 | Discharge: 2017-10-25 | Disposition: A | Discharge status: Home / Self Care | Payer: BLUE CROSS/BLUE SHIELD | Source: Ambulatory Visit | Attending: Adult Health | Admitting: Adult Health

## 2017-10-25 DIAGNOSIS — Z01419 Encounter for gynecological examination (general) (routine) without abnormal findings: Secondary | ICD-10-CM | POA: Insufficient documentation

## 2017-10-27 ENCOUNTER — Encounter: Payer: Self-pay | Admitting: Adult Health

## 2017-10-27 ENCOUNTER — Other Ambulatory Visit: Payer: Self-pay

## 2017-10-27 ENCOUNTER — Ambulatory Visit: Payer: BLUE CROSS/BLUE SHIELD | Admitting: Adult Health

## 2017-10-27 VITALS — BP 124/82 | HR 78 | Ht 64.0 in | Wt 118.0 lb

## 2017-10-27 DIAGNOSIS — N952 Postmenopausal atrophic vaginitis: Secondary | ICD-10-CM | POA: Diagnosis not present

## 2017-10-27 DIAGNOSIS — Z01419 Encounter for gynecological examination (general) (routine) without abnormal findings: Secondary | ICD-10-CM

## 2017-10-27 DIAGNOSIS — K5900 Constipation, unspecified: Secondary | ICD-10-CM

## 2017-10-27 DIAGNOSIS — Z1211 Encounter for screening for malignant neoplasm of colon: Secondary | ICD-10-CM

## 2017-10-27 DIAGNOSIS — Z1212 Encounter for screening for malignant neoplasm of rectum: Secondary | ICD-10-CM | POA: Diagnosis not present

## 2017-10-27 LAB — HEMOCCULT GUIAC POC 1CARD (OFFICE): Fecal Occult Blood, POC: NEGATIVE

## 2017-10-27 MED ORDER — LINACLOTIDE 145 MCG PO CAPS: 145.0000 ug | ORAL_CAPSULE | Freq: Every day | ORAL | 6 refills | Status: DC

## 2017-10-27 NOTE — Progress Notes (Signed)
Patient ID: Felicia Greer, female   DOB: 02-25-1960, 57 y.o.   MRN: [id number] History of Present Illness:  Augustine is a 57 year old white female, married, PM in for well woman gyn exam and pap. She is caring for her mom now, who lives with her, and her sister will not help. PCP is DR Sherwood Gambler.   Current Medications, Allergies, Past Medical History, Past Surgical History, Family History and Social History were reviewed in [employer] record.     Review of Systems: Patient denies any headaches, hearing loss, fatigue, blurred vision, shortness of breath, chest pain, abdominal pain, problems with bowel movements(constipated at times), urination, or intercourse(not active). No joint pain or mood swings.    Physical Exam:BP 124/82 (BP Location: Left Arm, Patient Position: Sitting, Cuff Size: Normal)   Pulse 78   Ht 5\' 4"  (1.626 m)   Wt 118 lb (53.5 kg)   BMI 20.25 kg/m  General:  Well developed, well nourished, no acute distress Skin:  Warm and dry Neck:  Midline trachea, normal thyroid, good ROM, no lymphadenopathy Lungs; Clear to auscultation bilaterally Breast:  No dominant palpable mass, retraction, or nipple discharge Cardiovascular: Regular rate and rhythm Abdomen:  Soft, non tender, no hepatosplenomegaly Pelvic:  External genitalia is normal in appearance, no lesions.  The vagina is pale, with loss of moisture and rugae. Urethra has no lesions or masses. The cervix is smooth, pap with HPV performed.  Uterus is felt to be normal size, shape, and contour.  No adnexal masses or tenderness noted.Bladder is non tender, no masses felt. Rectal: Good sphincter tone, no polyps, or hemorrhoids felt. Mild rectocele, Hemoccult negative. Extremities/musculoskeletal:  No swelling or varicosities noted, no clubbing or cyanosis Psych:  No mood changes, alert and cooperative,seems happy PHQ 2 score 0. Examination chaperoned by Marchelle Folks Rash LPN.  Impression: 1. Encounter for  gynecological examination with Papanicolaou smear of cervix   2. Screening for colorectal cancer   3. Constipation, unspecified constipation type   4. Vaginal atrophy       Plan: Get mammogram  Physical in 1 year Pap in 3 if normal Labs with PCP Colonoscopy advised  Try miralax or can try linzess Meds ordered this encounter  Medications  . linaclotide (LINZESS) 145 MCG CAPS capsule    Sig: Take 1 capsule (145 mcg total) by mouth daily before breakfast.    Dispense:  30 capsule    Refill:  6    Order Specific Question:   Supervising Provider    Answer:   Duane Lope H [[id number]]

## 2017-10-29 LAB — CYTOLOGY - PAP
DIAGNOSIS: NEGATIVE
HPV (WINDOPATH): NOT DETECTED

## 2018-01-02 DIAGNOSIS — G894 Chronic pain syndrome: Secondary | ICD-10-CM | POA: Diagnosis not present

## 2018-01-02 DIAGNOSIS — Z681 Body mass index (BMI) 19 or less, adult: Secondary | ICD-10-CM | POA: Diagnosis not present

## 2018-01-02 DIAGNOSIS — F419 Anxiety disorder, unspecified: Secondary | ICD-10-CM | POA: Diagnosis not present

## 2018-01-02 DIAGNOSIS — Z1389 Encounter for screening for other disorder: Secondary | ICD-10-CM | POA: Diagnosis not present

## 2018-01-30 ENCOUNTER — Encounter: Payer: Self-pay | Admitting: Adult Health

## 2018-01-30 ENCOUNTER — Ambulatory Visit: Payer: BLUE CROSS/BLUE SHIELD | Admitting: Adult Health

## 2018-01-30 VITALS — BP 129/81 | HR 75 | Ht 63.0 in | Wt 114.0 lb

## 2018-01-30 DIAGNOSIS — N390 Urinary tract infection, site not specified: Secondary | ICD-10-CM

## 2018-01-30 DIAGNOSIS — R309 Painful micturition, unspecified: Secondary | ICD-10-CM | POA: Diagnosis not present

## 2018-01-30 DIAGNOSIS — R319 Hematuria, unspecified: Secondary | ICD-10-CM

## 2018-01-30 LAB — POCT URINALYSIS DIPSTICK OB
GLUCOSE, UA: NEGATIVE
Ketones, UA: NEGATIVE
Nitrite, UA: POSITIVE
POC,PROTEIN,UA: NEGATIVE

## 2018-01-30 MED ORDER — PHENAZOPYRIDINE HCL 200 MG PO TABS: 200.0000 mg | ORAL_TABLET | Freq: Three times a day (TID) | ORAL | 0 refills | Status: DC | PRN

## 2018-01-30 MED ORDER — SULFAMETHOXAZOLE-TRIMETHOPRIM 800-160 MG PO TABS: 1.0000 | ORAL_TABLET | Freq: Two times a day (BID) | ORAL | 0 refills | Status: DC

## 2018-01-30 MED ORDER — RISEDRONATE SODIUM 150 MG PO TABS: ORAL_TABLET | ORAL | 12 refills | Status: DC

## 2018-01-30 NOTE — Progress Notes (Signed)
Patient ID: Felicia Greer, female   DOB: 06/14/1960, 58 y.o.   MRN: [id number] History of Present Illness: Felicia Greer is a 58 year old white female, worked in for pain with urination,low back pain and nausea. She is taking AZO.  PCP is Dr Sherwood GamblerFusco.    Current Medications, Allergies, Past Medical History, Past Surgical History, Family History and Social History were reviewed in [facility] electronic medical record.     Review of Systems: +painand burning with urination Low back pain Nausea Requests refill on actonel    Physical Exam:BP 129/81 (BP Location: Left Arm, Patient Position: Sitting, Cuff Size: Normal)   Pulse 75   Ht 5\' 3"  (1.6 m)   Wt 114 lb (51.7 kg)   BMI 20.19 kg/m  Urine dipstick is +nitrates, blood and leuks. Temp was 98.2 General:  Well developed, well nourished, no acute distress Skin:  Warm and dry No CVAT noted. Psych:  No mood changes, alert and cooperative,seems happy   Impression: 1. Urinary tract infection with hematuria, site unspecified   2. Pain with urination       Plan: Meds ordered this encounter  Medications  . sulfamethoxazole-trimethoprim (BACTRIM DS,SEPTRA DS) 800-160 MG tablet    Sig: Take 1 tablet by mouth 2 (two) times daily. Take 1 bid    Dispense:  14 tablet    Refill:  0    Order Specific Question:   Supervising Provider    Answer:   Despina HiddenEURE, LUTHER H [[id number]]  . phenazopyridine (PYRIDIUM) 200 MG tablet    Sig: Take 1 tablet (200 mg total) by mouth 3 (three) times daily as needed for pain.    Dispense:  10 tablet    Refill:  0    Order Specific Question:   Supervising Provider    Answer:   Despina HiddenEURE, LUTHER H [[id number]]  . risedronate (ACTONEL) 150 MG tablet    Sig: take 1 tablet by mouth every month WITH WATER ON EMPTY STOMACH DO NOT LIE DOWN FOR 30 MINUTES    Dispense:  1 tablet    Refill:  12    Order Specific Question:   Supervising Provider    Answer:   Duane LopeEURE, LUTHER H [[id number]]  PUSH fluids UA C&S sent If pain increases or fever go to  ER She has nausea meds at home  F/U prn

## 2018-01-31 LAB — URINALYSIS, ROUTINE W REFLEX MICROSCOPIC
Bilirubin, UA: NEGATIVE
Glucose, UA: NEGATIVE
Ketones, UA: NEGATIVE
Nitrite, UA: POSITIVE — AB
PH UA: 6.5 (ref 5.0–7.5)
PROTEIN UA: NEGATIVE
Specific Gravity, UA: 1.005 (ref 1.005–1.030)
UUROB: 0.2 mg/dL (ref 0.2–1.0)

## 2018-01-31 LAB — MICROSCOPIC EXAMINATION: CASTS: NONE SEEN /LPF

## 2018-02-01 LAB — URINE CULTURE

## 2018-02-02 ENCOUNTER — Telehealth: Payer: Self-pay | Admitting: Adult Health

## 2018-02-02 NOTE — Telephone Encounter (Signed)
Left message that urine was + Ecoli, and septra ds should take care of it, finish all the medication

## 2018-05-13 ENCOUNTER — Ambulatory Visit (HOSPITAL_COMMUNITY)
Admission: RE | Admit: 2018-05-13 | Discharge: 2018-05-13 | Disposition: A | Discharge status: Home / Self Care | Payer: Self-pay | Source: Ambulatory Visit | Attending: Internal Medicine | Admitting: Internal Medicine

## 2018-05-13 ENCOUNTER — Other Ambulatory Visit (HOSPITAL_COMMUNITY): Payer: Self-pay | Admitting: Internal Medicine

## 2018-05-13 ENCOUNTER — Other Ambulatory Visit: Payer: Self-pay

## 2018-05-13 DIAGNOSIS — R079 Chest pain, unspecified: Secondary | ICD-10-CM

## 2018-12-01 ENCOUNTER — Other Ambulatory Visit: Payer: Self-pay

## 2018-12-01 ENCOUNTER — Encounter (HOSPITAL_COMMUNITY): Payer: Self-pay

## 2018-12-01 ENCOUNTER — Emergency Department (HOSPITAL_COMMUNITY): Payer: Self-pay

## 2018-12-01 ENCOUNTER — Observation Stay (HOSPITAL_COMMUNITY)
Admission: EM | Admit: 2018-12-01 | Discharge: 2018-12-02 | Disposition: A | Discharge status: Home / Self Care | Payer: Self-pay | Attending: Family Medicine | Admitting: Family Medicine

## 2018-12-01 DIAGNOSIS — Z87440 Personal history of urinary (tract) infections: Secondary | ICD-10-CM | POA: Insufficient documentation

## 2018-12-01 DIAGNOSIS — E049 Nontoxic goiter, unspecified: Secondary | ICD-10-CM | POA: Insufficient documentation

## 2018-12-01 DIAGNOSIS — Z981 Arthrodesis status: Secondary | ICD-10-CM | POA: Insufficient documentation

## 2018-12-01 DIAGNOSIS — Z800 Family history of malignant neoplasm of digestive organs: Secondary | ICD-10-CM | POA: Insufficient documentation

## 2018-12-01 DIAGNOSIS — Z8249 Family history of ischemic heart disease and other diseases of the circulatory system: Secondary | ICD-10-CM | POA: Insufficient documentation

## 2018-12-01 DIAGNOSIS — Z20828 Contact with and (suspected) exposure to other viral communicable diseases: Secondary | ICD-10-CM | POA: Insufficient documentation

## 2018-12-01 DIAGNOSIS — M1990 Unspecified osteoarthritis, unspecified site: Secondary | ICD-10-CM | POA: Insufficient documentation

## 2018-12-01 DIAGNOSIS — F17210 Nicotine dependence, cigarettes, uncomplicated: Secondary | ICD-10-CM | POA: Insufficient documentation

## 2018-12-01 DIAGNOSIS — Z79899 Other long term (current) drug therapy: Secondary | ICD-10-CM | POA: Insufficient documentation

## 2018-12-01 DIAGNOSIS — Z8049 Family history of malignant neoplasm of other genital organs: Secondary | ICD-10-CM | POA: Insufficient documentation

## 2018-12-01 DIAGNOSIS — R350 Frequency of micturition: Secondary | ICD-10-CM | POA: Insufficient documentation

## 2018-12-01 DIAGNOSIS — J441 Chronic obstructive pulmonary disease with (acute) exacerbation: Secondary | ICD-10-CM | POA: Insufficient documentation

## 2018-12-01 DIAGNOSIS — I10 Essential (primary) hypertension: Secondary | ICD-10-CM | POA: Insufficient documentation

## 2018-12-01 DIAGNOSIS — Z791 Long term (current) use of non-steroidal anti-inflammatories (NSAID): Secondary | ICD-10-CM | POA: Insufficient documentation

## 2018-12-01 DIAGNOSIS — K219 Gastro-esophageal reflux disease without esophagitis: Secondary | ICD-10-CM | POA: Insufficient documentation

## 2018-12-01 DIAGNOSIS — J984 Other disorders of lung: Secondary | ICD-10-CM | POA: Insufficient documentation

## 2018-12-01 DIAGNOSIS — Z818 Family history of other mental and behavioral disorders: Secondary | ICD-10-CM | POA: Insufficient documentation

## 2018-12-01 DIAGNOSIS — G4700 Insomnia, unspecified: Secondary | ICD-10-CM | POA: Insufficient documentation

## 2018-12-01 DIAGNOSIS — R200 Anesthesia of skin: Secondary | ICD-10-CM | POA: Insufficient documentation

## 2018-12-01 DIAGNOSIS — M8580 Other specified disorders of bone density and structure, unspecified site: Secondary | ICD-10-CM | POA: Insufficient documentation

## 2018-12-01 DIAGNOSIS — N941 Unspecified dyspareunia: Secondary | ICD-10-CM | POA: Insufficient documentation

## 2018-12-01 DIAGNOSIS — N760 Acute vaginitis: Secondary | ICD-10-CM | POA: Insufficient documentation

## 2018-12-01 DIAGNOSIS — F32A Depression, unspecified: Secondary | ICD-10-CM | POA: Diagnosis present

## 2018-12-01 DIAGNOSIS — Z885 Allergy status to narcotic agent status: Secondary | ICD-10-CM | POA: Insufficient documentation

## 2018-12-01 DIAGNOSIS — Z888 Allergy status to other drugs, medicaments and biological substances status: Secondary | ICD-10-CM | POA: Insufficient documentation

## 2018-12-01 DIAGNOSIS — F419 Anxiety disorder, unspecified: Secondary | ICD-10-CM | POA: Insufficient documentation

## 2018-12-01 DIAGNOSIS — G8929 Other chronic pain: Secondary | ICD-10-CM | POA: Insufficient documentation

## 2018-12-01 DIAGNOSIS — Z825 Family history of asthma and other chronic lower respiratory diseases: Secondary | ICD-10-CM | POA: Insufficient documentation

## 2018-12-01 DIAGNOSIS — Z803 Family history of malignant neoplasm of breast: Secondary | ICD-10-CM | POA: Insufficient documentation

## 2018-12-01 DIAGNOSIS — G43909 Migraine, unspecified, not intractable, without status migrainosus: Secondary | ICD-10-CM | POA: Insufficient documentation

## 2018-12-01 DIAGNOSIS — Z7982 Long term (current) use of aspirin: Secondary | ICD-10-CM | POA: Insufficient documentation

## 2018-12-01 DIAGNOSIS — Z881 Allergy status to other antibiotic agents status: Secondary | ICD-10-CM | POA: Insufficient documentation

## 2018-12-01 DIAGNOSIS — F329 Major depressive disorder, single episode, unspecified: Secondary | ICD-10-CM | POA: Insufficient documentation

## 2018-12-01 DIAGNOSIS — G459 Transient cerebral ischemic attack, unspecified: Principal | ICD-10-CM | POA: Insufficient documentation

## 2018-12-01 HISTORY — DX: Essential (primary) hypertension: I10

## 2018-12-01 LAB — CBC
HCT: 40.3 % (ref 36.0–46.0)
Hemoglobin: 13.1 g/dL (ref 12.0–15.0)
MCH: 31.8 pg (ref 26.0–34.0)
MCHC: 32.5 g/dL (ref 30.0–36.0)
MCV: 97.8 fL (ref 80.0–100.0)
Platelets: 312 10*3/uL (ref 150–400)
RBC: 4.12 MIL/uL (ref 3.87–5.11)
RDW: 13.7 % (ref 11.5–15.5)
WBC: 9.1 10*3/uL (ref 4.0–10.5)
nRBC: 0 % (ref 0.0–0.2)

## 2018-12-01 LAB — COMPREHENSIVE METABOLIC PANEL
ALT: 22 U/L (ref 0–44)
AST: 26 U/L (ref 15–41)
Albumin: 4.3 g/dL (ref 3.5–5.0)
Alkaline Phosphatase: 73 U/L (ref 38–126)
Anion gap: 9 (ref 5–15)
BUN: 9 mg/dL (ref 6–20)
CO2: 27 mmol/L (ref 22–32)
Calcium: 9.2 mg/dL (ref 8.9–10.3)
Chloride: 100 mmol/L (ref 98–111)
Creatinine, Ser: 0.69 mg/dL (ref 0.44–1.00)
GFR calc Af Amer: >60 mL/min (ref >60)
GFR calc non Af Amer: >60 mL/min (ref >60)
Glucose, Bld: 114 mg/dL — ABNORMAL HIGH (ref 70–99)
Potassium: 4.3 mmol/L (ref 3.5–5.1)
Sodium: 136 mmol/L (ref 135–145)
Total Bilirubin: 0.6 mg/dL (ref 0.3–1.2)
Total Protein: 7.1 g/dL (ref 6.5–8.1)

## 2018-12-01 LAB — DIFFERENTIAL
Abs Immature Granulocytes: 0.03 10*3/uL (ref 0.00–0.07)
Basophils Absolute: 0.1 10*3/uL (ref 0.0–0.1)
Basophils Relative: 1 %
Eosinophils Absolute: 0.3 10*3/uL (ref 0.0–0.5)
Eosinophils Relative: 3 %
Immature Granulocytes: 0 %
Lymphocytes Relative: 18 %
Lymphs Abs: 1.6 10*3/uL (ref 0.7–4.0)
Monocytes Absolute: 0.5 10*3/uL (ref 0.1–1.0)
Monocytes Relative: 5 %
Neutro Abs: 6.7 10*3/uL (ref 1.7–7.7)
Neutrophils Relative %: 73 %

## 2018-12-01 LAB — PROTIME-INR
INR: 1 (ref 0.8–1.2)
Prothrombin Time: 13.1 seconds (ref 11.4–15.2)

## 2018-12-01 LAB — APTT: aPTT: 35 seconds (ref 24–36)

## 2018-12-01 LAB — POC URINE PREG, ED: Preg Test, Ur: NEGATIVE

## 2018-12-01 MED ORDER — ASPIRIN 325 MG PO TABS: 325.0000 mg | ORAL_TABLET | Freq: Every day | ORAL | Status: DC

## 2018-12-01 MED ORDER — ASPIRIN 325 MG PO TABS
325.0000 mg | ORAL_TABLET | Freq: Every day | ORAL | Status: DC
  Administered 2018-12-02: 325 mg via ORAL
  Filled 2018-12-01: qty 1

## 2018-12-01 MED ORDER — VENLAFAXINE HCL 75 MG PO TABS
75.0000 mg | ORAL_TABLET | Freq: Two times a day (BID) | ORAL | Status: DC
  Administered 2018-12-01 – 2018-12-02 (×2): 75 mg via ORAL
  Filled 2018-12-01 (×2): qty 1
  Filled 2018-12-01: qty 2
  Filled 2018-12-01 (×4): qty 1

## 2018-12-01 MED ORDER — HYDROCODONE-ACETAMINOPHEN 5-325 MG PO TABS: 1.0000 | ORAL_TABLET | Freq: Four times a day (QID) | ORAL | Status: DC | PRN

## 2018-12-01 MED ORDER — ADULT MULTIVITAMIN W/MINERALS CH
1.0000 | ORAL_TABLET | Freq: Every day | ORAL | Status: DC
  Administered 2018-12-02: 1 via ORAL
  Filled 2018-12-01: qty 1

## 2018-12-01 MED ORDER — ACETAMINOPHEN 325 MG PO TABS: 650.0000 mg | ORAL_TABLET | ORAL | Status: DC | PRN

## 2018-12-01 MED ORDER — ACETAMINOPHEN 650 MG RE SUPP: 650.0000 mg | RECTAL | Status: DC | PRN

## 2018-12-01 MED ORDER — ACETAMINOPHEN 160 MG/5ML PO SOLN: 650.0000 mg | ORAL | Status: DC | PRN

## 2018-12-01 MED ORDER — ASPIRIN 81 MG PO CHEW
324.0000 mg | CHEWABLE_TABLET | Freq: Once | ORAL | Status: AC
  Administered 2018-12-01: 324 mg via ORAL
  Filled 2018-12-01: qty 4

## 2018-12-01 MED ORDER — VITAMIN D3 25 MCG (1000 UNIT) PO TABS
1000.0000 [IU] | ORAL_TABLET | Freq: Every day | ORAL | Status: DC
  Administered 2018-12-02: 1000 [IU] via ORAL
  Filled 2018-12-01 (×3): qty 1

## 2018-12-01 MED ORDER — ALPRAZOLAM 0.5 MG PO TABS
1.0000 mg | ORAL_TABLET | Freq: Two times a day (BID) | ORAL | Status: DC
  Administered 2018-12-01 – 2018-12-02 (×2): 1 mg via ORAL
  Filled 2018-12-01 (×2): qty 2

## 2018-12-01 MED ORDER — ASPIRIN 300 MG RE SUPP: 300.0000 mg | Freq: Every day | RECTAL | Status: DC

## 2018-12-01 MED ORDER — SENNOSIDES-DOCUSATE SODIUM 8.6-50 MG PO TABS
1.0000 | ORAL_TABLET | Freq: Every evening | ORAL | Status: DC | PRN
  Filled 2018-12-01: qty 1

## 2018-12-01 MED ORDER — OXYBUTYNIN CHLORIDE 5 MG PO TABS
5.0000 mg | ORAL_TABLET | Freq: Every day | ORAL | Status: DC
  Administered 2018-12-01: 5 mg via ORAL
  Filled 2018-12-01 (×4): qty 1

## 2018-12-01 MED ORDER — CALCIUM-VITAMIN D 500-200 MG-UNIT PO TABS
1.0000 | ORAL_TABLET | Freq: Every day | ORAL | Status: DC
  Administered 2018-12-01: 1 via ORAL
  Filled 2018-12-01 (×4): qty 1

## 2018-12-01 MED ORDER — ENOXAPARIN SODIUM 40 MG/0.4ML ~~LOC~~ SOLN
40.0000 mg | SUBCUTANEOUS | Status: DC
  Administered 2018-12-01: 40 mg via SUBCUTANEOUS
  Filled 2018-12-01: qty 0.4

## 2018-12-01 MED ORDER — TIZANIDINE HCL 2 MG PO TABS
2.0000 mg | ORAL_TABLET | Freq: Three times a day (TID) | ORAL | Status: DC | PRN
  Filled 2018-12-01: qty 1

## 2018-12-01 MED ORDER — NORTRIPTYLINE HCL 25 MG PO CAPS
50.0000 mg | ORAL_CAPSULE | Freq: Every day | ORAL | Status: DC
  Administered 2018-12-01: 50 mg via ORAL
  Filled 2018-12-01 (×4): qty 2

## 2018-12-01 MED ORDER — STROKE: EARLY STAGES OF RECOVERY BOOK
Freq: Once | Status: AC
  Administered 2018-12-02: 16:00:00
  Filled 2018-12-01 (×2): qty 1

## 2018-12-01 NOTE — ED Triage Notes (Signed)
Pt presents to ED with complaints of left sided facial numbness which started this morning. Pt states she woke up with confusion, lightheadedness this morning at 0730 and then facial numbness started at 12-1228. Pt went to bed last night at 2230 and denies symptoms.

## 2018-12-01 NOTE — ED Notes (Signed)
Tele neuro in process 

## 2018-12-01 NOTE — ED Provider Notes (Signed)
MSE was initiated and I personally evaluated the patient and placed orders (if any) at  1:59 PM on December 01, 2018.  I was called to assess patient for possible code stroke.  She states that she last was known normal at around 10:30 PM last night.  When she woke up this morning around 7:30 AM she did not feel right and felt confused and not quite right that was hard to describe.  Around an hour ago around 12:30 PM she noticed left facial tingling and numbness.  No facial droop, trouble speaking, blurry, double, or abnormal vision.  No weakness or numbness anywhere else.  She had numbness like this a week ago or so after some jaw pain.  CN 3-12 grossly intact, save for some subjective but present decrease in sensation to left face. 5/5 strength in all 4 extremities. Grossly normal sensation.   Will order CT head and stroke labs but at this point, her symptoms are mild and she was last technically normal last night so she is not a TPA candidate or an obvious LVO.  The patient appears stable so that the remainder of the MSE may be completed by another provider.   Sherwood Gambler, MD 12/01/18 [phone]

## 2018-12-01 NOTE — ED Notes (Signed)
Hospitalist at bedside 

## 2018-12-01 NOTE — ED Provider Notes (Addendum)
[facility] Provider Note   CSN: [id number] Arrival date & time: 12/01/18  1334     History   Chief Complaint Chief Complaint  Patient presents with  . Facial Numbness    HPI Felicia Greer is a 58 y.o. female.     HPI  This patient is a 58 year old female, she has a history of hypertension for which she takes amlodipine.  She has had 2 very distinct episodes over the last week where she feels like she has left-sided facial numbness and tingling, something does not feel right.  There is a possibility that there is some blurred vision when it occurs as well.  The first time it happened it was short-lived and went away.  She took some baby aspirin, checked her pressure and seem to be very high like 189 systolic.  Today she had another episode where this again occurred, and has been ongoing since she woke up this morning, gradually improving but still present on the left side of the face.  She denies any weakness or numbness of the arms or the legs, no changes in speech or vision, no changes in coordination.  She reports that she has had a mild tremor over the last month and does not know where that is come from.  She drinks 1 or 2 shots of liquor at night before bed but has not had any increase in her alcohol intake.  She does smoke cigarettes.  She has not been evaluated for this complaint prior to her evaluation today.  Past Medical History:  Diagnosis Date  . Acid reflux   . Arthritis    pelvic bone  . Back pain   . BV (bacterial vaginosis) 4 18 2012  . COPD (chronic obstructive pulmonary disease) (HCC) 2010  . Dyspareunia, female 01/05/2015  . Hx of goiter   . Hx: UTI (urinary tract infection)   . Hypertension   . Osteopenia determined by x-ray 03/10/2014  . Pelvic pain in female 03/01/2014  . Rhinitis 02/15/2014  . Urinary frequency 01/05/2015  . Vaginal odor 01/05/2015    Patient Active Problem List   Diagnosis Date Noted  . Screening for colorectal  cancer 10/27/2017  . Encounter for gynecological examination with Papanicolaou smear of cervix 10/27/2017  . Constipation 09/08/2017  . Rectocele 09/08/2017  . Encounter for well woman exam with routine gynecological exam 10/03/2016  . Vaginal atrophy 10/03/2016  . Vaginal odor 01/05/2015  . Urinary frequency 01/05/2015  . Nocturia more than twice per night 01/05/2015  . Dyspareunia, female 01/05/2015  . Osteopenia determined by x-ray 03/10/2014  . Pelvic pain in female 03/01/2014  . Back pain 03/01/2014  . Depression 02/20/2014  . Rhinitis 02/15/2014  . Insomnia 09/19/2013  . COPD exacerbation (HCC) 04/23/2012  . Chronic anxiety 04/23/2012  . Vaginal discharge 04/07/2012  . Acid reflux 04/04/2012  . Hx: UTI (urinary tract infection) 04/04/2012  . Thyroid goiter 04/04/2012  . BV (bacterial vaginosis) 04/04/2012    Past Surgical History:  Procedure Laterality Date  . BIOPSY THYROID N/A 06/2009   had fna  . BREAST BIOPSY Right in the 1990s  . BREAST BIOPSY  1980   benign  . CERVICAL FUSION N/A in the 1990s  . HYSTEROSCOPY W/ ENDOMETRIAL ABLATION N/A 2004  . TUBAL LIGATION       OB History    Gravida  3   Para  3   Term  3   Preterm      AB  Living  3     SAB      TAB      Ectopic      Multiple      Live Births  3            Home Medications    Prior to Admission medications   Medication Sig Start Date End Date Taking? Authorizing Provider  ALPRAZolam Prudy Feeler) 1 MG tablet Take 1 tablet by mouth 2 (two) times daily.  11/19/14   [provider]  amLODipine (NORVASC) 5 MG tablet Take 5 mg by mouth daily.  09/08/17   [provider]  Calcium Carbonate-Vit D-Min (CALCIUM 1200 PO) Take by mouth daily.    [provider]  Cholecalciferol (VITAMIN D3) 5000 UNITS CAPS Take 1 capsule by mouth daily.    [provider]  HYDROcodone-acetaminophen (NORCO/VICODIN) 5-325 MG tablet Take 1 tablet by mouth 4 (four) times daily  as needed. 11/24/14   [provider]  meloxicam (MOBIC) 7.5 MG tablet Take 1 tablet by mouth 2 (two) times daily as needed.  11/19/14   [provider]  Multiple Vitamins-Minerals (MULTIVITAMIN PO) Take 1 tablet by mouth daily.    [provider]  nortriptyline (PAMELOR) 25 MG capsule Take 1 capsule (25 mg total) by mouth at bedtime. 05/15/15   Drema Dallas, DO  phenazopyridine (PYRIDIUM) 200 MG tablet Take 1 tablet (200 mg total) by mouth 3 (three) times daily as needed for pain. 01/30/18   Adline Potter, NP  promethazine (PHENERGAN) 12.5 MG tablet Take 1 tablet (12.5 mg total) by mouth every 6 (six) hours as needed for nausea or vomiting. 05/15/15   Everlena Cooper, Adam R, DO  risedronate (ACTONEL) 150 MG tablet take 1 tablet by mouth every month WITH WATER ON EMPTY STOMACH DO NOT LIE DOWN FOR 30 MINUTES 01/30/18   Adline Potter, NP  sulfamethoxazole-trimethoprim (BACTRIM DS,SEPTRA DS) 800-160 MG tablet Take 1 tablet by mouth 2 (two) times daily. Take 1 bid 01/30/18   Cyril Mourning A, NP  tiZANidine (ZANAFLEX) 2 MG tablet Take 1 tablet (2 mg total) by mouth every 8 (eight) hours as needed for muscle spasms. 01/10/15   Drema Dallas, DO    Family History Family History  Problem Relation Age of Onset  . Cancer Maternal Aunt        cervical   . Breast cancer Maternal Grandmother   . Breast cancer Other   . Fibromyalgia Mother   . Other Mother        sciatic nerve; bad shoulder  . Colon cancer Mother   . Other Father        collapsed lung  . COPD Father   . Heart attack Father   . Other Sister        valves replaced in heart  . Heart attack Sister   . Anxiety disorder Brother   . Other Brother        takes med for heart  . Anxiety disorder Daughter   . Emphysema Maternal Grandfather   . Heart attack Paternal Grandmother   . Breast cancer Maternal Aunt   . Cancer Other        breast    Social History Social History   Tobacco Use  . Smoking  status: Current Every Day Smoker    Packs/day: 0.00    Years: 38.00    Pack years: 0.00    Types: E-cigarettes    Start date: 10/25/1972  Last attempt to quit: 12/21/2010    Years since quitting: 7.9  . Smokeless tobacco: Never Used  . Tobacco comment: use electronic cigs  Substance Use Topics  . Alcohol use: No    Alcohol/week: 0.0 standard drinks  . Drug use: No     Allergies   Codeine, Metrogel [metronidazole], and Protonix [pantoprazole sodium]   Review of Systems Review of Systems  All other systems reviewed and are negative.    Physical Exam Updated Vital Signs BP (!) 168/79 (BP Location: Right Arm)   Pulse 73   Temp 98 F (36.7 C) (Oral)   Resp 16   Ht 1.626 m (5\' 4" )   Wt 49.9 kg   SpO2 98%   BMI 18.88 kg/m   Physical Exam Vitals signs and nursing note reviewed.  Constitutional:      General: She is not in acute distress.    Appearance: She is well-developed.  HENT:     Head: Normocephalic and atraumatic.     Mouth/Throat:     Pharynx: No oropharyngeal exudate.  Eyes:     General: No scleral icterus.       Right eye: No discharge.        Left eye: No discharge.     Conjunctiva/sclera: Conjunctivae normal.     Pupils: Pupils are equal, round, and reactive to light.  Neck:     Musculoskeletal: Normal range of motion and neck supple.     Thyroid: No thyromegaly.     Vascular: No JVD.  Cardiovascular:     Rate and Rhythm: Normal rate and regular rhythm.     Heart sounds: Normal heart sounds. No murmur. No friction rub. No gallop.   Pulmonary:     Effort: Pulmonary effort is normal. No respiratory distress.     Breath sounds: Normal breath sounds. No wheezing or rales.  Abdominal:     General: Bowel sounds are normal. There is no distension.     Palpations: Abdomen is soft. There is no mass.     Tenderness: There is no abdominal tenderness.  Musculoskeletal: Normal range of motion.        General: No tenderness.  Lymphadenopathy:      Cervical: No cervical adenopathy.  Skin:    General: Skin is warm and dry.     Findings: No erythema or rash.  Neurological:     Mental Status: She is alert.     Coordination: Coordination normal.     Comments: Mild tremor, left side of the face feels normal compared to the right side  Speech is clear, cranial nerves III through XII are intact, memory is intact, strength is normal in all 4 extremities including grips, sensation is intact to light touch and pinprick in all 4 extremities. Coordination as tested by finger-nose-finger is normal, no limb ataxia. Normal gait, normal reflexes at the patellar tendons bilaterally  Psychiatric:        Behavior: Behavior normal.      ED Treatments / Results  Labs (all labs ordered are listed, but only abnormal results are displayed) Labs Reviewed  COMPREHENSIVE METABOLIC PANEL - Abnormal; Notable for the following components:      Result Value   Glucose, Bld 114 (*)    All other components within normal limits  PROTIME-INR  APTT  CBC  DIFFERENTIAL  CBG MONITORING, ED  POC URINE PREG, ED    EKG EKG Interpretation  Date/Time:  Tuesday December 01 2018 14:01:12 EST Ventricular Rate:  81 PR  Interval:  148 QRS Duration: 90 QT Interval:  430 QTC Calculation: 499 R Axis:   104 Text Interpretation: Normal sinus rhythm Biatrial enlargement Rightward axis Pulmonary disease pattern RSR' or QR pattern in V1 suggests right ventricular conduction delay Prolonged QT Abnormal ECG similar to 2013 Confirmed by Pricilla Loveless [phone]) on 12/01/2018 2:19:27 PM   Radiology Ct Head Wo Contrast  Result Date: 12/01/2018 CLINICAL DATA:  Headache left-sided facial numbness and blurry vision EXAM: CT HEAD WITHOUT CONTRAST TECHNIQUE: Contiguous axial images were obtained from the base of the skull through the vertex without intravenous contrast. COMPARISON:  None. FINDINGS: Brain: No evidence of acute infarction, hemorrhage, hydrocephalus, extra-axial  collection or mass lesion/mass effect. Vascular: No hyperdense vessel or unexpected calcification. Skull: Normal. Negative for fracture or focal lesion. Sinuses/Orbits: No acute finding. Other: None IMPRESSION: Negative non contrasted CT appearance of the brain Electronically Signed   By: Jasmine Pang M.D.   On: 12/01/2018 15:59    Procedures Procedures (including critical care time)  Medications Ordered in ED Medications  aspirin chewable tablet 324 mg (has no administration in time range)     Initial Impression / Assessment and Plan / ED Course  I have reviewed the triage vital signs and the nursing notes.  Pertinent labs & imaging results that were available during my care of the patient were reviewed by me and considered in my medical decision making (see chart for details).       CT scan of the brain is negative, labs are unremarkable, will consult with neurology, possible TIA, the patient does have a history of migraines but does not have a headache at this time and is not had migraines in a while.  Discussed with neurologist who agrees with admitting the patient to the hospital for possible transient ischemic attack, but also consider complicated migraine however the patient is acephalic and not having any headaches at this time.  Her white blood cell count is normal, labs are unremarkable, CT scan is negative, the patient is agreeable to staying.  I discussed the care with the hospitalist who is agreeable to admit  Felicia Greer was evaluated in Emergency Department on 12/01/2018 for the symptoms described in the history of present illness. She was evaluated in the context of the global COVID-19 pandemic, which necessitated consideration that the patient might be at risk for infection with the SARS-CoV-2 virus that causes COVID-19. Institutional protocols and algorithms that pertain to the evaluation of patients at risk for COVID-19 are in a state of rapid change based on information  released by regulatory bodies including the CDC and federal and state organizations. These policies and algorithms were followed during the patient's care in the ED.   Final Clinical Impressions(s) / ED Diagnoses   Final diagnoses:  TIA (transient ischemic attack)    ED Discharge Orders    None       Eber Hong, MD 12/01/18 Valrie Hart    Eber Hong, MD 12/01/18 2006

## 2018-12-01 NOTE — ED Notes (Signed)
Called for med verification

## 2018-12-01 NOTE — Consult Note (Signed)
[facility] Tele[facility]urology Consult Services  Stat Consult  Date of Service:   12/01/2018 18:59:37  Impression:     .  Transient Ischemic Attack     .  Paresthesias  Comments/Sign-Out: Patient is a 58 years old woman who presents to the ED c/o intermittent paresthesias involving the left side of her face. NIHSS is 0. Non contrast CTH showed no acute abnormalities. Patient has history of migraines but has never had focal deficits. Small vessel stroke on the DDx however, atypical in the absence of any other symptoms. Recommend further evaluation with MRI brain.  CT HEAD: Showed No Acute Hemorrhage or Acute Core Infarct  Metrics: [facility] Notification Time: 12/01/2018 18:57:43 Stamp Time: 12/01/2018 18:59:37 Callback Response Time: 12/01/2018 18:59:57 Video Start Time: 12/01/2018 19:12:47 Video End Time: 12/01/2018 19:25:58  Our recommendations are outlined below.  Recommendations:     .  Initiate Aspirin 325 MG Daily  Imaging Studies:     .  MRI Head Without Contrast  Other WorkUp:     .  Check CMP  Disposition: Neurology Follow Up Recommended  Sign Out:     .  Discussed with Emergency Department Provider  ----------------------------------------------------------------------------------------------------  Chief Complaint: Numbness  History of Present Illness: Patient is a 58 year old Female.  Patient is a 58 years old woman with history of HTN and migraines who presents to the ED c/o numbness of the left side of her face. Patient reports she first had the symptoms about one week ago but that it went away. Left facial numbness recurred at around noon today and has persisted since then. She has no other associated symptoms as numbness of the extremities, visual changes, weakness, or headache. She has no prior history of stroke.   Past Medical History:     . There is NO history of Hypertension  Anticoagulant use:  No  Antiplatelet use:  No Examination: BP(168/79), Pulse(73), Blood Glucose(114) 1A: Level of Consciousness - Alert; keenly responsive + 0 1B: Ask Month and Age - Both Questions Right + 0 1C: Blink Eyes & Squeeze Hands - Performs Both Tasks + 0 2: Test Horizontal Extraocular Movements - Normal + 0 3: Test Visual Fields - No Visual Loss + 0 4: Test Facial Palsy (Use Grimace if Obtunded) - Normal symmetry + 0 5A: Test Left Arm Motor Drift - No Drift for 10 Seconds + 0 5B: Test Right Arm Motor Drift - No Drift for 10 Seconds + 0 6A: Test Left Leg Motor Drift - No Drift for 5 Seconds + 0 6B: Test Right Leg Motor Drift - No Drift for 5 Seconds + 0 7: Test Limb Ataxia (FNF/Heel-Shin) - No Ataxia + 0 8: Test Sensation - Normal; No sensory loss + 0 9: Test Language/Aphasia - Normal; No aphasia + 0 10: Test Dysarthria - Normal + 0 11: Test Extinction/Inattention - No abnormality + 0  NIHSS Score: 0  Patient/Family was informed the Neurology Consult would happen via TeleHealth consult by way of interactive audio and video telecommunications and consented to receiving care in this manner.  Due to the immediate potential for life-threatening deterioration due to underlying acute neurologic illness, I spent 35 minutes providing critical care. This time includes time for face to face visit via telemedicine, review of medical records, imaging studies and discussion of findings with providers, the patient and/or family.   Dr Harriet Masson   [facility] [phone]   Case [id number]

## 2018-12-01 NOTE — H&P (Signed)
History and Physical    Felicia PontSherry Retherford WJX:[id number] DOB: 04/24/1960 DOA: 12/01/2018  PCP: Elfredia NevinsFusco, Lawrence, MD  Patient coming from: Home  I have personally briefly reviewed patient's [facility]  Chief Complaint: Left facial numbness  HPI: Felicia Greer is a 58 y.o. female with medical history significant of anxiety, migraine headaches, osteoarthritis, osteoporosis who presented to the ER with left facial numbness.  Patient was last known well at 10:30 PM on 11/30/2018 when she went to sleep.  She got up this morning on 12/01/2018 at 7:30 AM with mild lightheadedness, feeling of mild confusion.  Subsequently around 12 PM she reported having left facial tingling and numbness. Patient did have similar symptoms of left facial numbness around 1 week ago but says that the symptoms resolved without intervention.  Today she continued to have tingling and numbness over the left side of the face which persisted and she decided to come into the ER. No facial deviation noted, no significant blurring of vision, no focal weakness, headache, syncope, seizures, falls, head injury, No nausea, vomiting, abdominal pain, diarrhea, fever, chills, cough, shortness of breath, palpitations, skin rash, dysuria. ED Course:  Vital Signs reviewed on presentation, significant for temperature 98, blood pressure 168/79, pulse of 73, saturation 98% on room air, respiratory 16. Labs reviewed, significant for sodium 136, potassium 4.3, chloride 100, BUN 9, creatinine 0.69, LFTs within normal limits, WBC 9.1, hemoglobin 13.1, hematocrit 40.3, platelets 312, PT/INR and PTT are normal. Imaging personally Reviewed, CT of the head shows no acute intracranial abnormalities. EKG personally reviewed, shows sinus rhythm, borderline right axis deviation. Patient was seen by tele-neurology on-call in the ER who opined that patient probably had atypical symptoms however would recommend admission for MRI and further  work-up.  Review of Systems: As per HPI otherwise 10 point review of systems negative.  All other review of systems is negative except the ones noted above in the HPI.  Past Medical History:  Diagnosis Date  . Acid reflux   . Arthritis    pelvic bone  . Back pain   . BV (bacterial vaginosis) 4 18 2012  . COPD (chronic obstructive pulmonary disease) (HCC) 2010  . Dyspareunia, female 01/05/2015  . Hx of goiter   . Hx: UTI (urinary tract infection)   . Hypertension   . Osteopenia determined by x-ray 03/10/2014  . Pelvic pain in female 03/01/2014  . Rhinitis 02/15/2014  . Urinary frequency 01/05/2015  . Vaginal odor 01/05/2015    Past Surgical History:  Procedure Laterality Date  . BIOPSY THYROID N/A 06/2009   had fna  . BREAST BIOPSY Right in the 1990s  . BREAST BIOPSY  1980   benign  . CERVICAL FUSION N/A in the 1990s  . HYSTEROSCOPY W/ ENDOMETRIAL ABLATION N/A 2004  . TUBAL LIGATION       reports that she has been smoking e-cigarettes. She started smoking about 46 years ago. She has been smoking about 0.00 packs per day for the past 38.00 years. She has never used smokeless tobacco. She reports that she does not drink alcohol or use drugs.  Allergies  Allergen Reactions  . Codeine Nausea Only  . Metrogel [Metronidazole]     Had local reaction  . Protonix [Pantoprazole Sodium] Rash    Family History  Problem Relation Age of Onset  . Cancer Maternal Aunt        cervical   . Breast cancer Maternal Grandmother   . Breast cancer Other   .  Fibromyalgia Mother   . Other Mother        sciatic nerve; bad shoulder  . Colon cancer Mother   . Other Father        collapsed lung  . COPD Father   . Heart attack Father   . Other Sister        valves replaced in heart  . Heart attack Sister   . Anxiety disorder Brother   . Other Brother        takes med for heart  . Anxiety disorder Daughter   . Emphysema Maternal Grandfather   . Heart attack Paternal Grandmother   .  Breast cancer Maternal Aunt   . Cancer Other        breast   Family history reviewed, noted as above, not pertinent to current presentation.   Prior to Admission medications   Medication Sig Start Date End Date Taking? Authorizing Provider  ALPRAZolam Prudy Feeler) 1 MG tablet Take 1 tablet by mouth 2 (two) times daily.  11/19/14   [provider]  amLODipine (NORVASC) 5 MG tablet Take 5 mg by mouth daily.  09/08/17   [provider]  Calcium Carbonate-Vit D-Min (CALCIUM 1200 PO) Take by mouth daily.    [provider]  Cholecalciferol (VITAMIN D3) 5000 UNITS CAPS Take 1 capsule by mouth daily.    [provider]  HYDROcodone-acetaminophen (NORCO/VICODIN) 5-325 MG tablet Take 1 tablet by mouth 4 (four) times daily as needed. 11/24/14   [provider]  meloxicam (MOBIC) 7.5 MG tablet Take 1 tablet by mouth 2 (two) times daily as needed.  11/19/14   [provider]  Multiple Vitamins-Minerals (MULTIVITAMIN PO) Take 1 tablet by mouth daily.    [provider]  nortriptyline (PAMELOR) 25 MG capsule Take 1 capsule (25 mg total) by mouth at bedtime. 05/15/15   Drema Dallas, DO  phenazopyridine (PYRIDIUM) 200 MG tablet Take 1 tablet (200 mg total) by mouth 3 (three) times daily as needed for pain. 01/30/18   Adline Potter, NP  promethazine (PHENERGAN) 12.5 MG tablet Take 1 tablet (12.5 mg total) by mouth every 6 (six) hours as needed for nausea or vomiting. 05/15/15   Everlena Cooper, Adam R, DO  risedronate (ACTONEL) 150 MG tablet take 1 tablet by mouth every month WITH WATER ON EMPTY STOMACH DO NOT LIE DOWN FOR 30 MINUTES 01/30/18   Adline Potter, NP  sulfamethoxazole-trimethoprim (BACTRIM DS,SEPTRA DS) 800-160 MG tablet Take 1 tablet by mouth 2 (two) times daily. Take 1 bid 01/30/18   Cyril Mourning A, NP  tiZANidine (ZANAFLEX) 2 MG tablet Take 1 tablet (2 mg total) by mouth every 8 (eight) hours as needed for muscle spasms. 01/10/15   Drema Dallas, DO    Physical Exam: Vitals:   12/01/18 1347 12/01/18 1352  BP: (!) 168/79   Pulse: 73   Resp: 16   Temp: 98 F (36.7 C)   TempSrc: Oral   SpO2: 98%   Weight:  49.9 kg  Height:   (1.626 m)    Constitutional: NAD, calm, comfortable Vitals:   12/01/18 1347 12/01/18 1352  BP: (!) 168/79   Pulse: 73   Resp: 16   Temp: 98 F (36.7 C)   TempSrc: Oral   SpO2: 98%   Weight:  49.9 kg  Height:   (1.626 m)   Eyes: PERRL, lids and conjunctivae normal ENMT: Mucous membranes are moist. Posterior pharynx clear of any exudate or lesions.Normal  dentition.  Neck: normal, supple, no masses, no thyromegaly Respiratory: clear to auscultation bilaterally, no wheezing, no crackles. Normal respiratory effort. No accessory muscle use.  Cardiovascular: Regular rate and rhythm, no murmurs / rubs / gallops. No extremity edema. 2+ pedal pulses. No carotid bruits.  Abdomen: no tenderness, no masses palpated. No hepatosplenomegaly. Bowel sounds positive.  Musculoskeletal: no clubbing / cyanosis. No joint deformity upper and lower extremities. Good ROM, no contractures. Normal muscle tone.  Skin: no rashes, lesions, ulcers. No induration Neurologic: CN 2-12 grossly intact. Sensation intact, DTR normal. Strength 5/5 in all 4.  Psychiatric: Normal judgment and insight. Alert and oriented x 3. Normal mood.    Decubitus Ulcers: Not present on admission Catheters and tubes: None   Labs on Admission: I have personally reviewed following labs and imaging studies  CBC: Recent Labs  Lab 12/01/18 1416  WBC 9.1  NEUTROABS 6.7  HGB 13.1  HCT 40.3  MCV 97.8  PLT 865   Basic Metabolic Panel: Recent Labs  Lab 12/01/18 1416  NA 136  K 4.3  CL 100  CO2 27  GLUCOSE 114*  BUN 9  CREATININE 0.69  CALCIUM 9.2   GFR: Estimated Creatinine Clearance: 59.6 mL/min (by C-G formula based on SCr of 0.69 mg/dL). Liver Function Tests: Recent Labs  Lab 12/01/18 1416  AST 26  ALT 22   ALKPHOS 73  BILITOT 0.6  PROT 7.1  ALBUMIN 4.3   No results for input(s): LIPASE, AMYLASE in the last 168 hours. No results for input(s): AMMONIA in the last 168 hours. Coagulation Profile: Recent Labs  Lab 12/01/18 1416  INR 1.0   Cardiac Enzymes: No results for input(s): CKTOTAL, CKMB, CKMBINDEX, TROPONINI in the last 168 hours. BNP (last 3 results) No results for input(s): PROBNP in the last 8760 hours. HbA1C: No results for input(s): HGBA1C in the last 72 hours. CBG: No results for input(s): GLUCAP in the last 168 hours. Lipid Profile: No results for input(s): CHOL, HDL, LDLCALC, TRIG, CHOLHDL, LDLDIRECT in the last 72 hours. Thyroid Function Tests: No results for input(s): TSH, T4TOTAL, FREET4, T3FREE, THYROIDAB in the last 72 hours. Anemia Panel: No results for input(s): VITAMINB12, FOLATE, FERRITIN, TIBC, IRON, RETICCTPCT in the last 72 hours. Urine analysis:    Component Value Date/Time   COLORURINE STRAW (A) 10/07/2011 0259   APPEARANCEUR Cloudy (A) 01/30/2018 1430   LABSPEC 1.007 10/07/2011 0259   PHURINE 7.0 10/07/2011 0259   GLUCOSEU Negative 01/30/2018 1430   HGBUR NEGATIVE 10/07/2011 0259   BILIRUBINUR Negative 01/30/2018 1430   KETONESUR NEGATIVE 10/07/2011 0259   PROTEINUR Negative 01/30/2018 1430   PROTEINUR NEGATIVE 10/07/2011 0259   UROBILINOGEN 0.2 10/07/2011 0259   NITRITE Positive (A) 01/30/2018 1430   NITRITE NEGATIVE 10/07/2011 0259   LEUKOCYTESUR 3+ (A) 01/30/2018 1430    Radiological Exams on Admission: Ct Head Wo Contrast  Result Date: 12/01/2018 CLINICAL DATA:  Headache left-sided facial numbness and blurry vision EXAM: CT HEAD WITHOUT CONTRAST TECHNIQUE: Contiguous axial images were obtained from the base of the skull through the vertex without intravenous contrast. COMPARISON:  None. FINDINGS: Brain: No evidence of acute infarction, hemorrhage, hydrocephalus, extra-axial collection or mass lesion/mass effect. Vascular: No hyperdense  vessel or unexpected calcification. Skull: Normal. Negative for fracture or focal lesion. Sinuses/Orbits: No acute finding. Other: None IMPRESSION: Negative non contrasted CT appearance of the brain Electronically Signed   By: Donavan Foil M.D.   On: 12/01/2018 15:59      Assessment/Plan Active Problems:  Thyroid goiter   Chronic anxiety   Depression   Facial numbness     Principal Problem: Left facial numbness Patient presented with left facial numbness, still persistent.  No headache.  No other focal deficits.  Cranial nerves otherwise intact. 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  Did have an MRI of the brain in 2017 and was seen by neurology as an outpatient.  Report was noted to have chronic white matter changes with uncertain clinical significance.  Current presentation does not seem consistent with migraines.  May have had a possible TIA.  No facial pain or suggestion of trigeminal neuralgia.  No motor loss to suggest Bell's palsy. Plan: Telemetry monitoring MRI brain with and without contrast Carotid Doppler, echocardiogram We will start an aspirin Neurochecks every 4 hours  Other Active Problems: Hypertension: Hold amlodipine for tonight to allow for permissive hypertension  Osteoporosis: Continue cholecalciferol  Osteoarthritis/chronic low back pain: Has history of cervical spinal fusion surgery in the past.  Has residual right thumb numbness following surgery. Continue tizanidine, pain meds as needed  Depression/anxiety disorder: Continue Xanax  History of migraine headaches: No headache currently. Was seen by neurology as outpatient in 2017.  Noted to have brain MRI with chronic white matter changes as well as cervical spinal MRI with chronic mild degenerative changes with osteophytes at C6-C7 with no cord compression. Continue nortriptyline  DVT prophylaxis: Lovenox Code Status:  Full code Family Communication: N/A  Disposition  Plan: Place in observation, plan for TIA work-up with MRI, echo, carotid Doppler in a.m. can discharge if work-up is negative Consults called: N/A Admission status: Observation status   Olga Coaster MD [facility]  If 7PM-7AM, please contact night-coverage   12/01/2018, 7:52 PM

## 2018-12-01 NOTE — ED Notes (Signed)
ED Provider at bedside. 

## 2018-12-02 ENCOUNTER — Observation Stay (HOSPITAL_COMMUNITY): Payer: Self-pay

## 2018-12-02 ENCOUNTER — Observation Stay (HOSPITAL_BASED_OUTPATIENT_CLINIC_OR_DEPARTMENT_OTHER): Payer: Self-pay

## 2018-12-02 DIAGNOSIS — E049 Nontoxic goiter, unspecified: Secondary | ICD-10-CM

## 2018-12-02 DIAGNOSIS — F419 Anxiety disorder, unspecified: Secondary | ICD-10-CM

## 2018-12-02 DIAGNOSIS — R200 Anesthesia of skin: Secondary | ICD-10-CM

## 2018-12-02 DIAGNOSIS — G459 Transient cerebral ischemic attack, unspecified: Secondary | ICD-10-CM

## 2018-12-02 LAB — LIPID PANEL
Cholesterol: 216 mg/dL — ABNORMAL HIGH (ref 0–200)
HDL: 104 mg/dL (ref >40)
LDL Cholesterol: 99 mg/dL (ref 0–99)
Total CHOL/HDL Ratio: 2.1 RATIO
Triglycerides: 63 mg/dL (ref <150)
VLDL: 13 mg/dL (ref 0–40)

## 2018-12-02 LAB — ECHOCARDIOGRAM COMPLETE
Height: 64 in
Weight: 1760 oz

## 2018-12-02 LAB — SARS CORONAVIRUS 2 (TAT 6-24 HRS): SARS Coronavirus 2: NEGATIVE

## 2018-12-02 LAB — HIV ANTIBODY (ROUTINE TESTING W REFLEX): HIV Screen 4th Generation wRfx: NONREACTIVE

## 2018-12-02 LAB — HEMOGLOBIN A1C
Hgb A1c MFr Bld: 5.2 % (ref 4.8–5.6)
Mean Plasma Glucose: 102.54 mg/dL

## 2018-12-02 MED ORDER — CALCIUM 1200 1200-1000 MG-UNIT PO CHEW: 1.0000 | CHEWABLE_TABLET | Freq: Every day | ORAL | Status: DC

## 2018-12-02 MED ORDER — GADOBUTROL 1 MMOL/ML IV SOLN
5.0000 mL | Freq: Once | INTRAVENOUS | Status: AC | PRN
  Administered 2018-12-02: 5 mL via INTRAVENOUS

## 2018-12-02 MED ORDER — ASPIRIN 325 MG PO TABS: 325.0000 mg | ORAL_TABLET | Freq: Every day | ORAL | Status: DC

## 2018-12-02 NOTE — ED Notes (Signed)
Informed that half of am meds not in from pharmacy and note as sent.

## 2018-12-02 NOTE — Discharge Instructions (Signed)
Please follow up with neurologist in 1-2 weeks to discuss symptoms and follow up results.    Please take a daily aspirin 325 mg   Transient Ischemic Attack  A transient ischemic attack (TIA) is a "warning stroke" that causes stroke-like symptoms that go away quickly. A TIA does not cause lasting damage to the brain. But having a TIA is a sign that you may be at risk for a stroke. Lifestyle changes and medical treatments can help prevent a stroke. It is important to know the symptoms of a TIA and what to do. Get help right away, even if your symptoms go away. The symptoms of a TIA are the same as those of a stroke. They can happen fast, and they usually go away within minutes or hours. They can include:  Weakness or loss of feeling in your face, arm, or leg. This often happens on one side of your body.  Trouble walking.  Trouble moving your arms or legs.  Trouble talking or understanding what people are saying.  Trouble seeing.  Seeing two of one object (double vision).  Feeling dizzy.  Feeling confused.  Loss of balance or coordination.  Feeling sick to your stomach (nauseous) and throwing up (vomiting).  A very bad headache for no reason. What increases the risk? Certain things may make you more likely to have a TIA. Some of these are things that you can change, such as:  Being very overweight (obese).  Using products that contain nicotine or tobacco, such as cigarettes and e-cigarettes.  Taking birth control pills.  Not being active.  Drinking too much alcohol.  Using drugs. Other risk factors include:  Having an irregular heartbeat (atrial fibrillation).  Being African American or Hispanic.  Having had blood clots, stroke, TIA, or heart attack in the past.  Being a woman with a history of high blood pressure in pregnancy (preeclampsia).  Being over the age of 67.  Being female.  Having family history of stroke.  Having the following diseases or  conditions: ? High blood pressure. ? High cholesterol. ? Diabetes. ? Heart disease. ? Sickle cell disease. ? Sleep apnea. ? Migraine headache. ? Long-term (chronic) diseases that cause soreness and swelling (inflammation). ? Disorders that affect how your blood clots. Follow these instructions at home: Medicines   Take over-the-counter and prescription medicines only as told by your doctor.  If you were told to take aspirin or another medicine to thin your blood, take it exactly as told by your doctor. ? Taking too much of the medicine can cause bleeding. ? Taking too little of the medicine may not work to treat the problem. Eating and drinking   Eat 5 or more servings of fruits and vegetables each day.  Follow instructions from your doctor about your diet. You may need to follow a certain diet to help lower your risk of having a stroke. You may need to: ? Eat a diet that is low in fat and salt. ? Eat foods that contain a lot of fiber. ? Limit the amount of carbohydrates and sugar in your diet.  Limit alcohol intake to 1 drink a day for nonpregnant women and 2 drinks a day for men. One drink equals 12 oz of beer, 5 oz of wine, or 1 oz of hard liquor. General instructions  Keep a healthy weight.  Stay active. Try to get at least 30 minutes of activity on all or most days.  Find out if you have a condition called  sleep apnea. Get treatment if needed.  Do not use any products that contain nicotine or tobacco, such as cigarettes and e-cigarettes. If you need help quitting, ask your doctor.  Do not abuse drugs.  Keep all follow-up visits as told by your doctor. This is important. Get help right away if:  You have any signs of stroke. "BE FAST" is an easy way to remember the main warning signs: ? B - Balance. Signs are dizziness, sudden trouble walking, or loss of balance. ? E - Eyes. Signs are trouble seeing or a sudden change in how you see. ? F - Face. Signs are sudden  weakness or loss of feeling of the face, or the face or eyelid drooping on one side. ? A - Arms. Signs are weakness or loss of feeling in an arm. This happens suddenly and usually on one side of the body. ? S - Speech. Signs are sudden trouble speaking, slurred speech, or trouble understanding what people say. ? T - Time. Time to call emergency services. Write down what time symptoms started.  You have other signs of stroke, such as: ? A sudden, very bad headache with no known cause. ? Feeling sick to your stomach (nausea). ? Throwing up (vomiting). ? Jerky movements that you cannot control (seizure). These symptoms may be an emergency. Do not wait to see if the symptoms will go away. Get medical help right away. Call your local emergency services (911 in the [place] Do not drive yourself to the hospital. Summary  A transient ischemic attack (TIA) is a "warning stroke" that causes stroke-like symptoms that go away quickly.  A TIA is a medical emergency. Get help right away, even if your symptoms go away.  A TIA does not cause lasting damage to the brain.  Having a TIA is a sign that you may be at risk for a stroke. Lifestyle changes and medical treatments can help prevent a stroke. This information is not intended to replace advice given to you by your health care provider. Make sure you discuss any questions you have with your health care provider. Document Released: 10/17/2007 Document Revised: 10/03/2017 Document Reviewed: 04/10/2016 Elsevier Patient Education  2020 [employer]   IMPORTANT INFORMATION: PAY CLOSE ATTENTION   PHYSICIAN DISCHARGE INSTRUCTIONS  Follow with Primary care provider  Elfredia NevinsFusco, Lawrence, MD  and other consultants as instructed by your Hospitalist Physician  SEEK MEDICAL CARE OR RETURN TO EMERGENCY ROOM IF SYMPTOMS COME BACK, WORSEN OR NEW PROBLEM DEVELOPS   Please note: You were cared for by a hospitalist during your hospital stay. Every effort will be made  to forward records to your primary care provider.  You can request that your primary care provider send for your hospital records if they have not received them.  Once you are discharged, your primary care physician will handle any further medical issues. Please note that NO REFILLS for any discharge medications will be authorized once you are discharged, as it is imperative that you return to your primary care physician (or establish a relationship with a primary care physician if you do not have one) for your post hospital discharge needs so that they can reassess your need for medications and monitor your lab values.  Please get a complete blood count and chemistry panel checked by your Primary MD at your next visit, and again as instructed by your Primary MD.  Get Medicines reviewed and adjusted: Please take all your medications with you for your next visit with  your Primary MD  Laboratory/radiological data: Please request your Primary MD to go over all hospital tests and procedure/radiological results at the follow up, please ask your primary care provider to get all Hospital records sent to his/her office.  In some cases, they will be blood work, cultures and biopsy results pending at the time of your discharge. Please request that your primary care provider follow up on these results.  If you are diabetic, please bring your blood sugar readings with you to your follow up appointment with primary care.    Please call and make your follow up appointments as soon as possible.    Also Note the following: If you experience worsening of your admission symptoms, develop shortness of breath, life threatening emergency, suicidal or homicidal thoughts you must seek medical attention immediately by calling 911 or calling your MD immediately  if symptoms less severe.  You must read complete instructions/literature along with all the possible adverse reactions/side effects for all the Medicines you take  and that have been prescribed to you. Take any new Medicines after you have completely understood and accpet all the possible adverse reactions/side effects.   Do not drive when taking Pain medications or sleeping medications (Benzodiazepines)  Do not take more than prescribed Pain, Sleep and Anxiety Medications. It is not advisable to combine anxiety,sleep and pain medications without talking with your primary care practitioner  Special Instructions: If you have smoked or chewed Tobacco  in the last 2 yrs please stop smoking, stop any regular Alcohol  and or any Recreational drug use.  Wear Seat belts while driving.  Do not drive if taking any narcotic, mind altering or controlled substances or recreational drugs or alcohol.

## 2018-12-02 NOTE — Progress Notes (Signed)
*  PRELIMINARY RESULTS* Echocardiogram 2D Echocardiogram has been performed.  Leavy Cella 12/02/2018, 12:16 PM

## 2018-12-02 NOTE — Discharge Summary (Addendum)
Physician Discharge Summary  Felicia Greer Estala ZOX:[id number] DOB: 04/17/1960 DOA: 12/01/2018  PCP: Elfredia NevinsFusco, Lawrence, MD  Admit date: 12/01/2018 Discharge date: 12/02/2018  Admitted From:  Home  Disposition: Home   Recommendations for Outpatient Follow-up:  Follow up with PCP in 1 weeks Follow up with Dr. Gerilyn [place] neurologist in 2 weeks   Discharge Condition: STABLE   CODE STATUS: FULL    Brief Hospitalization Summary: Please see all hospital notes, images, labs for full details of the hospitalization. Dr. Teodoro KilGadhia HPI: Felicia Greer Reppond is a 58 y.o. female with medical history significant of anxiety, migraine headaches, osteoarthritis, osteoporosis who presented to the ER with left facial numbness.  Patient was last known well at 10:30 PM on 11/30/2018 when she went to sleep.  She got up this morning on 12/01/2018 at 7:30 AM with mild lightheadedness, feeling of mild confusion.  Subsequently around 12 PM she reported having left facial tingling and numbness. Patient did have similar symptoms of left facial numbness around 1 week ago but says that the symptoms resolved without intervention.  Today she continued to have tingling and numbness over the left side of the face which persisted and she decided to come into the ER. No facial deviation noted, no significant blurring of vision, no focal weakness, headache, syncope, seizures, falls, head injury, No nausea, vomiting, abdominal pain, diarrhea, fever, chills, cough, shortness of breath, palpitations, skin rash, dysuria. ED Course:  Vital Signs reviewed on presentation, significant for temperature 98, blood pressure 168/79, pulse of 73, saturation 98% on room air, respiratory 16. Labs reviewed, significant for sodium 136, potassium 4.3, chloride 100, BUN 9, creatinine 0.69, LFTs within normal limits, WBC 9.1, hemoglobin 13.1, hematocrit 40.3, platelets 312, PT/INR and PTT are normal. Imaging personally Reviewed, CT of the head shows no acute intracranial  abnormalities. EKG personally reviewed, shows sinus rhythm, borderline right axis deviation. Patient was seen by tele-neurology on-call in the ER who opined that patient probably had atypical symptoms however would recommend admission for MRI and further work-up.  Pt was admitted for TIA work up and patient had an MRI this morning with no acute ischemic findings.  Pt had normal carotid dopplers and pt was started on aspirin 325 mg daily for secondary prevention.  Pt is feeling better and wants to go home.  Pt to discharge home to complete work up outpatient.  Pt wants to follow up with neurologist outpatient.  Pt has a PCP and will follow up within a week to have her stroke work reviewed and further tests ordered as needed.  Strongly advised patient to follow up with neurologist to discuss MRI and other symptoms.  Pt verbalized understanding.     Discharge Diagnoses:  Active Problems:   Thyroid goiter   Chronic anxiety   Depression   Facial numbness   TIA  - ruled in   Discharge Instructions: Discharge Instructions     Diet - low sodium heart healthy   Complete by: As directed    Increase activity slowly   Complete by: As directed       Allergies as of 12/02/2018       Reactions   Codeine Nausea Only   Metrogel [metronidazole]    Had local reaction   Protonix [pantoprazole Sodium] Rash        Medication List     STOP taking these medications    aspirin 81 MG chewable tablet Replaced by: aspirin 325 MG tablet       TAKE these medications  ALPRAZolam 1 MG tablet Commonly known as: XANAX Take 1 tablet by mouth at bedtime. *May take 1 tablet three times daily as needed for anxiety   amLODipine 5 MG tablet Commonly known as: NORVASC Take 5 mg by mouth every evening.   aspirin 325 MG tablet Take 1 tablet (325 mg total) by mouth daily. Start taking on: December 03, 2018 Replaces: aspirin 81 MG chewable tablet   Calcium 1200 1200-1000 MG-UNIT Chew Chew 1  capsule by mouth daily. What changed:  medication strength how much to take   HYDROcodone-acetaminophen 5-325 MG tablet Commonly known as: NORCO/VICODIN Take 1 tablet by mouth at bedtime. *May take up to three times daily as needed for pain   nortriptyline 50 MG capsule Commonly known as: PAMELOR Take 50 mg by mouth at bedtime.   oxybutynin 5 MG tablet Commonly known as: DITROPAN Take 5 mg by mouth at bedtime.   venlafaxine 75 MG tablet Commonly known as: EFFEXOR Take 75 mg by mouth 2 (two) times daily.       Follow-up Information     Redmond School, MD. Schedule an appointment as soon as possible for a visit in 1 week(s).   Specialty: Internal Medicine Contact information: [address] [place] [address] [phone]         Phillips Odor, MD. Schedule an appointment as soon as possible for a visit in 2 week(s).   Specialty: Neurology Why: Hospital Follow Up: Discuss MRI results  Contact information: [address] [place] [facility] [address] [phone]           Allergies  Allergen Reactions   Codeine Nausea Only   Metrogel [Metronidazole]     Had local reaction   Protonix [Pantoprazole Sodium] Rash   Allergies as of 12/02/2018       Reactions   Codeine Nausea Only   Metrogel [metronidazole]    Had local reaction   Protonix [pantoprazole Sodium] Rash        Medication List     STOP taking these medications    aspirin 81 MG chewable tablet Replaced by: aspirin 325 MG tablet       TAKE these medications    ALPRAZolam 1 MG tablet Commonly known as: XANAX Take 1 tablet by mouth at bedtime. *May take 1 tablet three times daily as needed for anxiety   amLODipine 5 MG tablet Commonly known as: NORVASC Take 5 mg by mouth every evening.   aspirin 325 MG tablet Take 1 tablet (325 mg total) by mouth daily. Start taking on: December 03, 2018 Replaces: aspirin 81 MG chewable tablet   Calcium 1200 1200-1000 MG-UNIT  Chew Chew 1 capsule by mouth daily. What changed:  medication strength how much to take   HYDROcodone-acetaminophen 5-325 MG tablet Commonly known as: NORCO/VICODIN Take 1 tablet by mouth at bedtime. *May take up to three times daily as needed for pain   nortriptyline 50 MG capsule Commonly known as: PAMELOR Take 50 mg by mouth at bedtime.   oxybutynin 5 MG tablet Commonly known as: DITROPAN Take 5 mg by mouth at bedtime.   venlafaxine 75 MG tablet Commonly known as: EFFEXOR Take 75 mg by mouth 2 (two) times daily.        Procedures/Studies: Ct Head Wo Contrast  Result Date: 12/01/2018 CLINICAL DATA:  Headache left-sided facial numbness and blurry vision EXAM: CT HEAD WITHOUT CONTRAST TECHNIQUE: Contiguous axial images were obtained from the base of the skull through the vertex without intravenous contrast. COMPARISON:  None. FINDINGS: Brain: No evidence of acute infarction, hemorrhage, hydrocephalus, extra-axial collection or mass lesion/mass effect. Vascular: No hyperdense vessel or unexpected calcification. Skull: Normal. Negative for fracture or focal lesion. Sinuses/Orbits: No acute finding. Other: None IMPRESSION: Negative non contrasted CT appearance of the brain Electronically Signed   By: Jasmine Pang M.D.   On: 12/01/2018 15:59   Mr Laqueta Jean ZC Contrast  Result Date: 12/02/2018 CLINICAL DATA:  Left-sided facial numbness, confusion, and weakness beginning yesterday morning. EXAM: MRI HEAD WITHOUT AND WITH CONTRAST TECHNIQUE: Multiplanar, multiecho pulse sequences of the brain and surrounding structures were obtained without and with intravenous contrast. CONTRAST:  22mL GADAVIST GADOBUTROL 1 MMOL/ML IV SOLN COMPARISON:  CT head without contrast 12/01/2018. FINDINGS: Brain: No acute infarct, hemorrhage, or mass lesion is present. Periventricular and subcortical T2 hyperintensities bilaterally are mildly advanced for age. The ventricles are of normal size. No significant  extraaxial fluid collection is present. The internal auditory canals are within normal limits. The brainstem and cerebellum are within normal limits. Postcontrast images demonstrate no pathologic enhancement. Vascular: Flow is present in the major intracranial arteries. Skull and upper cervical spine: The craniocervical junction is normal. Upper cervical spine is within normal limits. Marrow signal is unremarkable. Sinuses/Orbits: The paranasal sinuses and mastoid air cells are clear. The globes and orbits are within normal limits. IMPRESSION: 1. No acute intracranial abnormality. 2. Periventricular and subcortical T2 hyperintensities bilaterally are mildly advanced for age. The finding is nonspecific but can be seen in the setting of chronic microvascular ischemia, a demyelinating process such as multiple sclerosis, vasculitis, complicated migraine headaches, or as the sequelae of a prior infectious or inflammatory process. Electronically Signed   By: Marin Roberts M.D.   On: 12/02/2018 08:00   US Carotid Bilateral (at [facility] Only)  Result Date: 12/02/2018 CLINICAL DATA:  58 year old female with facial numbness EXAM: BILATERAL CAROTID DUPLEX ULTRASOUND TECHNIQUE: Wallace Cullens scale imaging, color Doppler and duplex ultrasound were performed of bilateral carotid and vertebral arteries in the neck. COMPARISON:  None. FINDINGS: Criteria: Quantification of carotid stenosis is based on velocity parameters that correlate the residual internal carotid diameter with NASCET-based stenosis levels, using the diameter of the distal internal carotid lumen as the denominator for stenosis measurement. The following velocity measurements were obtained: RIGHT ICA:  Systolic 80 cm/sec, Diastolic 31 cm/sec CCA:  77 cm/sec SYSTOLIC ICA/CCA RATIO:  1.0 ECA:  70 cm/sec LEFT ICA:  Systolic 82 cm/sec, Diastolic 27 cm/sec CCA:  80 cm/sec SYSTOLIC ICA/CCA RATIO:  1.0 ECA:  75 cm/sec Right Brachial SBP: Not acquired Left Brachial  SBP: Not acquired RIGHT CAROTID ARTERY: No significant calcified disease of the right common carotid artery. Intermediate waveform maintained. Heterogeneous plaque without significant calcifications at the right carotid bifurcation. Low resistance waveform of the right ICA. No significant tortuosity. RIGHT VERTEBRAL ARTERY: Antegrade flow with low resistance waveform. LEFT CAROTID ARTERY: No significant calcified disease of the left common carotid artery. Intermediate waveform maintained. Heterogeneous plaque at the left carotid bifurcation without significant calcifications. Low resistance waveform of the left ICA. LEFT VERTEBRAL ARTERY:  Antegrade flow with low resistance waveform. IMPRESSION: Color duplex indicates minimal heterogeneous plaque, with no hemodynamically significant stenosis by duplex criteria in the extracranial cerebrovascular circulation. Signed, Yvone Neu. Reyne Dumas, RPVI [facility] Electronically Signed   By: Gilmer Mor D.O.   On: 12/02/2018 09:41     Subjective: Pt reports that symptoms have resolved.   Discharge Exam: Vitals:  12/02/18 1000 12/02/18 1100  BP: 136/87 139/81  Pulse:    Resp:    Temp:    SpO2:     Vitals:   12/02/18 0915 12/02/18 0930 12/02/18 1000 12/02/18 1100  BP:   136/87 139/81  Pulse: 74 89    Resp:      Temp:      TempSrc:      SpO2: 100% 95%    Weight:      Height:       General: Pt is alert, awake, not in acute distress Cardiovascular: RRR, S1/S2 +, no rubs, no gallops Respiratory: CTA bilaterally, no wheezing, no rhonchi Abdominal: Soft, NT, ND, bowel sounds + Extremities: no edema, no cyanosis Neurological: nonfocal exam.    The results of significant diagnostics from this hospitalization (including imaging, microbiology, ancillary and laboratory) are listed below for reference.     Microbiology: No results found for this or any previous visit (from the past 240  hour(s)).   Labs: BNP (last 3 results) No results for input(s): BNP in the last 8760 hours. Basic Metabolic Panel: Recent Labs  Lab 12/01/18 1416  NA 136  K 4.3  CL 100  CO2 27  GLUCOSE 114*  BUN 9  CREATININE 0.69  CALCIUM 9.2   Liver Function Tests: Recent Labs  Lab 12/01/18 1416  AST 26  ALT 22  ALKPHOS 73  BILITOT 0.6  PROT 7.1  ALBUMIN 4.3   No results for input(s): LIPASE, AMYLASE in the last 168 hours. No results for input(s): AMMONIA in the last 168 hours. CBC: Recent Labs  Lab 12/01/18 1416  WBC 9.1  NEUTROABS 6.7  HGB 13.1  HCT 40.3  MCV 97.8  PLT 312   Cardiac Enzymes: No results for input(s): CKTOTAL, CKMB, CKMBINDEX, TROPONINI in the last 168 hours. BNP: Invalid input(s): POCBNP CBG: No results for input(s): GLUCAP in the last 168 hours. D-Dimer No results for input(s): DDIMER in the last 72 hours. Hgb A1c No results for input(s): HGBA1C in the last 72 hours. Lipid Profile Recent Labs    12/02/18 0258  CHOL 216*  HDL 104  LDLCALC 99  TRIG 63  CHOLHDL 2.1   Thyroid function studies No results for input(s): TSH, T4TOTAL, T3FREE, THYROIDAB in the last 72 hours.  Invalid input(s): FREET3 Anemia work up No results for input(s): VITAMINB12, FOLATE, FERRITIN, TIBC, IRON, RETICCTPCT in the last 72 hours. Urinalysis    Component Value Date/Time   COLORURINE STRAW (A) 10/07/2011 0259   APPEARANCEUR Cloudy (A) 01/30/2018 1430   LABSPEC 1.007 10/07/2011 0259   PHURINE 7.0 10/07/2011 0259   GLUCOSEU Negative 01/30/2018 1430   HGBUR NEGATIVE 10/07/2011 0259   BILIRUBINUR Negative 01/30/2018 1430   KETONESUR NEGATIVE 10/07/2011 0259   PROTEINUR Negative 01/30/2018 1430   PROTEINUR NEGATIVE 10/07/2011 0259   UROBILINOGEN 0.2 10/07/2011 0259   NITRITE Positive (A) 01/30/2018 1430   NITRITE NEGATIVE 10/07/2011 0259   LEUKOCYTESUR 3+ (A) 01/30/2018 1430   Sepsis Labs Invalid input(s): PROCALCITONIN,  WBC,  LACTICIDVEN Microbiology No  results found for this or any previous visit (from the past 240 hour(s)).  Time coordinating discharge:   SIGNED:  Standley Dakins, MD  [facility] 12/02/2018, 11:49 AM How to contact the [facility] Attending or Consulting provider 7A - 7P or covering provider during after hours 7P -7A, for this patient?  Check the care team in [facility] and look for a) attending/consulting [facility] provider listed and b) the [facility] team listed Log into [email or web address]  and use 's universal password to access. If you do not have the password, please contact the hospital operator. Locate the [facility] provider you are looking for under [facility] and page to a number that you can be directly reached. If you still have difficulty reaching the provider, please page the [facility] (Director on Call) for the Hospitalists listed on amion for assistance.

## 2019-01-03 IMAGING — US US EXTREM LOW VENOUS*L*
1 series · 13 of 24 positions shown · non-contrast
Comparison: None.

CLINICAL DATA: Left lower extremity pain for 4 days.



[Series 1: us extrem low venous*left* · 0.06mm/px · 13 of 35 slices shown]
[im 1/35]
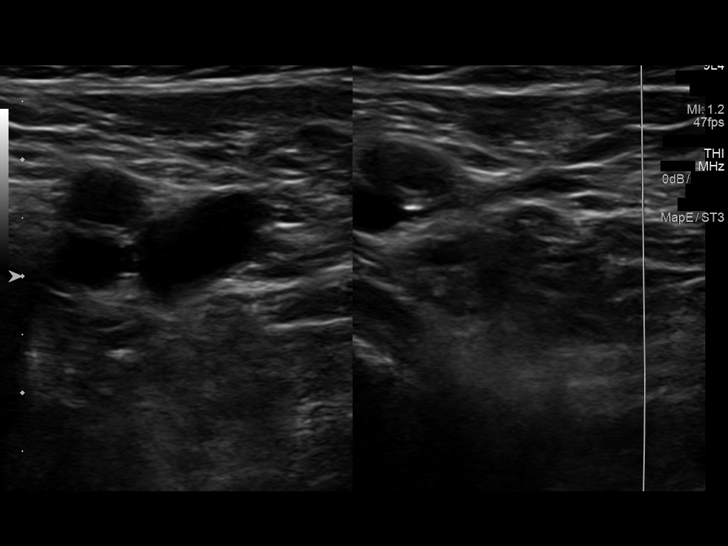
[im 3/35]
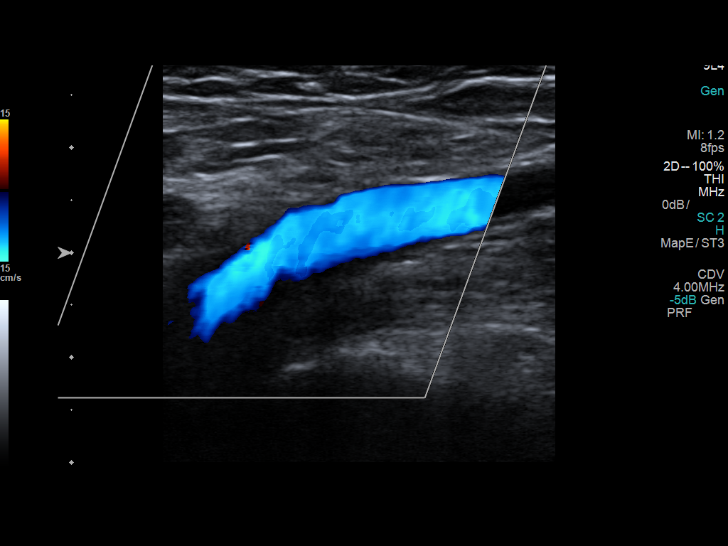
[im 6/35]
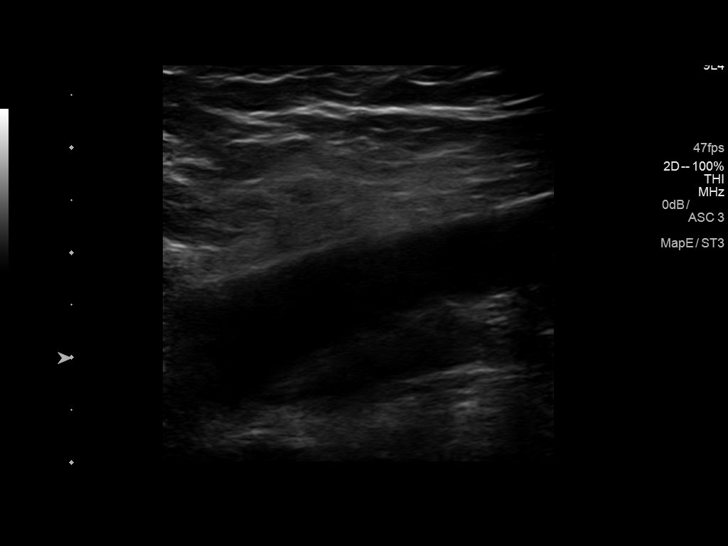
[im 9/35]
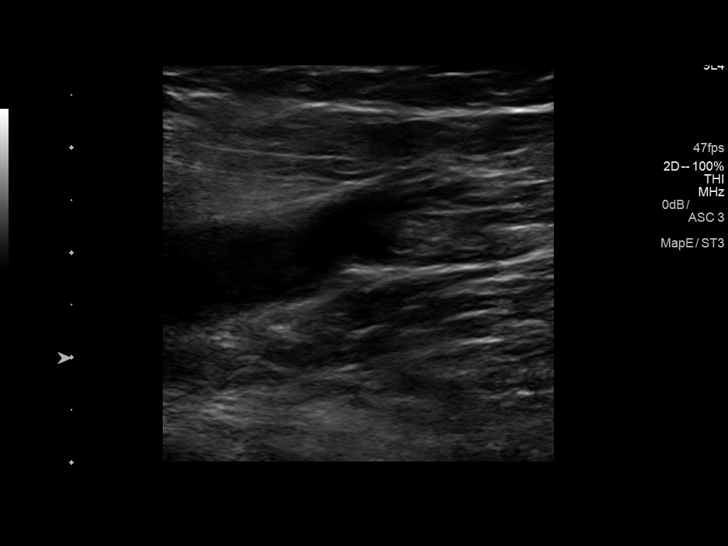
[im 12/35]
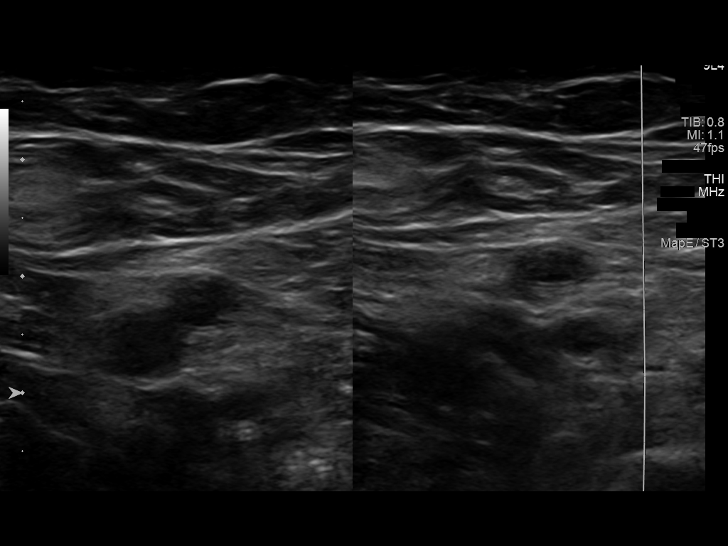
[im 15/35]
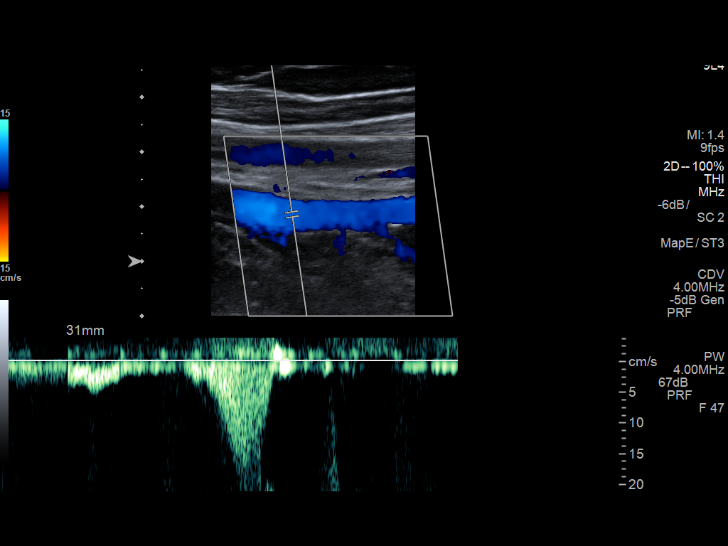
[im 18/35]
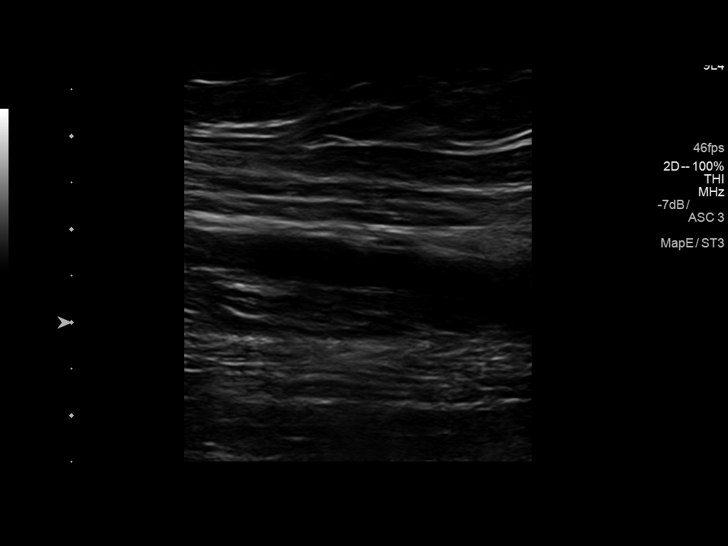
[im 20/35]
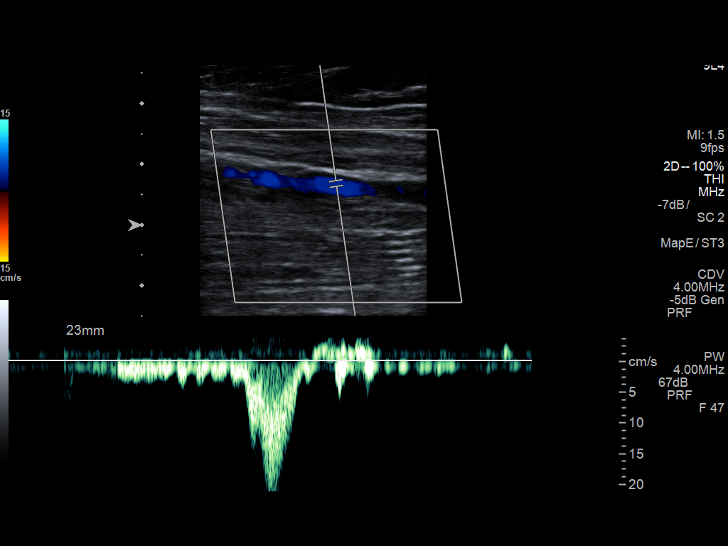
[im 23/35]
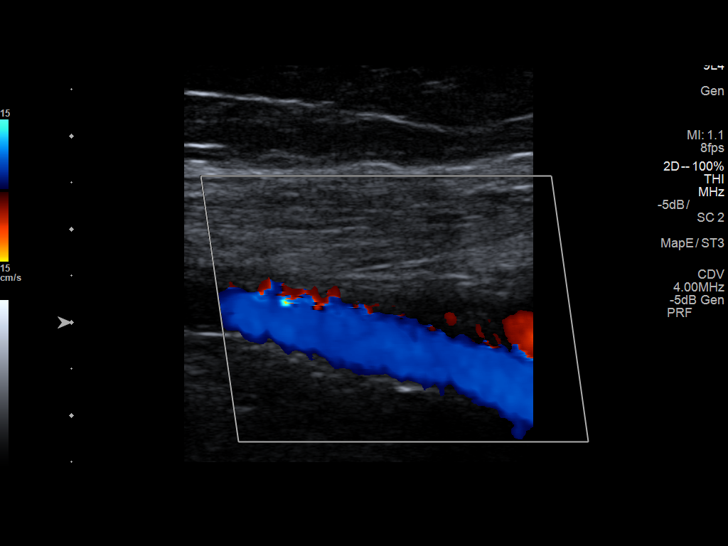
[im 26/35]
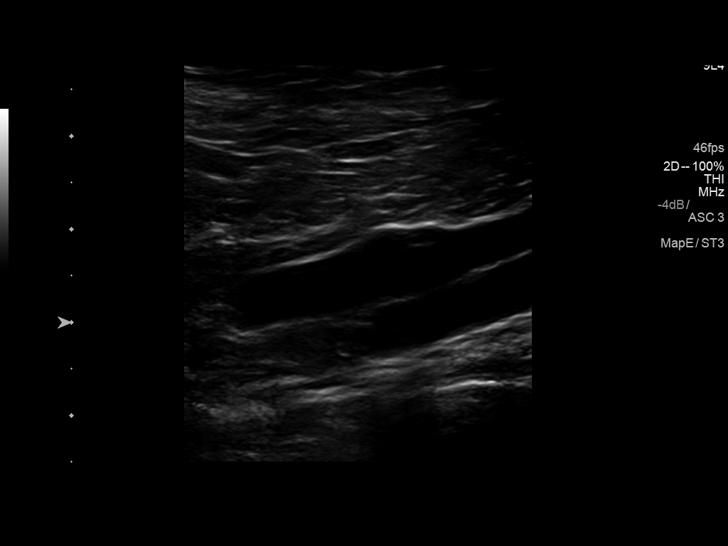
[im 29/35]
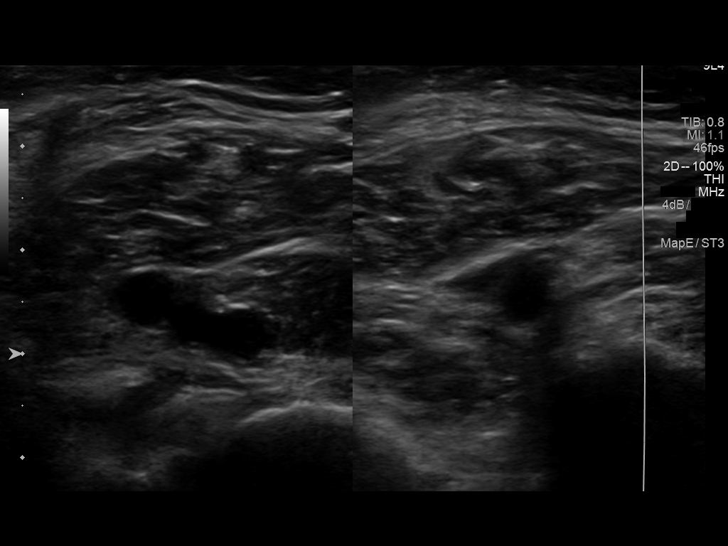
[im 32/35]
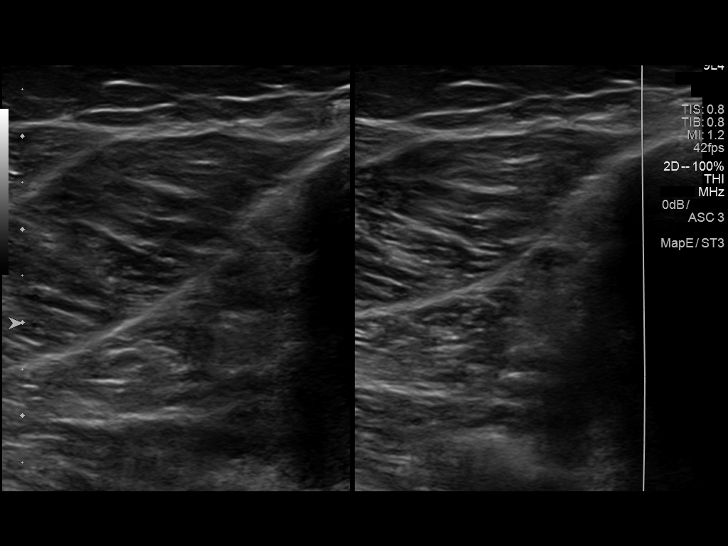
[im 35/35]
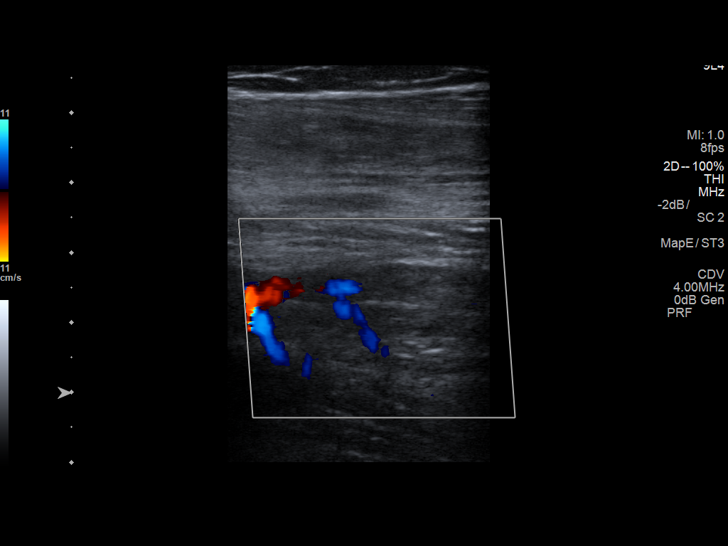

[13 of 24 positions shown; findings below may reference images not displayed]

FINDINGS: Contralateral Common Femoral Vein: Respiratory phasicity is normal
and symmetric with the symptomatic side. No evidence of thrombus.
Normal compressibility.

Common Femoral Vein: No evidence of thrombus. Normal
compressibility, respiratory phasicity and response to augmentation.

Saphenofemoral Junction: No evidence of thrombus. Normal
compressibility and flow on color Doppler imaging.

Profunda Femoral Vein: No evidence of thrombus. Normal
compressibility and flow on color Doppler imaging.

Femoral Vein: No evidence of thrombus. Normal compressibility,
respiratory phasicity and response to augmentation.

Popliteal Vein: No evidence of thrombus. Normal compressibility,
respiratory phasicity and response to augmentation.

Calf Veins: No evidence of thrombus. Normal compressibility and flow
on color Doppler imaging.

Superficial Great Saphenous Vein: No evidence of thrombus. Normal
compressibility and flow on color Doppler imaging.

Venous Reflux:  None.

Other Findings:  None.
IMPRESSION: No evidence of DVT within the left lower extremity.

## 2019-01-27 ENCOUNTER — Ambulatory Visit: Payer: Self-pay | Admitting: Neurology

## 2019-03-02 NOTE — Progress Notes (Signed)
NEUROLOGY CONSULTATION NOTE  Maia Handa MRN: [id number] DOB: March 17, 1960  Referring provider: Elfredia Nevins, MD Primary care provider: Elfredia Nevins, MD  Reason for consult:  TIA  HISTORY OF PRESENT ILLNESS: Felicia Greer is a 59 year old right-handed white female with COPD, HTN, low back pain and chronic neck pain with history of cervical spinal fusion whom I saw for migraines in 2017 presents for TIA.  History supplemented by ED and referring provider notes.  Hot in ears, face tingling, weak in shoulders and arms.  Tingling in right arm.  Daily, unsure positional.  Right shoulder tight.  Baseline thumb numbness now first 3 digits 2 months no pain .  Feels weak in arms and hips.    She presented to the [facility] ED on 12/01/2018 after experiencing two transient episodes over the course of that week.  Earlier in the week, she had a brief episode of left facial numbness and tingling, hot sensation in her ears, weakness in both shoulders and arms with possible blurred vision.  She checked her blood pressure which was reportedly 189 systolic.  On day of ED visit, she woke up with similar symptoms that did not immediately abort but was gradually improving.  No associated headache, speech disturbance.  Neurologic exam in ED was reportedly unremarkable except for mild tremor in hands.  Blood pressure was 168/79.    Labs were unremarkable.  EKG showed prolonged QTc interval 499, similar to prior test in 2013, but otherwise unremarkable with no evidence of arrhythmia or acute ischemia.  CT head without contrast was negative for acute intracranial abnormality.  MRI of brain without contrast was personally reviewed and showed mild nonspecific cerebral white matter changes but no acute intracranial abnormality.  Carotid ultrasound showed minimal heterogeneous plaque but no hemodynamically significant stenosis.  Labs demonstrated HIV negative, Covid negative, LDL 99 and Hgb A1c 5.2.  Complicated migraine  was considered by neurology, although patient was currently without headache and had not had a migraine in a while.  She was discharged on ASA.  Her PCP prescribed her a statin but she has since stopped it about 3 weeks ago due to concern that it may cause hair loss.  She reports other symptoms as well.  She states that she has generalized weakness and muscle aches in the shoulders, arms and hips.  This has been going on for several months, well before her ED visit.  She has history of cervical spine fusion with residual numbness of her right thumb.  About 2 months ago, she developed recurrence of pain and tightness in the right side of her neck and shoulder with numbness and tingling involving the first 3 digits of her right hand radiating up the arm to her shoulder.  She is not sure if it is positional.    PAST MEDICAL HISTORY: Past Medical History:  Diagnosis Date  . Acid reflux   . Arthritis    pelvic bone  . Back pain   . BV (bacterial vaginosis) 4 18 2012  . COPD (chronic obstructive pulmonary disease) (HCC) 2010  . Dyspareunia, female 01/05/2015  . Hx of goiter   . Hx: UTI (urinary tract infection)   . Hypertension   . Osteopenia determined by x-ray 03/10/2014  . Pelvic pain in female 03/01/2014  . Rhinitis 02/15/2014  . Urinary frequency 01/05/2015  . Vaginal odor 01/05/2015    PAST SURGICAL HISTORY: Past Surgical History:  Procedure Laterality Date  . BIOPSY THYROID N/A 06/2009   had  fna  . BREAST BIOPSY Right in the 1990s  . BREAST BIOPSY  1980   benign  . CERVICAL FUSION N/A in the 1990s  . HYSTEROSCOPY W/ ENDOMETRIAL ABLATION N/A 2004  . TUBAL LIGATION      MEDICATIONS: Current Outpatient Medications on File Prior to Visit  Medication Sig Dispense Refill  . ALPRAZolam (XANAX) 1 MG tablet Take 1 tablet by mouth at bedtime. *May take 1 tablet three times daily as needed for anxiety  0  . amLODipine (NORVASC) 5 MG tablet Take 5 mg by mouth every evening.   10  . aspirin  325 MG tablet Take 1 tablet (325 mg total) by mouth daily.    . Calcium Carbonate-Vit D-Min (CALCIUM 1200) 1200-1000 MG-UNIT CHEW Chew 1 capsule by mouth daily. 30 tablet   . HYDROcodone-acetaminophen (NORCO/VICODIN) 5-325 MG tablet Take 1 tablet by mouth at bedtime. *May take up to three times daily as needed for pain  0  . nortriptyline (PAMELOR) 50 MG capsule Take 50 mg by mouth at bedtime.    Marland Kitchen oxybutynin (DITROPAN) 5 MG tablet Take 5 mg by mouth at bedtime.    Marland Kitchen venlafaxine (EFFEXOR) 75 MG tablet Take 75 mg by mouth 2 (two) times daily.     No current facility-administered medications on file prior to visit.    ALLERGIES: Allergies  Allergen Reactions  . Codeine Nausea Only  . Metrogel [Metronidazole]     Had local reaction  . Protonix [Pantoprazole Sodium] Rash    FAMILY HISTORY: Family History  Problem Relation Age of Onset  . Cancer Maternal Aunt        cervical   . Breast cancer Maternal Grandmother   . Breast cancer Other   . Fibromyalgia Mother   . Other Mother        sciatic nerve; bad shoulder  . Colon cancer Mother   . Other Father        collapsed lung  . COPD Father   . Heart attack Father   . Other Sister        valves replaced in heart  . Heart attack Sister   . Anxiety disorder Brother   . Other Brother        takes med for heart  . Anxiety disorder Daughter   . Emphysema Maternal Grandfather   . Heart attack Paternal Grandmother   . Breast cancer Maternal Aunt   . Cancer Other        breast   SOCIAL HISTORY: Social History   Socioeconomic History  . Marital status: Married    Spouse name: Not on file  . Number of children: Not on file  . Years of education: Not on file  . Highest education level: Not on file  Occupational History  . Not on file  Tobacco Use  . Smoking status: Current Every Day Smoker    Packs/day: 0.00    Years: 38.00    Pack years: 0.00    Types: E-cigarettes    Start date: 10/25/1972    Last attempt to quit:  12/21/2010    Years since quitting: 8.2  . Smokeless tobacco: Never Used  . Tobacco comment: use electronic cigs  Substance and Sexual Activity  . Alcohol use: No    Alcohol/week: 0.0 standard drinks  . Drug use: No  . Sexual activity: Not Currently    Partners: Male    Birth control/protection: Surgical    Comment: ablation and tubal  Other Topics Concern  .  Not on file  Social History Narrative   Lives with husband in a one story home with a basement.  Has 3 children.  Education: 10th grade.  Stays at home watching her grandkids.   Social Determinants of Health   Financial Resource Strain:   . Difficulty of Paying Living Expenses: Not on file  Food Insecurity:   . Worried About Charity fundraiser in the Last Year: Not on file  . Ran Out of Food in the Last Year: Not on file  Transportation Needs:   . Lack of Transportation (Medical): Not on file  . Lack of Transportation (Non-Medical): Not on file  Physical Activity:   . Days of Exercise per Week: Not on file  . Minutes of Exercise per Session: Not on file  Stress:   . Feeling of Stress : Not on file  Social Connections:   . Frequency of Communication with Friends and Family: Not on file  . Frequency of Social Gatherings with Friends and Family: Not on file  . Attends Religious Services: Not on file  . Active Member of Clubs or Organizations: Not on file  . Attends Archivist Meetings: Not on file  . Marital Status: Not on file  Intimate Partner Violence:   . Fear of Current or Ex-Partner: Not on file  . Emotionally Abused: Not on file  . Physically Abused: Not on file  . Sexually Abused: Not on file    REVIEW OF SYSTEMS: Constitutional: No fevers, chills, or sweats, no generalized fatigue, change in appetite Eyes: No visual changes, double vision, eye pain Ear, nose and throat: No hearing loss, ear pain, nasal congestion, sore throat Cardiovascular: No chest pain, palpitations Respiratory:  No  shortness of breath at rest or with exertion, wheezes GastrointestinaI: No nausea, vomiting, diarrhea, abdominal pain, fecal incontinence Genitourinary:  No dysuria, urinary retention or frequency Musculoskeletal:  No neck pain, back pain Integumentary: No rash, pruritus, skin lesions Neurological: as above Psychiatric: No depression, insomnia, anxiety Endocrine: No palpitations, fatigue, diaphoresis, mood swings, change in appetite, change in weight, increased thirst Hematologic/Lymphatic:  No purpura, petechiae. Allergic/Immunologic: no itchy/runny eyes, nasal congestion, recent allergic reactions, rashes  PHYSICAL EXAM: Blood pressure 110/67, pulse 78, height 5\' 4"  (1.626 m), weight 106 lb (48.1 kg), SpO2 99 %. General: No acute distress.  Patient appears well-groomed.  Head:  Normocephalic/atraumatic Eyes:  fundi examined but not visualized Neck: supple, no paraspinal tenderness, full range of motion Back: No paraspinal tenderness Heart: regular rate and rhythm Lungs: Clear to auscultation bilaterally. Vascular: No carotid bruits. Neurological Exam: Mental status: alert and oriented to person, place, and time, recent and remote memory intact, fund of knowledge intact, attention and concentration intact, speech fluent and not dysarthric, language intact. Cranial nerves: CN I: not tested CN II: pupils equal, round and reactive to light, visual fields intact CN III, IV, VI:  full range of motion, no nystagmus, no ptosis CN V: facial sensation intact CN VII: upper and lower face symmetric CN VIII: hearing intact CN IX, X: gag intact, uvula midline CN XI: sternocleidomastoid and trapezius muscles intact CN XII: tongue midline Bulk & Tone: normal, no fasciculations. Motor:  5/5 throughout  Sensation:  Pinprick sensation reduced in first 3 digits of right hand and forearm; and vibration sensation intact. Deep Tendon Reflexes:  2+ throughout, toes downgoing.  Finger to nose testing:   Without dysmetria.  Heel to shin:  Without dysmetria.  Gait:  Normal station and stride.  Able to  turn and tandem walk. Romberg negative.  IMPRESSION: 1.  Transient events.  I cannot say for sure that these were TIAs.  Suspicion is low.  Only lateralizing symptom was subjective left sided facial numbness but her other symptoms were vague and not typically symptoms of stroke.  MRI negative.  However, as exact etiology unknown, I would still continue ASA.  May also consider complicated migraine, but also uncertain. 2.  Right sided cervical radiculopathy, possibly C6.  May consider carpal tunnel syndrome as well but leaning towards radiculopathy given overall semiology and her past medical history.  Given the associated weakness and numbness, I would like to order MRI of cervical spine to evaluate for any structural cause that may benefit from epidural injection or possible surgery. 3.  Generalized weakness/myalgias.  She is no longer on statin and symptoms preceded its initiation.  Will check labs for autoimmune/infectious/vitamin deficiency etiologies.  PLAN: 1.  MRI of cervical spine without contrast 2.  Check ANA, sed rate, CRP, B12, TSH, Lyme, vitamin D, CK, aldolase. 3.  Further recommendations pending results. 4.  Continue ASA 81mg  daily  Thank you for allowing me to take part in the care of this patient.  , DO  CC:  Shon Millet, MD

## 2019-03-04 ENCOUNTER — Other Ambulatory Visit: Payer: 59

## 2019-03-04 ENCOUNTER — Encounter: Payer: Self-pay | Admitting: Neurology

## 2019-03-04 ENCOUNTER — Ambulatory Visit (INDEPENDENT_AMBULATORY_CARE_PROVIDER_SITE_OTHER): Payer: 59 | Admitting: Neurology

## 2019-03-04 ENCOUNTER — Other Ambulatory Visit: Payer: Self-pay

## 2019-03-04 VITALS — BP 110/67 | HR 78 | Ht 64.0 in | Wt 106.0 lb

## 2019-03-04 DIAGNOSIS — M791 Myalgia, unspecified site: Secondary | ICD-10-CM

## 2019-03-04 DIAGNOSIS — R531 Weakness: Secondary | ICD-10-CM

## 2019-03-04 DIAGNOSIS — R29898 Other symptoms and signs involving the musculoskeletal system: Secondary | ICD-10-CM

## 2019-03-04 DIAGNOSIS — R200 Anesthesia of skin: Secondary | ICD-10-CM | POA: Diagnosis not present

## 2019-03-04 DIAGNOSIS — M5412 Radiculopathy, cervical region: Secondary | ICD-10-CM

## 2019-03-04 LAB — VITAMIN D 25 HYDROXY (VIT D DEFICIENCY, FRACTURES): VITD: 33.89 ng/mL (ref 30.00–100.00)

## 2019-03-04 LAB — VITAMIN B12: Vitamin B-12: 182 pg/mL — ABNORMAL LOW (ref 211–911)

## 2019-03-04 LAB — CK: Total CK: 78 U/L (ref 7–177)

## 2019-03-04 LAB — SEDIMENTATION RATE: Sed Rate: 10 mm/hr (ref 0–30)

## 2019-03-04 LAB — TSH: TSH: 2.34 u[IU]/mL (ref 0.35–4.50)

## 2019-03-04 NOTE — Patient Instructions (Addendum)
1.  Will check MRI of cervical spine without contrast for cervical radiculopathy and right arm numbness and weakness.  Once your insurance has been approved the appointment should get scheduled.  IF you do not hear anything in 2 weeks or more please call [phone].  2.  Will check ANA, sed rate, CRP, B12, TSH, Lyme, vitamin D, CK, aldolase. Your provider has requested that you have labwork completed today. Please go to [facility] ([id number]) on the second floor of this building before leaving the office today. You do not need to check in. If you are not called within 15 minutes please check with the front desk.   3.  Continue aspirin 81mg  daily.   4.  Follow up after testing (may be virtual visit)

## 2019-03-05 LAB — ANA: Anti Nuclear Antibody (ANA): NEGATIVE

## 2019-03-05 LAB — LYME AB/WESTERN BLOT REFLEX
LYME DISEASE AB, QUANT, IGM: <0.8 index (ref 0.00–0.79)
Lyme IgG/IgM Ab: <0.91 {ISR} (ref 0.00–0.90)

## 2019-03-05 LAB — ALDOLASE: Aldolase: 3.7 U/L (ref <=8.1)

## 2019-03-30 ENCOUNTER — Ambulatory Visit
Admission: RE | Admit: 2019-03-30 | Discharge: 2019-03-30 | Disposition: A | Discharge status: Home / Self Care | Payer: 59 | Source: Ambulatory Visit | Attending: Neurology | Admitting: Neurology

## 2019-03-30 ENCOUNTER — Other Ambulatory Visit: Payer: Self-pay

## 2019-03-30 DIAGNOSIS — R200 Anesthesia of skin: Secondary | ICD-10-CM

## 2019-03-30 DIAGNOSIS — R531 Weakness: Secondary | ICD-10-CM

## 2019-03-30 DIAGNOSIS — R29898 Other symptoms and signs involving the musculoskeletal system: Secondary | ICD-10-CM

## 2019-03-30 DIAGNOSIS — M791 Myalgia, unspecified site: Secondary | ICD-10-CM

## 2019-03-30 DIAGNOSIS — M5412 Radiculopathy, cervical region: Secondary | ICD-10-CM

## 2019-04-05 ENCOUNTER — Ambulatory Visit: Payer: 59 | Admitting: Adult Health

## 2019-04-21 IMAGING — DX DG TIBIA/FIBULA 2V*L*
2 series · 2 of 2 positions shown · non-contrast
Comparison: None.

CLINICAL DATA: Left lower leg pain and swelling for 4 days. No
known injury.

EXAM:
LEFT TIBIA AND FIBULA - 2 VIEW

[tibia ap]
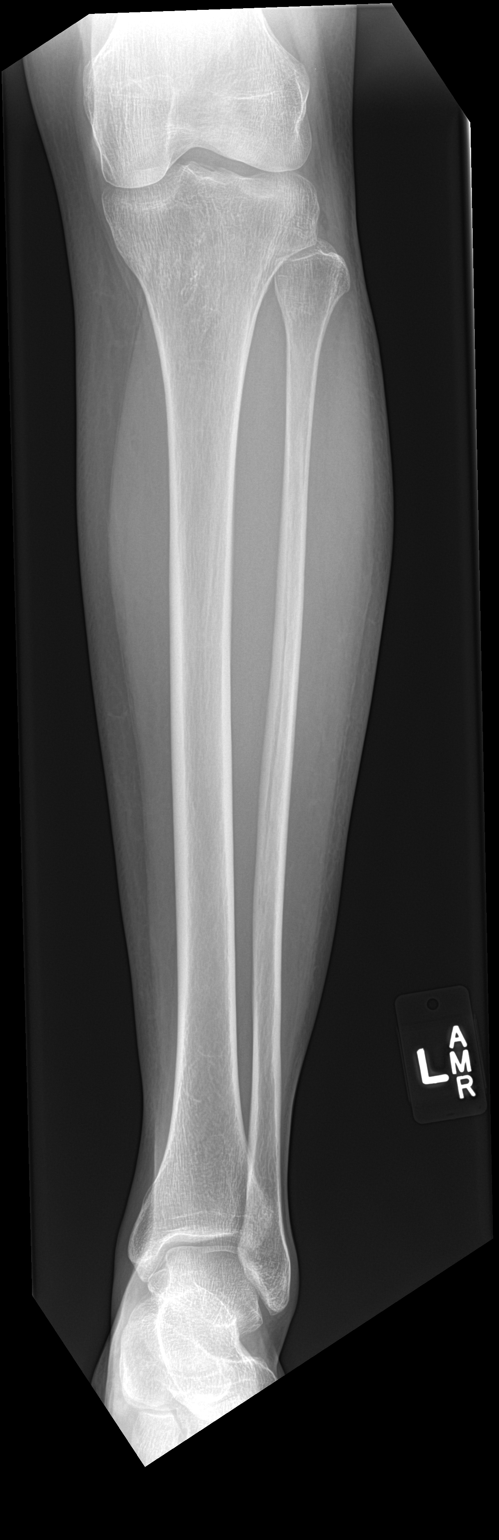

[tibia lat]
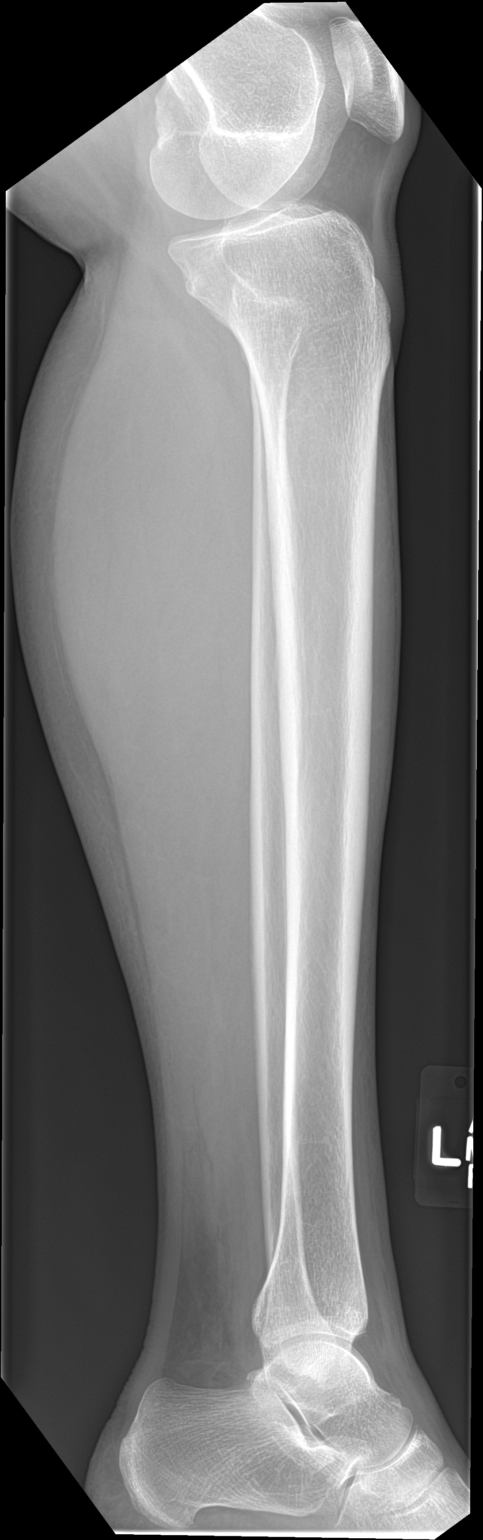

[2 of 2 positions shown; findings below may reference images not displayed]

FINDINGS: There is no evidence of fracture or other focal bone lesions. Soft
tissues are unremarkable.
IMPRESSION: Normal exam.

## 2019-06-15 ENCOUNTER — Ambulatory Visit (INDEPENDENT_AMBULATORY_CARE_PROVIDER_SITE_OTHER): Payer: 59 | Admitting: Adult Health

## 2019-06-15 ENCOUNTER — Other Ambulatory Visit (HOSPITAL_COMMUNITY)
Admission: RE | Admit: 2019-06-15 | Discharge: 2019-06-15 | Disposition: A | Discharge status: Home / Self Care | Payer: 59 | Source: Ambulatory Visit | Attending: Adult Health | Admitting: Adult Health

## 2019-06-15 ENCOUNTER — Encounter: Payer: Self-pay | Admitting: Adult Health

## 2019-06-15 VITALS — BP 128/82 | HR 68 | Ht 63.0 in | Wt 105.0 lb

## 2019-06-15 DIAGNOSIS — Z1212 Encounter for screening for malignant neoplasm of rectum: Secondary | ICD-10-CM

## 2019-06-15 DIAGNOSIS — N952 Postmenopausal atrophic vaginitis: Secondary | ICD-10-CM | POA: Diagnosis not present

## 2019-06-15 DIAGNOSIS — Z01419 Encounter for gynecological examination (general) (routine) without abnormal findings: Secondary | ICD-10-CM

## 2019-06-15 DIAGNOSIS — Z1211 Encounter for screening for malignant neoplasm of colon: Secondary | ICD-10-CM | POA: Diagnosis not present

## 2019-06-15 LAB — HEMOCCULT GUIAC POC 1CARD (OFFICE): Fecal Occult Blood, POC: NEGATIVE

## 2019-06-15 NOTE — Progress Notes (Signed)
Patient ID: Felicia Greer, female   DOB: [birth year], 59 y.o.   MRN: [id number] History of Present Illness:  Felicia Greer is a 59 year old white female, married, PM in for pelvic and pap.Daughter has moved back in with her family and her mom died last year.  PCP is Dr. Sherwood Gambler.  Current Medications, Allergies, Past Medical History, Past Surgical History, Family History and Social History were reviewed in [employer] electronic medical record.     Review of Systems: Has lost weight but gaining some back now  +vaginal discharge, kinda brown smells sour for about a year  Not sexually active  Patient denies any headaches, hearing loss, fatigue, blurred vision, shortness of breath, chest pain, abdominal pain, problems with bowel movements, urination. No joint pain or mood swings.   Physical Exam:BP 128/82 (BP Location: Left Arm, Patient Position: Sitting, Cuff Size: Normal)   Pulse 68   Ht 5\' 3"  (1.6 m)   Wt 105 lb (47.6 kg)   BMI 18.60 kg/m  General:  Well developed, well nourished, no acute distress Skin:  Warm and dry Neck:  Midline trachea,thyroid enlarged slightly, had bx was goiter, good ROM, no lymphadenopathy Lungs; Clear to auscultation bilaterally Cardiovascular: Regular rate and rhythm Pelvic:  External genitalia is normal in appearance,has several angiokeratomas both labia.  The vagina is normal atrophic, with loss of color, moisture and rugae. Urethra has no lesions or masses. The cervix is smooth, pap with high risk HPV 16/18 genotyping performed.  Uterus is felt to be normal size, shape, and contour.  No adnexal masses or tenderness noted.Bladder is non tender, no masses felt. Rectal: Good sphincter tone, no polyps, or hemorrhoids felt.  Hemoccult negative. Psych:  No mood changes, alert and cooperative,seems happy Fall risk is low Co exam with NP student  Impression and Plan: 1. Encounter for gynecological examination with Papanicolaou smear of cervix Pap  sent Pap in 3 if normal Physical with PCP  Labs with PCP Get mammogram  2. Screening for colorectal cancer Get colonoscopy   3. Vaginal atrophy

## 2019-06-17 LAB — CYTOLOGY - PAP
Comment: NEGATIVE
Diagnosis: NEGATIVE
High risk HPV: NEGATIVE

## 2019-10-21 ENCOUNTER — Other Ambulatory Visit (HOSPITAL_COMMUNITY): Payer: Self-pay | Admitting: Internal Medicine

## 2019-10-21 DIAGNOSIS — Z1231 Encounter for screening mammogram for malignant neoplasm of breast: Secondary | ICD-10-CM

## 2019-11-01 ENCOUNTER — Ambulatory Visit (HOSPITAL_COMMUNITY): Payer: 59

## 2019-11-03 ENCOUNTER — Other Ambulatory Visit: Payer: Self-pay

## 2019-11-03 ENCOUNTER — Ambulatory Visit (HOSPITAL_COMMUNITY)
Admission: RE | Admit: 2019-11-03 | Discharge: 2019-11-03 | Disposition: A | Discharge status: Home / Self Care | Payer: 59 | Source: Ambulatory Visit | Attending: Internal Medicine | Admitting: Internal Medicine

## 2019-11-03 DIAGNOSIS — Z1231 Encounter for screening mammogram for malignant neoplasm of breast: Secondary | ICD-10-CM | POA: Insufficient documentation

## 2020-08-14 ENCOUNTER — Ambulatory Visit: Payer: Self-pay | Admitting: Neurology

## 2020-08-15 ENCOUNTER — Other Ambulatory Visit (HOSPITAL_COMMUNITY): Payer: Self-pay | Admitting: Internal Medicine

## 2020-08-15 ENCOUNTER — Ambulatory Visit (HOSPITAL_COMMUNITY)
Admission: RE | Admit: 2020-08-15 | Discharge: 2020-08-15 | Disposition: A | Discharge status: Home / Self Care | Payer: 59 | Source: Ambulatory Visit | Attending: Internal Medicine | Admitting: Internal Medicine

## 2020-08-15 ENCOUNTER — Other Ambulatory Visit: Payer: Self-pay

## 2020-08-15 DIAGNOSIS — R0600 Dyspnea, unspecified: Secondary | ICD-10-CM | POA: Insufficient documentation

## 2020-08-17 ENCOUNTER — Encounter: Payer: Self-pay | Admitting: Neurology

## 2020-08-17 ENCOUNTER — Ambulatory Visit: Payer: 59 | Admitting: Neurology

## 2020-08-17 VITALS — BP 119/84 | HR 76 | Ht 64.0 in | Wt 112.0 lb

## 2020-08-17 DIAGNOSIS — G43709 Chronic migraine without aura, not intractable, without status migrainosus: Secondary | ICD-10-CM | POA: Diagnosis not present

## 2020-08-17 DIAGNOSIS — E538 Deficiency of other specified B group vitamins: Secondary | ICD-10-CM

## 2020-08-17 MED ORDER — RIZATRIPTAN BENZOATE 10 MG PO TBDP: 10.0000 mg | ORAL_TABLET | ORAL | 11 refills | Status: DC | PRN

## 2020-08-17 MED ORDER — AJOVY 225 MG/1.5ML ~~LOC~~ SOAJ: 225.0000 mg | SUBCUTANEOUS | 0 refills | Status: DC

## 2020-08-17 MED ORDER — KETOROLAC TROMETHAMINE 60 MG/2ML IM SOLN
60.0000 mg | Freq: Once | INTRAMUSCULAR | Status: AC
  Administered 2020-08-17: 60 mg via INTRAMUSCULAR

## 2020-08-17 NOTE — Progress Notes (Signed)
[facility]    Provider:  Dr Lucia Gaskins Requesting Provider: Elfredia Nevins, MD Primary Care Provider:  Elfredia Nevins, MD  CC:  Migraines  HPI:  Felicia Greer is a 60 y.o. female here as requested by Elfredia Nevins, MD for Migraines.    She has pain and starts in the back of her neck, it is so tight and then radiates to the whole head, can be unilateral, then becomes pounding, pulsating, throbbing, photophobia/phonophobia, nausea, her ears pop, she is getting the migraines sometimes up to 3 days and they are severe, no aura, she had an episode of numbness on her chin, 15 headache days a month and 8 migraine days a month.    Tylenol, amlodipine, asa, celexa,lexapro, ibuprofen, metoprolol, naproxen, nortriptyline, prednisone, phenergan, imitrex, tizanidine, effexor,topiramate  Reviewed notes, labs and imaging from outside physicians, which showed:  MRI brain 11/2018: personally reviewed images and agree IMPRESSION: 1. No acute intracranial abnormality. 2. Periventricular and subcortical T2 hyperintensities bilaterally are mildly advanced for age. The finding is nonspecific but can be seen in the setting of chronic microvascular ischemia, a demyelinating process such as multiple sclerosis, vasculitis, complicated migraine headaches, or as the sequelae of a prior infectious or inflammatory process.   03/04/19 TSH nml, ck nml, B12 182  Review of Systems: Patient complains of symptoms per HPI as well as the following symptoms depression and anxiety. Pertinent negatives and positives per HPI. All others negative.   Social History   Socioeconomic History   Marital status: Married    Spouse name: Not on file   Number of children: Not on file   Years of education: Not on file   Highest education level: Not on file  Occupational History   Not on file  Tobacco Use   Smoking status: Some Days    Packs/day: 0.00    Years: 38.00    Pack years: 0.00    Types:  E-cigarettes, Cigarettes    Start date: 10/25/1972    Last attempt to quit: 12/21/2010    Years since quitting: 9.6   Smokeless tobacco: Never   Tobacco comments:    use electronic cigs  Vaping Use   Vaping Use: Some days   Substances: Nicotine, Flavoring  Substance and Sexual Activity   Alcohol use: No    Alcohol/week: 0.0 standard drinks   Drug use: No   Sexual activity: Not Currently    Partners: Male    Birth control/protection: Surgical, Post-menopausal    Comment: ablation and tubal  Other Topics Concern   Not on file  Social History Narrative   Lives with husband in a one story home with a basement.  Has 3 children.  Education: 10th grade.  Stays at home watching her grandkids.   Right handed   Caffeine: half caffeine in coffee in the morning, otherwise everything caffeine free    Social Determinants of Health   Financial Resource Strain: Not on file  Food Insecurity: Not on file  Transportation Needs: Not on file  Physical Activity: Not on file  Stress: Not on file  Social Connections: Not on file  Intimate Partner Violence: Not on file    Family History  Problem Relation Age of Onset   Fibromyalgia Mother    Other Mother        sciatic nerve; bad shoulder   Colon cancer Mother    Other Father        collapsed lung   COPD Father    Heart attack Father  Other Sister        valves replaced in heart   Heart attack Sister    Anxiety disorder Brother    Other Brother        takes med for heart   Breast cancer Maternal Grandmother    Emphysema Maternal Grandfather    Heart attack Paternal Grandmother    Anxiety disorder Daughter    Cancer Maternal Aunt        cervical    Breast cancer Maternal Aunt    Breast cancer Other    Migraines Other    Cancer Other        breast    Past Medical History:  Diagnosis Date   Acid reflux    Arthritis    pelvic bone   Back pain    BV (bacterial vaginosis) 4 18 2012   COPD (chronic obstructive pulmonary  disease) (HCC) 2010   Dyspareunia, female 01/05/2015   Hx of goiter    Hx: UTI (urinary tract infection)    Hypertension    Osteopenia determined by x-ray 03/10/2014   Pelvic pain in female 03/01/2014   Rhinitis 02/15/2014   Urinary frequency 01/05/2015   Vaginal odor 01/05/2015    Patient Active Problem List   Diagnosis Date Noted   Facial numbness 12/01/2018   Screening for colorectal cancer 10/27/2017   Encounter for gynecological examination with Papanicolaou smear of cervix 10/27/2017   Constipation 09/08/2017   Rectocele 09/08/2017   Encounter for well woman exam with routine gynecological exam 10/03/2016   Vaginal atrophy 10/03/2016   Vaginal odor 01/05/2015   Urinary frequency 01/05/2015   Nocturia more than twice per night 01/05/2015   Dyspareunia, female 01/05/2015   Osteopenia determined by x-ray 03/10/2014   Pelvic pain in female 03/01/2014   Back pain 03/01/2014   Depression 02/20/2014   Rhinitis 02/15/2014   Insomnia 09/19/2013   COPD exacerbation (HCC) 04/23/2012   Chronic anxiety 04/23/2012   Vaginal discharge 04/07/2012   Acid reflux 04/04/2012   Hx: UTI (urinary tract infection) 04/04/2012   Thyroid goiter 04/04/2012   BV (bacterial vaginosis) 04/04/2012    Past Surgical History:  Procedure Laterality Date   BIOPSY THYROID N/A 06/2009   had fna   BREAST BIOPSY Right in the 1990s   BREAST BIOPSY  1980   benign   CERVICAL FUSION N/A in the 1990s   HYSTEROSCOPY W/ ENDOMETRIAL ABLATION N/A 2004   TUBAL LIGATION      Current Outpatient Medications  Medication Sig Dispense Refill   ALPRAZolam (XANAX) 1 MG tablet Take 1 tablet by mouth at bedtime. *May take 1 tablet three times daily as needed for anxiety  0   aspirin 325 MG tablet Take 1 tablet (325 mg total) by mouth daily.     Fremanezumab-vfrm (AJOVY) 225 MG/1.5ML SOAJ Inject 225 mg into the skin every 30 (thirty) days. 6 mL 0   HYDROcodone-acetaminophen (NORCO/VICODIN) 5-325 MG tablet Take 1 tablet  by mouth at bedtime. *May take up to three times daily as needed for pain  0   nortriptyline (PAMELOR) 50 MG capsule Take 50 mg by mouth at bedtime.     oxybutynin (DITROPAN) 5 MG tablet Take 5 mg by mouth at bedtime.     rizatriptan (MAXALT-MLT) 10 MG disintegrating tablet Take 1 tablet (10 mg total) by mouth as needed for migraine. May repeat in 2 hours if needed 9 tablet 11   venlafaxine (EFFEXOR) 75 MG tablet Take 75 mg by mouth 2 (two)  times daily.     zolpidem (AMBIEN) 10 MG tablet Take 10 mg by mouth at bedtime as needed.     No current facility-administered medications for this visit.    Allergies as of 08/17/2020 - Review Complete 08/17/2020  Allergen Reaction Noted   Codeine Nausea Only 10/07/2011   Metrogel [metronidazole]  04/04/2012   Protonix [pantoprazole sodium] Rash 02/13/2013    Vitals: BP 119/84 (BP Location: Right Arm, Patient Position: Sitting)   Pulse 76   Ht 5\' 4"  (1.626 m)   Wt 112 lb (50.8 kg)   BMI 19.22 kg/m  Last Weight:  Wt Readings from Last 1 Encounters:  08/17/20 112 lb (50.8 kg)   Last Height:   Ht Readings from Last 1 Encounters:  08/17/20 5\' 4"  (1.626 m)     Physical exam: Exam: Gen: NAD, conversant, well nourised, well groomed                     CV: RRR, no MRG. No Carotid Bruits. No peripheral edema, warm, nontender Eyes: Conjunctivae clear without exudates or hemorrhage  Neuro: Detailed Neurologic Exam  Speech:    Speech is normal; fluent and spontaneous with normal comprehension.  Cognition:    The patient is oriented to person, place, and time;     recent and remote memory intact;     language fluent;     normal attention, concentration,     fund of knowledge Cranial Nerves:    The pupils are equal, round, and reactive to light. The fundi are flat. Visual fields are full to finger confrontation. Extraocular movements are intact. Trigeminal sensation is intact and the muscles of mastication are normal. The face is symmetric.  The palate elevates in the midline. Hearing intact. Voice is normal. Shoulder shrug is normal. The tongue has normal motion without fasciculations.   Coordination:    Normal   Gait:    normal.   Motor Observation:    No asymmetry, no atrophy, and no involuntary movements noted. Tone:    Normal muscle tone.    Posture:    Posture is normal. normal erect    Strength:    Strength is V/V in the upper and lower limbs.      Sensation: intact to LT     Reflex Exam:  DTR's:    Deep tendon reflexes in the upper and lower extremities are symmetrical bilaterally.   Toes:    The toes are downgoing bilaterally.   Clonus:    Clonus is absent.    Assessment/Plan:  60 year old with chronic migraines, failed multiple medications  Acute/Emergency: Rizatriptan: Please take one tablet at the onset of your headache. If it does not improve the symptoms please take one additional tablet. Do not take more then 2 tablets in 24hrs. Do not take use more then 2 to 3 times in a week.  Prevention: Ajovy monthly  Nausea: Let me know what you have tried in the past (she thinks maybe she tried zofran and it did not work) and we can prescribe something else to try  B12: Encouraged her to supplement, was 182 in the past, discussed sequelae. B12 daily po long term and needs to follow with pcp for rechecks; B12 deficiency can cause weakness, fatigue, easy bruising or bleeding,sore tongue, stomach upset, weight loss, and diarrhea or constipation, tingling or numbness to the fingers and toes, difficulty walking, mood changes, depression, memory loss, disorientation and, in severe cases, dementia. Recommend 1000-2000 mcg  B12 daily. recheck B12 in 8-12 weeks with pcp. She has not been compliant.    Meds ordered this encounter  Medications   rizatriptan (MAXALT-MLT) 10 MG disintegrating tablet    Sig: Take 1 tablet (10 mg total) by mouth as needed for migraine. May repeat in 2 hours if needed     Dispense:  9 tablet    Refill:  11   Fremanezumab-vfrm (AJOVY) 225 MG/1.5ML SOAJ    Sig: Inject 225 mg into the skin every 30 (thirty) days.    Dispense:  6 mL    Refill:  0    Patient has copay card; she can have medication regardless of insurance approval or copay amount. [facility] 09/2021   ketorolac (TORADOL) injection 60 mg   To prevent or relieve headaches, try the following: Cool Compress. Lie down and place a cool compress on your head.  Avoid headache triggers. If certain foods or odors seem to have triggered your migraines in the past, avoid them. A headache diary might help you identify triggers.  Include physical activity in your daily routine. Try a daily walk or other moderate aerobic exercise.  Manage stress. Find healthy ways to cope with the stressors, such as delegating tasks on your to-do list.  Practice relaxation techniques. Try deep breathing, yoga, massage and visualization.  Eat regularly. Eating regularly scheduled meals and maintaining a healthy diet might help prevent headaches. Also, drink plenty of fluids.  Follow a regular sleep schedule. Sleep deprivation might contribute to headaches Consider biofeedback. With this mind-body technique, you learn to control certain bodily functions -- such as muscle tension, heart rate and blood pressure -- to prevent headaches or reduce headache pain.    Proceed to emergency room if you experience new or worsening symptoms or symptoms do not resolve, if you have new neurologic symptoms or if headache is severe, or for any concerning symptom.   Provided education and documentation from American headache Society toolbox including articles on: chronic migraine medication overuse headache, chronic migraines, prevention of migraines, behavioral and other nonpharmacologic treatments for headache.   Cc: Elfredia Nevins, MD,  Elfredia Nevins, MD  Naomie Dean, MD  [facility] [address] [place], [place] [address]  Phone [phone] Fax [phone]

## 2020-08-17 NOTE — Progress Notes (Signed)
Pt here for migraines order for toradol 60mg /14ml injection.  Under aseptic technique toradol 60mg /14ml IM given   .  Tolerated well.  Bandaid applied.

## 2020-08-17 NOTE — Patient Instructions (Signed)
Acute/Emergency: Rizatriptan: Please take one tablet at the onset of your headache. If it does not improve the symptoms please take one additional tablet. Do not take more then 2 tablets in 24hrs. Do not take use more then 2 to 3 times in a week.  Prevention: Ajovy monthly  Nausea: Let me know what you have  Fremanezumab injection What is this medication? FREMANEZUMAB (fre ma NEZ ue mab) is used to prevent migraine headaches. This medicine may be used for other purposes; ask your health care provider orpharmacist if you have questions. COMMON BRAND NAME(S): AJOVY What should I tell my care team before I take this medication? They need to know if you have any of these conditions: an unusual or allergic reaction to fremanezumab, other medicines, foods, dyes, or preservatives pregnant or trying to get pregnant breast-feeding How should I use this medication? This medicine is for injection under the skin. You will be taught how to prepare and give this medicine. Use exactly as directed. Take your medicine atregular intervals. Do not take your medicine more often than directed. It is important that you put your used needles and syringes in a special sharps container. Do not put them in a trash can. If you do not have a sharpscontainer, call your pharmacist or healthcare provider to get one. Talk to your pediatrician regarding the use of this medicine in children.Special care may be needed. Overdosage: If you think you have taken too much of this medicine contact apoison control center or emergency room at once. NOTE: This medicine is only for you. Do not share this medicine with others. What if I miss a dose? If you miss a dose, take it as soon as you can. If it is almost time for yournext dose, take only that dose. Do not take double or extra doses. What may interact with this medication? Interactions are not expected. This list may not describe all possible interactions. Give your health care  provider a list of all the medicines, herbs, non-prescription drugs, or dietary supplements you use. Also tell them if you smoke, drink alcohol, or use illegaldrugs. Some items may interact with your medicine. What should I watch for while using this medication? Tell your doctor or healthcare professional if your symptoms do not start toget better or if they get worse. What side effects may I notice from receiving this medication? Side effects that you should report to your doctor or health care professionalas soon as possible: allergic reactions like skin rash, itching or hives, swelling of the face, lips, or tongue Side effects that usually do not require medical attention (report these toyour doctor or health care professional if they continue or are bothersome): pain, redness, or irritation at site where injected This list may not describe all possible side effects. Call your doctor for medical advice about side effects. You may report side effects to FDA at1-800-FDA-1088. Where should I keep my medication? Keep out of the reach of children. You will be instructed on how to store this medicine. Throw away any unusedmedicine after the expiration date on the label. NOTE: This sheet is a summary. It may not cover all possible information. If you have questions about this medicine, talk to your doctor, pharmacist, orhealth care provider.  2022 Elsevier/Gold Standard (2016-10-07 17:22:56)   Rizatriptan Disintegrating Tablets What is this medication? RIZATRIPTAN (rye za TRIP tan) treats migraines. It works by blocking pain signals and narrowing blood vessels in the brain. It belongs to a group ofmedications called triptans.  It is not used to prevent migraines. This medicine may be used for other purposes; ask your health care provider orpharmacist if you have questions. COMMON BRAND NAME(S): Maxalt-MLT What should I tell my care team before I take this medication? They need to know if you have  any of these conditions: Cigarette smoker Circulation problems in fingers and toes Diabetes Heart disease High blood pressure High cholesterol History of irregular heartbeat History of stroke Kidney disease Liver disease Stomach or intestine problems An unusual or allergic reaction to rizatriptan, other medications, foods, dyes, or preservatives Pregnant or trying to get pregnant Breast-feeding How should I use this medication? Take this medication by mouth. Follow the directions on the prescription label. Leave the tablet in the sealed blister pack until you are ready to take it. With dry hands, open the blister and gently remove the tablet. If the tablet breaks or crumbles, throw it away and take a new tablet out of the blister pack. Place the tablet in the mouth and allow it to dissolve, and then swallow. Do not cut, crush, or chew this medication. You do not need water to take thismedication. Do not take it more often than directed. Talk to your care team regarding the use of this medication in children. While this medication may be prescribed for children as young as 6 years for selectedconditions, precautions do apply. Overdosage: If you think you have taken too much of this medicine contact apoison control center or emergency room at once. NOTE: This medicine is only for you. Do not share this medicine with others. What if I miss a dose? This does not apply. This medication is not for regular use. What may interact with this medication? Do not take this medication with any of the following medications: Certain medications for migraine headache like almotriptan, eletriptan, frovatriptan, naratriptan, rizatriptan, sumatriptan, zolmitriptan Ergot alkaloids like dihydroergotamine, ergonovine, ergotamine, methylergonovine MAOIs like Carbex, Eldepryl, Marplan, Nardil, and Parnate This medication may also interact with the following medications: Certain medications for depression,  anxiety, or psychotic disorders Propranolol This list may not describe all possible interactions. Give your health care provider a list of all the medicines, herbs, non-prescription drugs, or dietary supplements you use. Also tell them if you smoke, drink alcohol, or use illegaldrugs. Some items may interact with your medicine. What should I watch for while using this medication? Visit your care team for regular checks on your progress. Tell your care teamif your symptoms do not start to get better or if they get worse. You may get drowsy or dizzy. Do not drive, use machinery, or do anything that needs mental alertness until you know how this medication affects you. Do not stand up or sit up quickly, especially if you are an older patient. This reduces the risk of dizzy or fainting spells. Alcohol may interfere with theeffect of this medication. Your mouth may get dry. Chewing sugarless gum or sucking hard candy and drinking plenty of water may help. Contact your care team if the problem doesnot go away or is severe. If you take migraine medications for 10 or more days a month, your migraines may get worse. Keep a diary of headache days and medication use. Contact yourcare team if your migraine attacks occur more frequently. What side effects may I notice from receiving this medication? Side effects that you should report to your care team as soon as possible: Allergic reactions-skin rash, itching, hives, swelling of the face, lips, tongue, or throat Burning, pain, tingling, or  color changes in the legs or feet Heart attack-pain or tightness in the chest, shoulders, arms, or jaw, nausea, shortness of breath, cold or clammy skin, feeling faint or lightheaded Heart rhythm changes-fast or irregular heartbeat, dizziness, feeling faint or lightheaded, chest pain, trouble breathing Increase in blood pressure Irritability, confusion, fast or irregular heartbeat, muscle stiffness, twitching muscles, sweating,  high fever, seizure, chills, vomiting, diarrhea, which may be signs of serotonin syndrome Raynaud's-cool, numb, or painful fingers or toes that may change color from pale, to blue, to red Seizures Stroke-sudden numbness or weakness of the face, arm, or leg, trouble speaking, confusion, trouble walking, loss of balance or coordination, dizziness, severe headache, change in vision Sudden or severe stomach pain, nausea, vomiting, fever, or bloody diarrhea Vision loss Side effects that usually do not require medical attention (report to your careteam if they continue or are bothersome): Dizziness General discomfort or fatigue This list may not describe all possible side effects. Call your doctor for medical advice about side effects. You may report side effects to FDA at1-800-FDA-1088. Where should I keep my medication? Keep out of the reach of children and pets. Store at room temperature between 15 and 30 degrees C (59 and 86 degrees F). Protect from light and moisture. Throw away any unused medication after theexpiration date. NOTE: This sheet is a summary. It may not cover all possible information. If you have questions about this medicine, talk to your doctor, pharmacist, orhealth care provider.  2022 Elsevier/Gold Standard (2020-02-02 16:19:19)

## 2020-08-19 ENCOUNTER — Encounter: Payer: Self-pay | Admitting: Neurology

## 2020-09-01 ENCOUNTER — Ambulatory Visit: Payer: 59 | Admitting: Internal Medicine

## 2020-09-04 ENCOUNTER — Other Ambulatory Visit: Payer: Self-pay | Admitting: *Deleted

## 2020-09-04 MED ORDER — ONDANSETRON HCL 4 MG PO TABS: 4.0000 mg | ORAL_TABLET | Freq: Four times a day (QID) | ORAL | 11 refills | Status: DC | PRN

## 2020-09-04 NOTE — Progress Notes (Signed)
Pt has tried phenergan in past and it didn't work. Per Dr Lucia Gaskins, order Zofran 4 mg PO q6h prn #20, 11 refills.

## 2020-11-27 ENCOUNTER — Ambulatory Visit: Payer: 59 | Admitting: Internal Medicine

## 2020-11-29 ENCOUNTER — Encounter: Payer: Self-pay | Admitting: Internal Medicine

## 2020-12-20 ENCOUNTER — Telehealth: Payer: Self-pay | Admitting: Neurology

## 2020-12-20 NOTE — Progress Notes (Addendum)
[facility]    Provider:  Dr Lucia Gaskins Requesting Provider: Elfredia Nevins, MD Primary Care Provider:  Elfredia Nevins, MD  CC:  Migraines  04/04/2021: Doing better. 6 migraine days a month and <14 total headache days. Will prescribe Nurtec. Episodic migraines.  12/21/2020: Here for follow up of migraines. The Ajovy did not work for her. She di dnot contact us since her last appointment to give [place] an update. No significant neck pain. can be unilateral, then becomes pounding, pulsating, throbbing, photophobia/phonophobia, nausea, her ears pop, she is getting the migraines sometimes up to 3 days and they are severe, no aura  HPI:  Felicia Greer is a 60 y.o. female here as requested by Elfredia Nevins, MD for Migraines.    She has pain and starts in the back of her neck, it is so tight and then radiates to the whole head, can be unilateral, then becomes pounding, pulsating, throbbing, photophobia/phonophobia, nausea, her ears pop, she is getting the migraines sometimes up to 3 days and they are severe, no aura, she had an episode of numbness on her chin, 15 headache days a month of which 8 are migraine days a month.   Tylenol, amlodipine, asa, celexa,lexapro, ibuprofen, metoprolol, naproxen, nortriptyline, prednisone, phenergan, imitrex, tizanidine, effexor,topiramate, ajovy did not work and gave her a welt(samples),   Reviewed notes, labs and imaging from outside physicians, which showed:  MRI brain 11/2018: personally reviewed images and agree IMPRESSION: 1. No acute intracranial abnormality. 2. Periventricular and subcortical T2 hyperintensities bilaterally are mildly advanced for age. The finding is nonspecific but can be seen in the setting of chronic microvascular ischemia, a demyelinating process such as multiple sclerosis, vasculitis, complicated migraine headaches, or as the sequelae of a prior infectious or inflammatory process.   03/04/19 TSH nml, ck nml, B12  182  Review of Systems: Patient complains of symptoms per HPI as well as the following symptoms: insomnia,stress . Pertinent negatives and positives per HPI. All others negative    Social History   Socioeconomic History   Marital status: Married    Spouse name: Not on file   Number of children: Not on file   Years of education: Not on file   Highest education level: Not on file  Occupational History   Not on file  Tobacco Use   Smoking status: Some Days    Packs/day: 0.00    Years: 38.00    Pack years: 0.00    Types: E-cigarettes, Cigarettes    Start date: 10/25/1972    Last attempt to quit: 12/21/2010    Years since quitting: 10.0   Smokeless tobacco: Never   Tobacco comments:    use electronic cigs  Vaping Use   Vaping Use: Some days   Substances: Nicotine, Flavoring  Substance and Sexual Activity   Alcohol use: No    Alcohol/week: 0.0 standard drinks   Drug use: No   Sexual activity: Not Currently    Partners: Male    Birth control/protection: Surgical, Post-menopausal    Comment: ablation and tubal  Other Topics Concern   Not on file  Social History Narrative   Lives with husband in a one story home with a basement.  Has 3 children.  Education: 10th grade.  Stays at home watching her grandkids.   Right handed   Caffeine: half caffeine in coffee in the morning, otherwise everything caffeine free    Social Determinants of Health   Financial Resource Strain: Not on file  Food Insecurity: Not on  file  Transportation Needs: Not on file  Physical Activity: Not on file  Stress: Not on file  Social Connections: Not on file  Intimate Partner Violence: Not on file    Family History  Problem Relation Age of Onset   Fibromyalgia Mother    Other Mother        sciatic nerve; bad shoulder   Colon cancer Mother    Other Father        collapsed lung   COPD Father    Heart attack Father    Other Sister        valves replaced in heart   Heart attack Sister     Anxiety disorder Brother    Other Brother        takes med for heart   Cancer Maternal Aunt        cervical    Migraines Maternal Aunt    Breast cancer Maternal Aunt    Breast cancer Maternal Grandmother    Emphysema Maternal Grandfather    Heart attack Paternal Grandmother    Anxiety disorder Daughter    Breast cancer Other    Migraines Other    Cancer Other        breast    Past Medical History:  Diagnosis Date   Acid reflux    Arthritis    pelvic bone   Back pain    BV (bacterial vaginosis) 4 18 2012   COPD (chronic obstructive pulmonary disease) (HCC) 2010   Dyspareunia, female 01/05/2015   Hx of goiter    Hx: UTI (urinary tract infection)    Hypertension    Osteopenia determined by x-ray 03/10/2014   Pelvic pain in female 03/01/2014   Rhinitis 02/15/2014   Urinary frequency 01/05/2015   Vaginal odor 01/05/2015    Patient Active Problem List   Diagnosis Date Noted   Facial numbness 12/01/2018   Screening for colorectal cancer 10/27/2017   Encounter for gynecological examination with Papanicolaou smear of cervix 10/27/2017   Constipation 09/08/2017   Rectocele 09/08/2017   Encounter for well woman exam with routine gynecological exam 10/03/2016   Vaginal atrophy 10/03/2016   Vaginal odor 01/05/2015   Urinary frequency 01/05/2015   Nocturia more than twice per night 01/05/2015   Dyspareunia, female 01/05/2015   Osteopenia determined by x-ray 03/10/2014   Pelvic pain in female 03/01/2014   Back pain 03/01/2014   Depression 02/20/2014   Rhinitis 02/15/2014   Insomnia 09/19/2013   COPD exacerbation (HCC) 04/23/2012   Chronic anxiety 04/23/2012   Vaginal discharge 04/07/2012   Acid reflux 04/04/2012   Hx: UTI (urinary tract infection) 04/04/2012   Thyroid goiter 04/04/2012   BV (bacterial vaginosis) 04/04/2012    Past Surgical History:  Procedure Laterality Date   BIOPSY THYROID N/A 06/2009   had fna   BREAST BIOPSY Right in the 1990s   BREAST BIOPSY   1980   benign   CERVICAL FUSION N/A in the 1990s   HYSTEROSCOPY W/ ENDOMETRIAL ABLATION N/A 2004   TUBAL LIGATION      Current Outpatient Medications  Medication Sig Dispense Refill   ALPRAZolam (XANAX) 1 MG tablet Take 1 tablet by mouth at bedtime. *May take 1 tablet three times daily as needed for anxiety  0   aspirin 325 MG tablet Take 1 tablet (325 mg total) by mouth daily.     Erenumab-aooe (AIMOVIG) 140 MG/ML SOAJ Inject 140 mg into the skin every 3 (three) months. 1.12 mL 6   Fremanezumab-vfrm (  AJOVY) 225 MG/1.5ML SOAJ Inject 225 mg into the skin every 30 (thirty) days. 6 mL 0   HYDROcodone-acetaminophen (NORCO/VICODIN) 5-325 MG tablet Take 1 tablet by mouth at bedtime. *May take up to three times daily as needed for pain  0   nortriptyline (PAMELOR) 50 MG capsule Take 50 mg by mouth at bedtime.     ondansetron (ZOFRAN) 4 MG tablet Take 1 tablet (4 mg total) by mouth every 6 (six) hours as needed for nausea or vomiting (migraine). 20 tablet 11   oxybutynin (DITROPAN) 5 MG tablet Take 5 mg by mouth at bedtime.     Rimegepant Sulfate (NURTEC) 75 MG TBDP Take 75 mg by mouth daily as needed. For migraines. Take as close to onset of migraine as possible. One daily maximum. 10 tablet 0   rizatriptan (MAXALT-MLT) 10 MG disintegrating tablet Take 1 tablet (10 mg total) by mouth as needed for migraine. May repeat in 2 hours if needed 9 tablet 11   venlafaxine (EFFEXOR) 75 MG tablet Take 75 mg by mouth 2 (two) times daily.     zolpidem (AMBIEN) 10 MG tablet Take 10 mg by mouth at bedtime as needed.     No current facility-administered medications for this visit.    Allergies as of 12/21/2020 - Review Complete 12/21/2020  Allergen Reaction Noted   Codeine Nausea Only 10/07/2011   Metrogel [metronidazole]  04/04/2012   Protonix [pantoprazole sodium] Rash 02/13/2013    Vitals: BP 125/75   Pulse 93   Ht 5\' 4"  (1.626 m)   Wt 114 lb (51.7 kg)   BMI 19.57 kg/m  Last Weight:  Wt Readings  from Last 1 Encounters:  12/21/20 114 lb (51.7 kg)   Last Height:   Ht Readings from Last 1 Encounters:  12/21/20 5\' 4"  (1.626 m)   Exam: NAD, pleasant                  Speech:    Speech is normal; fluent and spontaneous with normal comprehension.  Cognition:    The patient is oriented to person, place, and time;     recent and remote memory intact;     language fluent;    Cranial Nerves:    The pupils are equal, round, and reactive to light.Trigeminal sensation is intact and the muscles of mastication are normal. The face is symmetric. The palate elevates in the midline. Hearing intact. Voice is normal. Shoulder shrug is normal. The tongue has normal motion without fasciculations.   Coordination:  No dysmetria  Motor Observation:    No asymmetry, no atrophy, and no involuntary movements noted. Tone:    Normal muscle tone.     Strength:    Strength is V/V in the upper and lower limbs.      Sensation: intact to LT     Assessment/Plan:  60 year old with chronic migraines, failed multiple medications  04/04/2021: Doing better. 6 migraine days a month and <14 total headache days a month. Will prescribe Nurtec. Episodic migraines.  Acute/Emergency: Rizatriptan: Still likes the rizatriptan but runs out. Gave her samples of nurtec. Next can try ubrelvy.   Prevention: Ajovy did not work and gave her a 67. Try Aimovig. Then can try wither qulipta or if the nurtec works well we can try it every other day.  Nausea: Let me know what you have tried in the past (she thinks maybe she tried zofran and it did not work) and we can prescribe something else to  try. She never got in touch with [place], try above  Spoke to her and her daughter-in-lae nicole, please mychart [place] inbetween and let us know how things are going don't feel like you need to wait 4 months for your follow up.  B12: Encouraged her to supplement, was 182 in the past, discussed sequelae. B12 daily po long term and  needs to follow with pcp for rechecks; B12 deficiency can cause weakness, fatigue, easy bruising or bleeding,sore tongue, stomach upset, weight loss, and diarrhea or constipation, tingling or numbness to the fingers and toes, difficulty walking, mood changes, depression, memory loss, disorientation and, in severe cases, dementia. Recommend 1000-2000 mcg B12 daily. recheck B12 in 8-12 weeks with pcp. She has not been compliant.    Meds ordered this encounter  Medications   Erenumab-aooe (AIMOVIG) 140 MG/ML SOAJ    Sig: Inject 140 mg into the skin every 3 (three) months.    Dispense:  1.12 mL    Refill:  6   Rimegepant Sulfate (NURTEC) 75 MG TBDP    Sig: Take 75 mg by mouth daily as needed. For migraines. Take as close to onset of migraine as possible. One daily maximum.    Dispense:  10 tablet    Refill:  0    To prevent or relieve headaches, try the following: Cool Compress. Lie down and place a cool compress on your head.  Avoid headache triggers. If certain foods or odors seem to have triggered your migraines in the past, avoid them. A headache diary might help you identify triggers.  Include physical activity in your daily routine. Try a daily walk or other moderate aerobic exercise.  Manage stress. Find healthy ways to cope with the stressors, such as delegating tasks on your to-do list.  Practice relaxation techniques. Try deep breathing, yoga, massage and visualization.  Eat regularly. Eating regularly scheduled meals and maintaining a healthy diet might help prevent headaches. Also, drink plenty of fluids.  Follow a regular sleep schedule. Sleep deprivation might contribute to headaches Consider biofeedback. With this mind-body technique, you learn to control certain bodily functions -- such as muscle tension, heart rate and blood pressure -- to prevent headaches or reduce headache pain.    Proceed to emergency room if you experience new or worsening symptoms or symptoms do not  resolve, if you have new neurologic symptoms or if headache is severe, or for any concerning symptom.   Provided education and documentation from American headache Society toolbox including articles on: chronic migraine medication overuse headache, chronic migraines, prevention of migraines, behavioral and other nonpharmacologic treatments for headache.   Cc: Elfredia Nevins, MD,  Elfredia Nevins, MD  Naomie Dean, MD  [facility] [address] [place], [place] [address]  Phone [phone] Fax [phone]  I spent over 30 minutes of face-to-face and non-face-to-face time with patient on the  1. Chronic migraine without aura without status migrainosus, not intractable    diagnosis.  This included previsit chart review, lab review, study review, order entry, electronic health record documentation, patient education on the different diagnostic and therapeutic options, counseling and coordination of care, risks and benefits of management, compliance, or risk factor reduction

## 2020-12-20 NOTE — Telephone Encounter (Signed)
Do one of you mind calling to see if she wants to do her appointment by video tomorrow? May be easier and the flu is going around

## 2020-12-20 NOTE — Telephone Encounter (Signed)
LVM for patient offering a VV. I said to call us back if she would like it switched, if not will we see her checking in at 1:30pm.

## 2020-12-21 ENCOUNTER — Encounter: Payer: Self-pay | Admitting: Neurology

## 2020-12-21 ENCOUNTER — Ambulatory Visit: Payer: 59 | Admitting: Neurology

## 2020-12-21 VITALS — BP 125/75 | HR 93 | Ht 64.0 in | Wt 114.0 lb

## 2020-12-21 DIAGNOSIS — G43709 Chronic migraine without aura, not intractable, without status migrainosus: Secondary | ICD-10-CM | POA: Diagnosis not present

## 2020-12-21 DIAGNOSIS — G43009 Migraine without aura, not intractable, without status migrainosus: Secondary | ICD-10-CM

## 2020-12-21 MED ORDER — NURTEC 75 MG PO TBDP: 75.0000 mg | ORAL_TABLET | Freq: Every day | ORAL | 0 refills | Status: DC | PRN

## 2020-12-21 MED ORDER — AIMOVIG 140 MG/ML ~~LOC~~ SOAJ: 140.0000 mg | SUBCUTANEOUS | 6 refills | Status: DC

## 2020-12-21 NOTE — Patient Instructions (Addendum)
Preventative: Aimovig monthly Acute/rescue/emergent: rizatriptan(can repeat in 2 hours) and can also try the nurtec once daily as needed  Message me in a few months and let know if Aimovig is working. Also let me know if the nurtec is working. The next steps may be Qulipta.  Atogepant tablets What is this medication? ATOGEPANT (a TOE je pant) is used to prevent migraine headaches. This medicine may be used for other purposes; ask your health care provider or pharmacist if you have questions. COMMON BRAND NAME(S): QULIPTA What should I tell my care team before I take this medication? They need to know if you have any of these conditions: kidney disease liver disease an unusual or allergic reaction to atogepant, other medicines, foods, dyes, or preservatives pregnant or trying to get pregnant breast-feeding How should I use this medication? Take this medicine by mouth with water. Take it as directed on the prescription label at the same time every day. You can take it with or without food. If it upsets your stomach, take it with food. Keep taking it unless your health care provider tells you to stop. Talk to your health care provider about the use of this medicine in children. Special care may be needed. Overdosage: If you think you have taken too much of this medicine contact a poison control center or emergency room at once. NOTE: This medicine is only for you. Do not share this medicine with others. What if I miss a dose? If you miss a dose, take it as soon as you can. If it is almost time for your next dose, take only that dose. Do not take double or extra doses. What may interact with this medication? carbamazepine certain medicines for fungal infections like itraconazole, ketoconazole clarithromycin cyclosporine efavirenz etravirine phenytoin rifampin [facility] John's Wort This list may not describe all possible interactions. Give your health care provider a list of all the medicines,  herbs, non-prescription drugs, or dietary supplements you use. Also tell them if you smoke, drink alcohol, or use illegal drugs. Some items may interact with your medicine. What should I watch for while using this medication? Visit your health care provider for regular checks on your progress. Tell your health care provider if your symptoms do not start to get better or if they get worse. What side effects may I notice from receiving this medication? Side effects that you should report to your doctor or health care provider as soon as possible: allergic reactions (skin rash, itching or hives; swelling of the face, lips, tongue) light-colored stool liver injury (dark yellow or brown urine; general ill feeling or flu-like symptoms; loss of appetite, right upper belly pain; unusually weak or tired, yellowing of the eyes or skin) Side effects that usually do not require medical attention (report these to your doctor or health care provider if they continue or are bothersome): constipation lack or loss of appetite nausea unusually weak or tired weight loss This list may not describe all possible side effects. Call your doctor for medical advice about side effects. You may report side effects to FDA at 1-800-FDA-1088. Where should I keep my medication? Keep out of the reach of children and pets. Store at room temperature between 20 and 25 degrees C (68 and 77 degrees F). Get rid of any unused medicine after the expiration date. To get rid of medicines that are no longer needed or have expired: Take the medicine to a medicine take-back program. Check with your pharmacy or law enforcement to  find a location. If you cannot return the medicine, check the label or package insert to see if the medicine should be thrown out in the garbage or flushed down the toilet. If you are not sure, ask your health care provider. If it is safe to put it in the trash, take the medicine out of the container. Mix the  medicine with cat litter, dirt, coffee grounds, or other unwanted substance. Seal the mixture in a bag or container. Put it in the trash. NOTE: This sheet is a summary. It may not cover all possible information. If you have questions about this medicine, talk to your doctor, pharmacist, or health care provider.  2022 Elsevier/Gold Standard (2019-10-25 00:00:00)   Erenumab injection What is this medication? ERENUMAB (e REN ue mab) is used to prevent migraine headaches. This medicine may be used for other purposes; ask your health care provider or pharmacist if you have questions. COMMON BRAND NAME(S): Aimovig What should I tell my care team before I take this medication? They need to know if you have any of these conditions: an unusual or allergic reaction to erenumab, latex, other medicines, foods, dyes, or preservatives high blood pressure pregnant or trying to get pregnant breast-feeding How should I use this medication? This medicine is for injection under the skin. You will be taught how to prepare and give this medicine. Use exactly as directed. Take your medicine at regular intervals. Do not take your medicine more often than directed. It is important that you put your used needles and syringes in a special sharps container. Do not put them in a trash can. If you do not have a sharps container, call your pharmacist or healthcare provider to get one. Talk to your pediatrician regarding the use of this medicine in children. Special care may be needed. Overdosage: If you think you have taken too much of this medicine contact a poison control center or emergency room at once. NOTE: This medicine is only for you. Do not share this medicine with others. What if I miss a dose? If you miss a dose, take it as soon as you can. If it is almost time for your next dose, take only that dose. Do not take double or extra doses. What may interact with this medication? Interactions are not  expected. This list may not describe all possible interactions. Give your health care provider a list of all the medicines, herbs, non-prescription drugs, or dietary supplements you use. Also tell them if you smoke, drink alcohol, or use illegal drugs. Some items may interact with your medicine. What should I watch for while using this medication? Tell your doctor or healthcare professional if your symptoms do not start to get better or if they get worse. What side effects may I notice from receiving this medication? Side effects that you should report to your doctor or health care professional as soon as possible: allergic reactions like skin rash, itching or hives, swelling of the face, lips, or tongue chest pain fast, irregular heartbeat feeling faint or lightheaded palpitations Side effects that usually do not require medical attention (report these to your doctor or health care professional if they continue or are bothersome): constipation muscle cramps pain, redness, or irritation at site where injected This list may not describe all possible side effects. Call your doctor for medical advice about side effects. You may report side effects to FDA at 1-800-FDA-1088. Where should I keep my medication? Keep out of the reach of children. You  will be instructed on how to store this medicine. Throw away any unused medicine after the expiration date on the label. NOTE: This sheet is a summary. It may not cover all possible information. If you have questions about this medicine, talk to your doctor, pharmacist, or health care provider.  2022 Elsevier/Gold Standard (2018-05-26 00:00:00)

## 2020-12-25 ENCOUNTER — Telehealth: Payer: Self-pay

## 2020-12-25 NOTE — Telephone Encounter (Signed)
PA for aimovig was approved via [employer]. "The request has been approved. The authorization is effective for a maximum of 6 fills from 12/25/2020 to 06/24/2021, as long as the member is enrolled in their current health plan. The request was approved with a quantity restriction. This has been approved for a quantity limit of 1 with a day supply limit of 30. A written notification letter will follow with additional details."

## 2020-12-25 NOTE — Telephone Encounter (Signed)
Received PA request for aimovig. Completed via CMM. Sent to [employer]. Key: [id number]. Should have a determination within 3-5 business days.

## 2021-02-27 ENCOUNTER — Telehealth: Payer: Self-pay | Admitting: Orthopedic Surgery
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
## 2021-03-20 ENCOUNTER — Ambulatory Visit: Payer: 59 | Admitting: Orthopedic Surgery

## 2021-03-23 ENCOUNTER — Encounter: Payer: Self-pay | Admitting: Orthopedic Surgery

## 2021-03-23 ENCOUNTER — Ambulatory Visit: Payer: 59 | Admitting: Orthopedic Surgery

## 2021-03-23 ENCOUNTER — Ambulatory Visit (INDEPENDENT_AMBULATORY_CARE_PROVIDER_SITE_OTHER): Payer: 59

## 2021-03-23 ENCOUNTER — Telehealth: Payer: Self-pay | Admitting: Orthopedic Surgery

## 2021-03-23 ENCOUNTER — Other Ambulatory Visit: Payer: Self-pay

## 2021-03-23 VITALS — BP 123/85 | HR 92 | Ht 64.0 in | Wt 114.0 lb

## 2021-03-23 DIAGNOSIS — M25522 Pain in left elbow: Secondary | ICD-10-CM

## 2021-03-23 NOTE — Telephone Encounter (Signed)
Patient called advised she had some paperwork for Dr. Tempie Donning and would like to have the information back. Patient said said it ws a CD and paperwork from the hospital.  Patient asked if the information can be mailed to her? The number to contact patient is [phone] ?
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
## 2021-03-26 NOTE — Telephone Encounter (Signed)
Mailed back to patient.

## 2021-03-29 ENCOUNTER — Other Ambulatory Visit: Payer: Self-pay | Admitting: *Deleted

## 2021-03-29 ENCOUNTER — Telehealth: Payer: Self-pay | Admitting: *Deleted

## 2021-03-29 NOTE — Telephone Encounter (Signed)
Lorelei [facility] (Key: [id number]) ?Rx #: [id number] ?Aimovig 140MG /ML auto-injectors ? ?Waiting on approval  ?

## 2021-03-29 NOTE — Telephone Encounter (Signed)
Received fax from [employer]. Aimovig approved from 03/29/21 to 09/29/21. Faxed approval notice to pharmacy. Received a receipt of confirmation. ? ?

## 2021-04-04 ENCOUNTER — Other Ambulatory Visit: Payer: Self-pay | Admitting: Neurology

## 2021-04-04 MED ORDER — NURTEC 75 MG PO TBDP: 75.0000 mg | ORAL_TABLET | Freq: Every day | ORAL | 6 refills | Status: DC | PRN

## 2021-04-05 ENCOUNTER — Telehealth: Payer: Self-pay | Admitting: *Deleted

## 2021-04-05 NOTE — Telephone Encounter (Signed)
Nurtec PA completed on Cover My Meds. Key: [id number]. Awaiting determination from [employer] [facility].  ?

## 2021-04-06 ENCOUNTER — Other Ambulatory Visit: Payer: Self-pay | Admitting: Psychiatry

## 2021-04-06 MED ORDER — METHYLPREDNISOLONE 4 MG PO TBPK: ORAL_TABLET | ORAL | 0 refills | Status: DC

## 2021-04-06 NOTE — Progress Notes (Signed)
Received call that patient has had a migraine for the past 3 days. She is currently waiting for insurance approval for Nurtec. Will send in medrol dosepak to help break current migraine while awaiting Nurtec approval. Advised her to take Zofran as needed for nausea/vomiting. All questions answered. ? ?Genia Harold ?04/06/21 ?2:21 PM ? ?

## 2021-04-09 NOTE — Telephone Encounter (Addendum)
I spoke with the patient.  We discussed that insurance will not let her be on Nurtec and rizatriptan at the same time.  Patient states she has found the Nurtec to be more helpful than the Rizatriptan.  If she has to choose between the 2, she would choose the Nurtec.  We discussed that we could send in an urgent appeal request and discontinue the rizatriptan to see if insurance will overturn the denial.  Worst case if appeal is denied, we can discontinue Nurtec and reorder rizatriptan as patient would be okay with that as well. She verbalized appreciation for the call and her questions were answered.  ? ?I called pt's insurance, [employer], spoke with Simonne Come, and completed urgent oral appeal for Nurtec. Case ID [id number]. Advised that Rizatriptan is now being discontinued. Anticipate determination within 9-72 hours.  ?

## 2021-04-09 NOTE — Addendum Note (Signed)
Addended by: Bertram Savin on: 04/09/2021 04:46 PM ? ? Modules accepted: Orders ? ?

## 2021-04-09 NOTE — Telephone Encounter (Addendum)
Received fax from [employer]. Essentially patient cannot take Nurtec and rizatriptan at the same time. If appeal is requested, fax to [phone]. Request ID [id number]. Can also do oral appeal by calling [phone].  ? ?Denied on March 17 ?PA Case: [id number], Status: Denied, Denial Rationale: Your medication request has been denied as it does not appear to meet medically necessary requirements. Plan rules require clinical parameters (diagnosis, lab values, test results, physical exam findings etc) be met for medical necessity approval. Information submitted does not indicate required parameter results were met. Coverage for Nurtec 75MG Tablet Disintegrating is denied. It does not meet medical necessity. Nurtec 75MG Tablet Disintegrating has been requested for the treatment of acute migraine. The information provided by your prescriber does not meet [employer]'s guideline. The guideline used is: Calcitonin Gene-Related Peptide (CGRP). Information provided does not show that the policy requirements have been met. The requirement(s) not met are: ALL of the following: -confirmation that the requested agent will NOT be used in combination with another acute migraine agent therapy (i.e., triptan, 5HT-26F, ergotamine, acute use CGRP) for the requested indication; AND -confirmation that medication overuse headache has been ruled out; AND -the absence of any FDA labeled contraindications to the requested agent. Therefore, coverage for Nurtec 75MG Tablet Disintegrating is denied. Questions? Contact [phone]. ?

## 2021-04-11 NOTE — Telephone Encounter (Signed)
Great! Notified pt.  ?

## 2021-04-11 NOTE — Telephone Encounter (Signed)
Update: spoke with pharmacy. They got a paid claim, 16 tablets for $20. They will fill for the pt.  ?

## 2021-04-11 NOTE — Telephone Encounter (Signed)
Tren called with the determination of Approval for the pt's Nurtec from dates 04/09/21-04/10/22 ?

## 2021-04-23 ENCOUNTER — Telehealth (INDEPENDENT_AMBULATORY_CARE_PROVIDER_SITE_OTHER): Payer: Self-pay | Admitting: Neurology

## 2021-04-23 DIAGNOSIS — Z91199 Patient's noncompliance with other medical treatment and regimen due to unspecified reason: Secondary | ICD-10-CM

## 2021-04-23 NOTE — Progress Notes (Signed)
NO SHOW FOR APPOINTMENT

## 2021-04-26 ENCOUNTER — Encounter: Payer: Self-pay | Admitting: Neurology

## 2021-08-08 ENCOUNTER — Encounter: Payer: Self-pay | Admitting: Neurology

## 2021-08-08 ENCOUNTER — Telehealth: Payer: Self-pay | Admitting: *Deleted

## 2021-08-08 ENCOUNTER — Ambulatory Visit: Payer: 59 | Admitting: Neurology

## 2021-08-08 VITALS — BP 133/81 | HR 68 | Ht 63.0 in | Wt 112.8 lb

## 2021-08-08 DIAGNOSIS — M5412 Radiculopathy, cervical region: Secondary | ICD-10-CM | POA: Diagnosis not present

## 2021-08-08 DIAGNOSIS — G8929 Other chronic pain: Secondary | ICD-10-CM

## 2021-08-08 DIAGNOSIS — M7918 Myalgia, other site: Secondary | ICD-10-CM

## 2021-08-08 DIAGNOSIS — R29898 Other symptoms and signs involving the musculoskeletal system: Secondary | ICD-10-CM | POA: Diagnosis not present

## 2021-08-08 DIAGNOSIS — R3989 Other symptoms and signs involving the genitourinary system: Secondary | ICD-10-CM

## 2021-08-08 DIAGNOSIS — S134XXA Sprain of ligaments of cervical spine, initial encounter: Secondary | ICD-10-CM

## 2021-08-08 DIAGNOSIS — R198 Other specified symptoms and signs involving the digestive system and abdomen: Secondary | ICD-10-CM

## 2021-08-08 DIAGNOSIS — M542 Cervicalgia: Secondary | ICD-10-CM

## 2021-08-08 DIAGNOSIS — R202 Paresthesia of skin: Secondary | ICD-10-CM

## 2021-08-08 DIAGNOSIS — S1980XA Other specified injuries of unspecified part of neck, initial encounter: Secondary | ICD-10-CM

## 2021-08-08 MED ORDER — TRAMADOL HCL 50 MG PO TABS: 50.0000 mg | ORAL_TABLET | Freq: Four times a day (QID) | ORAL | 1 refills | Status: DC | PRN

## 2021-08-08 MED ORDER — CYCLOBENZAPRINE HCL 10 MG PO TABS: 10.0000 mg | ORAL_TABLET | Freq: Three times a day (TID) | ORAL | 3 refills | Status: DC | PRN

## 2021-08-08 NOTE — Telephone Encounter (Signed)
Received call from Josh at [facility].  Pt is taking hydrocodone 10-325mg  tabs (one tablet 4 times daily) #120 tablets. Questioning still ok to prescribe.  I did not see on her drug list.

## 2021-08-08 NOTE — Telephone Encounter (Signed)
Consulted with Dr. Lucia Gaskins.  She was not aware that pt is on hydrocodone 10-325mg  tablets.  Cancelled Tramadol. I spoke to [place] and cancelled at the pharmacy.  He verbalized understanding.

## 2021-08-08 NOTE — Addendum Note (Signed)
Addended by: Naomie Dean B on: 08/08/2021 08:20 AM   Modules accepted: Orders
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
## 2021-08-08 NOTE — Patient Instructions (Addendum)
Physical Therapy: Cervical myofascial pain, whiplash,  cervicalgia. Please evaluate and treat including dry needling, stretching, strengthening, manual therapy/massage, heating, TENS unit, exercising for stabilization, pectoral stretching and rhomboid strengthening as clinically warranted as well as any other modality as recommended by evaluation. DRY NEEDLING may help tremendously. Send to [facility] for PT and evaluation. MRI cervical spine pensing will send over. [facility] will call.   Flexeril  Tramadol limited  MRI ASAP Cervical  Tramadol Tablets What is this medication? TRAMADOL (TRA ma dole) treats severe pain. It is prescribed when other pain medications have not worked or cannot be tolerated. It works by blocking pain signals in the brain. It belongs to a group of medications called opioids. This medicine may be used for other purposes; ask your health care provider or pharmacist if you have questions. COMMON BRAND NAME(S): Ultram What should I tell my care team before I take this medication? They need to know if you have any of these conditions: Brain tumor Drug abuse or addiction Head injury If you often drink alcohol Kidney disease Liver disease Lung disease, asthma, or breathing problems Seizures Stomach or intestine problems Suicidal thoughts, plans or attempt; a previous suicide attempt by you or a family member Taken an MAOI like Marplan, Nardil, or Parnate in the last 14 days An unusual or allergic reaction to tramadol, other medications, foods, dyes, or preservatives Pregnant or trying to get pregnant Breast-feeding How should I use this medication? Take this medication by mouth with a full glass of water. Follow the directions on the prescription label. You can take it with or without food. If it upsets your stomach, take it with food. Do not take your medication more often than directed. A special MedGuide will be given to you by the pharmacist  with each prescription and refill. Be sure to read this information carefully each time. Talk to your care team regarding the use of this medication in children. Special care may be needed. Overdosage: If you think you have taken too much of this medicine contact a poison control center or emergency room at once. NOTE: This medicine is only for you. Do not share this medicine with others. What if I miss a dose? If you miss a dose, take it as soon as you can. If it is almost time for your next dose, take only that dose. Do not take double or extra doses. What may interact with this medication? Do not take this medication with any of the following: Linezolid MAOIs like Marplan, Nardil, and Parnate Methylene blue Ozanimod This medication may also interact with the following: Alcohol Antihistamines for allergy, cough, and cold Atropine Certain antibiotics like erythromycin, clarithromycin, rifampin Certain antivirals for HIV or hepatitis Certain medications for anxiety or sleep Certain medications for bladder problems like oxybutynin, tolterodine Certain medications for depression like amitriptyline, bupropion, fluoxetine, paroxetine, sertraline Certain medications for fungal infections like ketoconazole, itraconazole, or posaconazole Certain medications for migraine headache like almotriptan, eletriptan, frovatriptan, naratriptan, rizatriptan, sumatriptan, zolmitriptan Certain medications for Parkinson's disease like benztropine, trihexyphenidyl Certain medications for seizures like carbamazepine, phenobarbital, primidone Certain medications for stomach problems like dicyclomine, hyoscyamine Certain medications for travel sickness like scopolamine Digoxin Diuretics General anesthetics like halothane, isoflurane, methoxyflurane, propofol Ipratropium Medications that relax muscles for surgery Other narcotic medications for pain Phenothiazines like chlorpromazine, mesoridazine,  prochlorperazine, thioridazine Quinidine Warfarin This list may not describe all possible interactions. Give your health care provider a list of all the medicines, herbs, non-prescription  drugs, or dietary supplements you use. Also tell them if you smoke, drink alcohol, or use illegal drugs. Some items may interact with your medicine. What should I watch for while using this medication? Tell your care team if your pain does not go away, if it gets worse, or if you have new or a different type of pain. You may develop tolerance to this medication. Tolerance means that you will need a higher dose of the medication for pain relief. Tolerance is normal and is expected if you take this medication for a long time. Do not suddenly stop taking your medication because you may develop a severe reaction. Your body becomes used to the medication. This does NOT mean you are addicted. Addiction is a behavior related to getting and using a medication for a nonmedical reason. If you have pain, you have a medical reason to take pain medication. Your care team will tell you how much medication to take. If your care team wants you to stop the medication, the dose will be slowly lowered over time to avoid any side effects. If you take other medications that also cause drowsiness like other narcotic pain medications, benzodiazepines, or other medications for sleep, you may have more side effects. Give your care team a list of all medications you use. He or she will tell you how much medication to take. Do not take more medication than directed. Call emergency services if you have trouble breathing or are unusually tired or sleepy. Talk to your care team about naloxone and how to get it. Naloxone is an emergency medication used for an opioid overdose. An overdose can happen if you take too much opioid. It can also happen if an opioid is taken with some other medications or substances, like alcohol. Know the symptoms of an  overdose, like trouble breathing, unusually tired or sleepy, or not being able to respond or wake up. Make sure to tell caregivers and close contacts where it is stored. Make sure they know how to use it. After naloxone is given, you must call emergency services. Naloxone is a temporary treatment. Repeat doses may be needed. This medication may cause serious skin reactions. They can happen weeks to months after starting the medication. Contact your care team right away if you notice fevers or flu-like symptoms with a rash. The rash may be red or purple and then turn into blisters or peeling of the skin. Or, you might notice a red rash with swelling of the face, lips, or lymph nodes in your neck or under your arms. You may get drowsy or dizzy. Do not drive, use machinery, or do anything that needs mental alertness until you know how this medication affects you. Do not stand up or sit up quickly, especially if you are an older patient. This reduces the risk of dizzy or fainting spells. Alcohol may interfere with the effect of this medication. Avoid alcoholic drinks. This medication will cause constipation. If you do not have a bowel movement for 3 days, call your care team. Your mouth may get dry. Chewing sugarless gum or sucking hard candy and drinking plenty of water may help. Contact your care team if the problem does not go away or is severe. What side effects may I notice from receiving this medication? Side effects that you should report to your care team as soon as possible: Allergic reactions--skin rash, itching, hives, swelling of the face, lips, tongue, or throat CNS depression--slow or shallow breathing, shortness of breath,  feeling faint, dizziness, confusion, difficulty staying awake Low adrenal gland function--nausea, vomiting, loss of appetite, unusual weakness or fatigue, dizziness Low blood pressure--dizziness, feeling faint or lightheaded, blurry vision Low blood sugar  (hypoglycemia)--tremors or shaking, anxiety, sweating, cold or clammy skin, confusion, dizziness, rapid heartbeat Low sodium level--muscle weakness, fatigue, dizziness, headache, confusion Redness, blistering, peeling, or loosening of the skin, including inside the mouth Seizures Side effects that usually do not require medical attention (report to your care team if they continue or are bothersome): Constipation Dizziness Drowsiness Dry mouth Headache Nausea Vomiting This list may not describe all possible side effects. Call your doctor for medical advice about side effects. You may report side effects to FDA at 1-800-FDA-1088. Where should I keep my medication? Keep out of the reach of children and pets. This medication can be abused. Keep it in a safe place to protect it from theft. Do not share it with anyone. It is only for you. Selling or giving away this medication is dangerous and against the law. Store at room temperature between 20 and 25 degrees C (68 and 77 degrees F). Get rid of any unused medication after the expiration date. This medication may cause harm and death if it is taken by other adults, children, or pets. It is important to get rid of the medication as soon as you no longer need it, or it is expired. You can do this in two ways: Take the medication to a medication take-back program. Check with your pharmacy or law enforcement to find a location. If you cannot return the medication, check the label or package insert to see if the medication should be thrown out in the garbage or flushed down the toilet. If you are not sure, ask your care team. If it is safe to put it in the trash, take the medication out of the container. Mix the medication with cat litter, dirt, coffee grounds, or other unwanted substance. Seal the mixture in a bag or container. Put it in the trash. NOTE: This sheet is a summary. It may not cover all possible information. If you have questions about this  medicine, talk to your doctor, pharmacist, or health care provider.  2023 Elsevier/Gold Standard (2020-12-08 00:00:00) Cyclobenzaprine Tablets What is this medication? CYCLOBENZAPRINE (sye kloe BEN za preen) treats muscle spasms. It works by relaxing your muscles, which reduces muscle stiffness. It belongs to a group of medications called muscle relaxants. This medicine may be used for other purposes; ask your health care provider or pharmacist if you have questions. COMMON BRAND NAME(S): Fexmid, Flexeril What should I tell my care team before I take this medication? They need to know if you have any of these conditions: Heart disease, irregular heartbeat, or previous heart attack Liver disease Thyroid problem An unusual or allergic reaction to cyclobenzaprine, tricyclic antidepressants, lactose, other medications, foods, dyes, or preservatives Pregnant or trying to get pregnant Breast-feeding How should I use this medication? Take this medication by mouth with a glass of water. Follow the directions on the prescription label. If this medication upsets your stomach, take it with food or milk. Take your medication at regular intervals. Do not take it more often than directed. Talk to your care team about the use of this medication in children. Special care may be needed. Overdosage: If you think you have taken too much of this medicine contact a poison control center or emergency room at once. NOTE: This medicine is only for you. Do not share this medicine  with others. What if I miss a dose? If you miss a dose, take it as soon as you can. If it is almost time for your next dose, take only that dose. Do not take double or extra doses. What may interact with this medication? Do not take this medication with any of the following: MAOIs like Carbex, Eldepryl, Marplan, Nardil, and Parnate Narcotic medications for cough Safinamide This medication may also interact with the  following: Alcohol Bupropion Antihistamines for allergy, cough and cold Certain medications for anxiety or sleep Certain medications for bladder problems like oxybutynin, tolterodine Certain medications for depression like amitriptyline, fluoxetine, sertraline Certain medications for Parkinson's disease like benztropine, trihexyphenidyl Certain medications for seizures like phenobarbital, primidone Certain medications for stomach problems like dicyclomine, hyoscyamine Certain medications for travel sickness like scopolamine General anesthetics like halothane, isoflurane, methoxyflurane, propofol Ipratropium Local anesthetics like lidocaine, pramoxine, tetracaine Medications that relax muscles for surgery Narcotic medications for pain Phenothiazines like chlorpromazine, mesoridazine, prochlorperazine, thioridazine Verapamil This list may not describe all possible interactions. Give your health care provider a list of all the medicines, herbs, non-prescription drugs, or dietary supplements you use. Also tell them if you smoke, drink alcohol, or use illegal drugs. Some items may interact with your medicine. What should I watch for while using this medication? Tell your care team if your symptoms do not start to get better or if they get worse. You may get drowsy or dizzy. Do not drive, use machinery, or do anything that needs mental alertness until you know how this medication affects you. Do not stand or sit up quickly, especially if you are an older patient. This reduces the risk of dizzy or fainting spells. Alcohol may interfere with the effect of this medication. Avoid alcoholic drinks. If you are taking another medication that also causes drowsiness, you may have more side effects. Give your care team a list of all medications you use. Your care team will tell you how much medication to take. Do not take more medication than directed. Call emergency for help if you have problems breathing or  unusual sleepiness. Your mouth may get dry. Chewing sugarless gum or sucking hard candy, and drinking plenty of water may help. Contact your care team if the problem does not go away or is severe. What side effects may I notice from receiving this medication? Side effects that you should report to your care team as soon as possible: Allergic reactions--skin rash, itching, hives, swelling of the face, lips, tongue, or throat CNS depression--slow or shallow breathing, shortness of breath, feeling faint, dizziness, confusion, trouble staying awake Heart rhythm changes--fast or irregular heartbeat, dizziness, feeling faint or lightheaded, chest pain, trouble breathing Side effects that usually do not require medical attention (report to your care team if they continue or are bothersome): Constipation Dizziness Drowsiness Dry mouth Fatigue Nausea This list may not describe all possible side effects. Call your doctor for medical advice about side effects. You may report side effects to FDA at 1-800-FDA-1088. Where should I keep my medication? Keep out of the reach of children. Store at room temperature between 15 and 30 degrees C (59 and 86 degrees F). Keep container tightly closed. Throw away any unused medication after the expiration date. NOTE: This sheet is a summary. It may not cover all possible information. If you have questions about this medicine, talk to your doctor, pharmacist, or health care provider.  2023 Elsevier/Gold Standard (2020-04-20 00:00:00)

## 2021-08-09 ENCOUNTER — Telehealth: Payer: Self-pay | Admitting: Neurology

## 2021-08-09 NOTE — Telephone Encounter (Signed)
30 mins MRI cervical spine wo contrast Dr. Lucia Gaskins Friday health plan auth: [id number] exp. 08/08/21-11/08/21 scheduled at [facility] 08/14/21 at 4:30pm

## 2021-08-09 NOTE — Telephone Encounter (Signed)
Pt is scheduled on 08/13/2021 @ 11:00 AM, for an initial consult.

## 2021-08-09 NOTE — Telephone Encounter (Signed)
Referral sent to [facility] [phone] 

## 2021-08-09 NOTE — Telephone Encounter (Signed)
Referral sent to [facility] for PT and evaluation. [phone]

## 2021-08-14 ENCOUNTER — Ambulatory Visit (INDEPENDENT_AMBULATORY_CARE_PROVIDER_SITE_OTHER): Payer: 59

## 2021-08-14 DIAGNOSIS — R3989 Other symptoms and signs involving the genitourinary system: Secondary | ICD-10-CM

## 2021-08-14 DIAGNOSIS — M5412 Radiculopathy, cervical region: Secondary | ICD-10-CM

## 2021-08-14 DIAGNOSIS — S1980XA Other specified injuries of unspecified part of neck, initial encounter: Secondary | ICD-10-CM | POA: Diagnosis not present

## 2021-08-14 DIAGNOSIS — M542 Cervicalgia: Secondary | ICD-10-CM | POA: Diagnosis not present

## 2021-08-14 DIAGNOSIS — R29898 Other symptoms and signs involving the musculoskeletal system: Secondary | ICD-10-CM | POA: Diagnosis not present

## 2021-08-14 DIAGNOSIS — R198 Other specified symptoms and signs involving the digestive system and abdomen: Secondary | ICD-10-CM

## 2021-08-14 DIAGNOSIS — R202 Paresthesia of skin: Secondary | ICD-10-CM

## 2021-08-14 DIAGNOSIS — G8929 Other chronic pain: Secondary | ICD-10-CM

## 2021-08-20 NOTE — Telephone Encounter (Signed)
Patient left a voice mail on my phone asking her a call back with her MRI results

## 2021-08-20 NOTE — Telephone Encounter (Signed)
I called pt and relayed results from Dr. Lucia Gaskins. She verbalized understanding and appreciation for the call.   Felicia Greer, you have some arthritis, nothing significant, no changes from 2021, great news have a great weekend! Dr Lucia Gaskins  Written by Anson Fret, MD on 08/16/2021 12:04 PM EDT.

## 2021-08-30 ENCOUNTER — Telehealth: Payer: Self-pay | Admitting: *Deleted

## 2021-08-30 NOTE — Telephone Encounter (Signed)
Completed Aimovig PA on Cover My Meds. Key: [id number]. Awaiting determination from [employer].

## 2021-08-30 NOTE — Telephone Encounter (Signed)
Approved today PA Case: [id number], Status: Approved, Coverage Starts on: 08/30/2021 12:00 AM, Coverage Ends on: 08/31/2022 12:00 AM. Questions? Contact [phone].

## 2021-09-20 ENCOUNTER — Telehealth: Payer: Self-pay | Admitting: Neurology

## 2021-09-20 DIAGNOSIS — R29898 Other symptoms and signs involving the musculoskeletal system: Secondary | ICD-10-CM

## 2021-09-20 DIAGNOSIS — G8929 Other chronic pain: Secondary | ICD-10-CM

## 2021-09-20 NOTE — Telephone Encounter (Signed)
Pt states the appointment with [facility] was not very beneficial.  Pt states he was more focused on her medications.  Pt states he mentioned a type of injection, she would like to know if there suggested injections can be prescribed by Dr Lucia Gaskins.  Pt does not care to go back to that provider again, please call.

## 2021-10-01 NOTE — Telephone Encounter (Signed)
I called pt and LMVM  regarding Dr. Trevor Mace note.  She could not do the injections, but if pt would like could send to Dr. Ethelene Hal (he does a lot of injections).  Just let us know.

## 2021-10-11 DIAGNOSIS — G894 Chronic pain syndrome: Secondary | ICD-10-CM | POA: Diagnosis not present

## 2021-10-11 DIAGNOSIS — I10 Essential (primary) hypertension: Secondary | ICD-10-CM | POA: Diagnosis not present

## 2021-10-11 DIAGNOSIS — M5412 Radiculopathy, cervical region: Secondary | ICD-10-CM | POA: Diagnosis not present

## 2021-10-11 DIAGNOSIS — U071 COVID-19: Secondary | ICD-10-CM | POA: Diagnosis not present

## 2021-10-17 ENCOUNTER — Telehealth: Payer: Self-pay | Admitting: Neurology

## 2021-10-17 NOTE — Addendum Note (Signed)
Addended by: Oliver Hum S on: 10/17/2021 05:00 PM   Modules accepted: Orders

## 2021-10-17 NOTE — Telephone Encounter (Signed)
Can you please put in a new referral to Dr. Nelva Bush and we will send it? Thanks

## 2021-10-17 NOTE — Telephone Encounter (Signed)
Placed referral to Dr. Nelva Bush.

## 2021-10-17 NOTE — Telephone Encounter (Signed)
Order placed amb ref  to Dr. Suella Broad (orthopedics) for should pain, possible injections.

## 2021-10-17 NOTE — Telephone Encounter (Signed)
New PA for Aimovig submitted via CMM Key: [id number].  If [employer] has not responded to your request within 24 hours, contact [place] at [phone]

## 2021-10-17 NOTE — Telephone Encounter (Signed)
Spoke with patient, she would like to be referred for shoulder pain sent to Dr. Nelva Bush.

## 2021-10-17 NOTE — Telephone Encounter (Signed)
Pt is asking for a call to discuss being referred somewhere else as it relates to her shoulder pain, her preferred call back# is [phone]

## 2021-10-18 ENCOUNTER — Telehealth: Payer: Self-pay | Admitting: Neurology

## 2021-10-18 NOTE — Telephone Encounter (Signed)
Referral sent to Dr. Ramos at [facility], phone # [phone]. 

## 2021-10-24 NOTE — Telephone Encounter (Signed)
The Aimovig PA was denied. The requested drug will be covered with prior authorization when the following criteria are met:  The requested drug is being prescribed for an FDA-approved indication or an indication supported in the compendia of current literature (examples: AHFS, Micromedex, current accepted guidelines) AND  The prescribed dose and quantity fall within the FDA-approved labeling or within dosing guidelines found in the compendia of current literature AND  The requested drug is a brand drug that has a generic equivalent or biosimilar available on formulary AND o The patient had a trial and failure of the generic equivalent or biosimilar due to an adverse event (examples: rash, nausea, vomiting, anaphylaxis) that is thought to be due to an inactive ingredient OR  The patient tried the alternate drug(s) while covered by the current or the previous health benefit plan OR  The alternate drug(s) caused or is reasonably expected to cause a harmful or adverse clinical reaction in the patient OR  The patient previously used the alternate drug(s) and it has been detrimental to the patient's health or has been ineffective in treating the same condition and is likely to be detrimental to the patient's health or ineffective in treating the condition again.

## 2021-10-25 NOTE — Telephone Encounter (Signed)
[facility] sent a fax back that they do not take [employer]. This insurance company actually ended completely in August. We need to get this patient's new insurance information and resend the referral with the new insurance information. They said that if she is self pay they will see her but we have to let them know.

## 2021-10-25 NOTE — Telephone Encounter (Signed)
LVM for the patient to call back to with current insurance information to be able to send referral back to [facility].

## 2021-10-25 NOTE — Telephone Encounter (Signed)
Pt called back with new insurance information. Pt gave permission to fax information. Faxed new insurance information to [employer] [phone].

## 2021-10-30 NOTE — Telephone Encounter (Signed)
Please give her 3 months samples (We have some in there) and advise her how to enroll to get the medication before she runs out thanks

## 2021-10-30 NOTE — Telephone Encounter (Signed)
I called pt she has not had the aimovig for over a month.  [employer] denied the aimovig, pt has tried ajovy stopped working,  other Scientist, physiological. Ingram for sample of aimovig? Until called [employer] and see what on formulary.

## 2021-10-30 NOTE — Telephone Encounter (Signed)
I called [employer] [facility] [phone]. Spoke with Ronald Pippins.  She could not find pt.  Finally she said that pt has to enroll, by calling [phone] ask for intake.   Not sure if I found out that emgality or ajovy on preferred list.

## 2021-10-31 MED ORDER — AIMOVIG 140 MG/ML ~~LOC~~ SOAJ: 140.0000 mg | SUBCUTANEOUS | 6 refills | Status: DC

## 2021-10-31 NOTE — Telephone Encounter (Signed)
I called pt after speaking with Jeani Hawking at [facility] and was able to do another PA for the aimovig  140mg /ml for pt.  Was approved from 10-31-2021 thru 11-01-2022.  [facility] [place] [facility].  Faxed the approval to them.  Pt will check with them.  I told her that can have the samples of aimovig if she still wants.  She will let us know.  PA # [id number]

## 2021-10-31 NOTE — Addendum Note (Signed)
Addended by: Brandon Melnick on: 10/31/2021 01:23 PM   Modules accepted: Orders

## 2021-10-31 NOTE — Telephone Encounter (Signed)
Okay we will get the samples ready.  I spoke with the patient and discussed she needs to enroll. She will call the 800 number and will report back if she finds out that Emgality or Ajovy are on formulary or if she is able to get Aimovig through insurance.  She will come tomorrow to pick up the samples.  She was extremely appreciative and complementary of Dr. Jaynee Eagles and our office.

## 2021-10-31 NOTE — Telephone Encounter (Signed)
I called pt and relayed did speak to [place] and spoke to [place] .  Was able to do a PA for aimovig again and was approved see other note. Spoke to pt and relayed this .  She will check with pharmacy and if wants samples she will call us back and will get ready for her.  She appreciated call back.

## 2021-11-01 NOTE — Telephone Encounter (Signed)
[facility] [place] [facility].  I called Chival, pharmacist at [facility] and He said that they are waiting for the medication to come to there pharmacy.  Her copay should be $20.00 when he ran thru.  Should be able to get from the pharmacy.

## 2021-11-12 ENCOUNTER — Telehealth (INDEPENDENT_AMBULATORY_CARE_PROVIDER_SITE_OTHER): Payer: Self-pay | Admitting: Neurology

## 2021-11-12 DIAGNOSIS — Z91199 Patient's noncompliance with other medical treatment and regimen due to unspecified reason: Secondary | ICD-10-CM
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
## 2021-11-13 ENCOUNTER — Telehealth (INDEPENDENT_AMBULATORY_CARE_PROVIDER_SITE_OTHER): Payer: 59 | Admitting: Neurology

## 2021-11-13 ENCOUNTER — Telehealth: Payer: Self-pay | Admitting: Neurology

## 2021-11-13 ENCOUNTER — Other Ambulatory Visit: Payer: Self-pay | Admitting: Neurology

## 2021-11-13 DIAGNOSIS — I639 Cerebral infarction, unspecified: Secondary | ICD-10-CM | POA: Diagnosis not present

## 2021-11-13 DIAGNOSIS — G43611 Persistent migraine aura with cerebral infarction, intractable, with status migrainosus: Secondary | ICD-10-CM

## 2021-11-13 DIAGNOSIS — R69 Illness, unspecified: Secondary | ICD-10-CM | POA: Diagnosis not present

## 2021-11-13 DIAGNOSIS — F419 Anxiety disorder, unspecified: Secondary | ICD-10-CM | POA: Diagnosis not present

## 2021-11-13 DIAGNOSIS — G43009 Migraine without aura, not intractable, without status migrainosus: Secondary | ICD-10-CM | POA: Insufficient documentation

## 2021-11-13 DIAGNOSIS — E538 Deficiency of other specified B group vitamins: Secondary | ICD-10-CM | POA: Diagnosis not present

## 2021-11-13 DIAGNOSIS — M25519 Pain in unspecified shoulder: Secondary | ICD-10-CM | POA: Diagnosis not present

## 2021-11-13 DIAGNOSIS — M542 Cervicalgia: Secondary | ICD-10-CM | POA: Diagnosis not present

## 2021-11-13 DIAGNOSIS — G8929 Other chronic pain: Secondary | ICD-10-CM | POA: Diagnosis not present

## 2021-11-13 DIAGNOSIS — M7918 Myalgia, other site: Secondary | ICD-10-CM

## 2021-11-13 DIAGNOSIS — F32A Depression, unspecified: Secondary | ICD-10-CM

## 2021-11-13 MED ORDER — NORTRIPTYLINE HCL 50 MG PO CAPS: 50.0000 mg | ORAL_CAPSULE | Freq: Every day | ORAL | 6 refills | Status: DC

## 2021-11-13 MED ORDER — VENLAFAXINE HCL ER 37.5 MG PO CP24: 37.5000 mg | ORAL_CAPSULE | Freq: Every evening | ORAL | 3 refills | Status: DC

## 2021-11-13 MED ORDER — ONDANSETRON HCL 4 MG PO TABS
4.0000 mg | ORAL_TABLET | Freq: Four times a day (QID) | ORAL | 11 refills | Status: DC | PRN
Start: 2021-11-13 — End: 2022-02-27

## 2021-11-13 MED ORDER — AIMOVIG 140 MG/ML ~~LOC~~ SOAJ: 140.0000 mg | SUBCUTANEOUS | 11 refills | Status: DC

## 2021-11-13 MED ORDER — CYCLOBENZAPRINE HCL 10 MG PO TABS: 10.0000 mg | ORAL_TABLET | Freq: Three times a day (TID) | ORAL | 3 refills | Status: DC | PRN

## 2021-11-13 MED ORDER — NURTEC 75 MG PO TBDP: 75.0000 mg | ORAL_TABLET | Freq: Every day | ORAL | 11 refills | Status: DC | PRN
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
## 2021-11-13 NOTE — Telephone Encounter (Signed)
Pt scheduled for one hour follow up with Dr. Jaynee Eagles for 02/27/22 at 11:00am

## 2021-11-13 NOTE — Telephone Encounter (Signed)
Please call and schedule her in office for an hour in 3-4 months with me please thanks

## 2021-11-14 ENCOUNTER — Other Ambulatory Visit: Payer: Self-pay | Admitting: Neurology

## 2021-11-14 ENCOUNTER — Telehealth: Payer: Self-pay | Admitting: *Deleted

## 2021-11-14 DIAGNOSIS — G43009 Migraine without aura, not intractable, without status migrainosus: Secondary | ICD-10-CM

## 2021-11-14 NOTE — Telephone Encounter (Signed)
Received message from pharmacy. Nurtec is not covered and there was no option for a PA. Will see if Dr Jaynee Eagles is ok with ordering Ubrelvy instead.

## 2021-11-15 MED ORDER — UBRELVY 100 MG PO TABS: ORAL_TABLET | ORAL | 5 refills | Status: DC

## 2021-11-15 NOTE — Telephone Encounter (Signed)
Yes let her know and send ubrelvy to my scripts thanks

## 2021-11-15 NOTE — Telephone Encounter (Signed)
Done

## 2021-11-15 NOTE — Telephone Encounter (Signed)
Nurtec not covered. I sent message to Dr Jaynee Eagles.

## 2021-11-15 NOTE — Addendum Note (Signed)
Addended by: Brandon Melnick on: 11/15/2021 01:42 PM   Modules accepted: Orders

## 2021-11-18 ENCOUNTER — Encounter: Payer: Self-pay | Admitting: Neurology

## 2021-11-21 ENCOUNTER — Telehealth: Payer: Self-pay | Admitting: *Deleted

## 2021-11-21 NOTE — Telephone Encounter (Signed)
Roselyn Meier PA, Key: [id number], no eligibility found. Called health plan, "call cannot be completed at this time, call later".  Will try later.

## 2021-11-22 DIAGNOSIS — M25511 Pain in right shoulder: Secondary | ICD-10-CM | POA: Diagnosis not present

## 2021-11-22 NOTE — Telephone Encounter (Signed)
Called pharmacy, spoke with Altha Harm. Patient has [employer] ID [id number], [place], PCN [id number], group [id number].   New PA on CMM,  Key: [facility], faxed notes to be attached.

## 2021-11-22 NOTE — Telephone Encounter (Signed)
Called patient, LVM advising her I cannot complete Roselyn Meier PA with current insurance on file: [employer]. Requested she call back with information or send via My chart.

## 2021-11-22 NOTE — Telephone Encounter (Signed)
Notes attached. Your information has been submitted to [employer]. If [employer] has not responded to your request within 24 hours, contact [place] at [phone]

## 2021-11-26 DIAGNOSIS — Z681 Body mass index (BMI) 19 or less, adult: Secondary | ICD-10-CM | POA: Diagnosis not present

## 2021-11-26 DIAGNOSIS — G8929 Other chronic pain: Secondary | ICD-10-CM | POA: Diagnosis not present

## 2021-11-26 NOTE — Telephone Encounter (Signed)
Roselyn Meier approved from 11/23/2021 to 11/24/2022. Approval letter faxed to pharmacy.

## 2021-12-06 ENCOUNTER — Other Ambulatory Visit: Payer: Self-pay | Admitting: Neurology

## 2021-12-21 DIAGNOSIS — K047 Periapical abscess without sinus: Secondary | ICD-10-CM | POA: Diagnosis not present

## 2021-12-21 DIAGNOSIS — I10 Essential (primary) hypertension: Secondary | ICD-10-CM | POA: Diagnosis not present

## 2021-12-21 DIAGNOSIS — G894 Chronic pain syndrome: Secondary | ICD-10-CM | POA: Diagnosis not present

## 2021-12-21 DIAGNOSIS — M5412 Radiculopathy, cervical region: Secondary | ICD-10-CM | POA: Diagnosis not present

## 2022-01-09 DIAGNOSIS — R69 Illness, unspecified: Secondary | ICD-10-CM | POA: Diagnosis not present

## 2022-01-09 DIAGNOSIS — Z885 Allergy status to narcotic agent status: Secondary | ICD-10-CM | POA: Diagnosis not present

## 2022-01-09 DIAGNOSIS — F325 Major depressive disorder, single episode, in full remission: Secondary | ICD-10-CM | POA: Diagnosis not present

## 2022-01-09 DIAGNOSIS — Z87891 Personal history of nicotine dependence: Secondary | ICD-10-CM | POA: Diagnosis not present

## 2022-01-09 DIAGNOSIS — Z809 Family history of malignant neoplasm, unspecified: Secondary | ICD-10-CM | POA: Diagnosis not present

## 2022-01-09 DIAGNOSIS — M792 Neuralgia and neuritis, unspecified: Secondary | ICD-10-CM | POA: Diagnosis not present

## 2022-01-09 DIAGNOSIS — G43909 Migraine, unspecified, not intractable, without status migrainosus: Secondary | ICD-10-CM | POA: Diagnosis not present

## 2022-01-09 DIAGNOSIS — G4700 Insomnia, unspecified: Secondary | ICD-10-CM | POA: Diagnosis not present

## 2022-01-09 DIAGNOSIS — M1990 Unspecified osteoarthritis, unspecified site: Secondary | ICD-10-CM | POA: Diagnosis not present

## 2022-01-09 DIAGNOSIS — M62838 Other muscle spasm: Secondary | ICD-10-CM | POA: Diagnosis not present

## 2022-01-09 DIAGNOSIS — R32 Unspecified urinary incontinence: Secondary | ICD-10-CM | POA: Diagnosis not present

## 2022-01-09 DIAGNOSIS — K589 Irritable bowel syndrome without diarrhea: Secondary | ICD-10-CM | POA: Diagnosis not present

## 2022-01-10 ENCOUNTER — Other Ambulatory Visit: Payer: Self-pay | Admitting: Neurology

## 2022-01-17 DIAGNOSIS — J0100 Acute maxillary sinusitis, unspecified: Secondary | ICD-10-CM | POA: Diagnosis not present

## 2022-01-17 DIAGNOSIS — G894 Chronic pain syndrome: Secondary | ICD-10-CM | POA: Diagnosis not present

## 2022-01-17 DIAGNOSIS — R091 Pleurisy: Secondary | ICD-10-CM | POA: Diagnosis not present

## 2022-01-17 DIAGNOSIS — M5412 Radiculopathy, cervical region: Secondary | ICD-10-CM | POA: Diagnosis not present

## 2022-02-25 DIAGNOSIS — G894 Chronic pain syndrome: Secondary | ICD-10-CM | POA: Diagnosis not present

## 2022-02-25 DIAGNOSIS — I10 Essential (primary) hypertension: Secondary | ICD-10-CM | POA: Diagnosis not present

## 2022-02-25 DIAGNOSIS — G629 Polyneuropathy, unspecified: Secondary | ICD-10-CM | POA: Diagnosis not present

## 2022-02-25 DIAGNOSIS — R69 Illness, unspecified: Secondary | ICD-10-CM | POA: Diagnosis not present

## 2022-02-25 DIAGNOSIS — Z681 Body mass index (BMI) 19 or less, adult: Secondary | ICD-10-CM | POA: Diagnosis not present

## 2022-02-25 DIAGNOSIS — M5412 Radiculopathy, cervical region: Secondary | ICD-10-CM | POA: Diagnosis not present

## 2022-02-27 ENCOUNTER — Ambulatory Visit (INDEPENDENT_AMBULATORY_CARE_PROVIDER_SITE_OTHER): Payer: 59 | Admitting: Neurology

## 2022-02-27 VITALS — BP 121/73 | HR 91 | Ht 64.0 in | Wt 116.6 lb

## 2022-02-27 DIAGNOSIS — M25519 Pain in unspecified shoulder: Secondary | ICD-10-CM

## 2022-02-27 DIAGNOSIS — G43009 Migraine without aura, not intractable, without status migrainosus: Secondary | ICD-10-CM

## 2022-02-27 DIAGNOSIS — M542 Cervicalgia: Secondary | ICD-10-CM | POA: Diagnosis not present

## 2022-02-27 DIAGNOSIS — G8929 Other chronic pain: Secondary | ICD-10-CM | POA: Diagnosis not present

## 2022-02-27 MED ORDER — NORTRIPTYLINE HCL 50 MG PO CAPS: 50.0000 mg | ORAL_CAPSULE | Freq: Every day | ORAL | 6 refills | Status: DC

## 2022-02-27 MED ORDER — AIMOVIG 140 MG/ML ~~LOC~~ SOAJ: 140.0000 mg | SUBCUTANEOUS | 11 refills | Status: DC

## 2022-02-27 MED ORDER — CYCLOBENZAPRINE HCL 10 MG PO TABS: 10.0000 mg | ORAL_TABLET | Freq: Three times a day (TID) | ORAL | 3 refills | Status: DC | PRN

## 2022-02-27 MED ORDER — VENLAFAXINE HCL ER 37.5 MG PO CP24: 37.5000 mg | ORAL_CAPSULE | Freq: Every evening | ORAL | 3 refills | Status: DC

## 2022-02-27 MED ORDER — ONDANSETRON HCL 4 MG PO TABS: 4.0000 mg | ORAL_TABLET | Freq: Four times a day (QID) | ORAL | 11 refills | Status: AC | PRN
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
## 2022-03-21 ENCOUNTER — Encounter: Payer: Self-pay | Admitting: Radiology

## 2022-03-28 DIAGNOSIS — G629 Polyneuropathy, unspecified: Secondary | ICD-10-CM | POA: Diagnosis not present

## 2022-03-28 DIAGNOSIS — G894 Chronic pain syndrome: Secondary | ICD-10-CM | POA: Diagnosis not present

## 2022-03-28 DIAGNOSIS — M5412 Radiculopathy, cervical region: Secondary | ICD-10-CM | POA: Diagnosis not present

## 2022-03-28 DIAGNOSIS — I10 Essential (primary) hypertension: Secondary | ICD-10-CM | POA: Diagnosis not present

## 2022-06-07 DIAGNOSIS — I10 Essential (primary) hypertension: Secondary | ICD-10-CM | POA: Diagnosis not present

## 2022-06-07 DIAGNOSIS — G894 Chronic pain syndrome: Secondary | ICD-10-CM | POA: Diagnosis not present

## 2022-06-07 DIAGNOSIS — M5412 Radiculopathy, cervical region: Secondary | ICD-10-CM | POA: Diagnosis not present

## 2022-06-07 DIAGNOSIS — G629 Polyneuropathy, unspecified: Secondary | ICD-10-CM | POA: Diagnosis not present

## 2022-07-04 DIAGNOSIS — F419 Anxiety disorder, unspecified: Secondary | ICD-10-CM | POA: Diagnosis not present

## 2022-07-04 DIAGNOSIS — G894 Chronic pain syndrome: Secondary | ICD-10-CM | POA: Diagnosis not present

## 2022-07-04 DIAGNOSIS — G629 Polyneuropathy, unspecified: Secondary | ICD-10-CM | POA: Diagnosis not present

## 2022-07-04 DIAGNOSIS — M5412 Radiculopathy, cervical region: Secondary | ICD-10-CM | POA: Diagnosis not present

## 2022-08-28 ENCOUNTER — Other Ambulatory Visit: Payer: Self-pay | Admitting: Neurology

## 2022-08-28 ENCOUNTER — Telehealth: Payer: Self-pay | Admitting: Neurology

## 2022-08-28 DIAGNOSIS — M5412 Radiculopathy, cervical region: Secondary | ICD-10-CM | POA: Diagnosis not present

## 2022-08-28 DIAGNOSIS — G894 Chronic pain syndrome: Secondary | ICD-10-CM | POA: Diagnosis not present

## 2022-08-28 DIAGNOSIS — F419 Anxiety disorder, unspecified: Secondary | ICD-10-CM | POA: Diagnosis not present

## 2022-08-28 DIAGNOSIS — I10 Essential (primary) hypertension: Secondary | ICD-10-CM | POA: Diagnosis not present

## 2022-08-28 MED ORDER — UBRELVY 100 MG PO TABS: ORAL_TABLET | ORAL | 5 refills | Status: AC

## 2022-08-28 NOTE — Telephone Encounter (Signed)
Pt is requesting a refill for Ubrogepant (UBRELVY) 100 MG TABS.  Pharmacy: [facility]

## 2022-08-28 NOTE — Telephone Encounter (Signed)
Bernita Raisin refills sent to [facility]

## 2022-09-10 ENCOUNTER — Other Ambulatory Visit (HOSPITAL_COMMUNITY): Payer: Self-pay | Admitting: Internal Medicine

## 2022-09-10 DIAGNOSIS — Z1231 Encounter for screening mammogram for malignant neoplasm of breast: Secondary | ICD-10-CM

## 2022-09-16 ENCOUNTER — Ambulatory Visit (HOSPITAL_COMMUNITY)
Admission: RE | Admit: 2022-09-16 | Discharge: 2022-09-16 | Disposition: A | Discharge status: Home / Self Care | Payer: 59 | Source: Ambulatory Visit | Attending: Internal Medicine | Admitting: Internal Medicine

## 2022-09-16 DIAGNOSIS — Z1231 Encounter for screening mammogram for malignant neoplasm of breast: Secondary | ICD-10-CM | POA: Insufficient documentation

## 2022-09-26 DIAGNOSIS — Z1211 Encounter for screening for malignant neoplasm of colon: Secondary | ICD-10-CM | POA: Diagnosis not present

## 2022-09-26 DIAGNOSIS — Z1212 Encounter for screening for malignant neoplasm of rectum: Secondary | ICD-10-CM | POA: Diagnosis not present

## 2022-10-07 ENCOUNTER — Encounter (INDEPENDENT_AMBULATORY_CARE_PROVIDER_SITE_OTHER): Payer: Self-pay | Admitting: *Deleted

## 2022-10-08 ENCOUNTER — Encounter (INDEPENDENT_AMBULATORY_CARE_PROVIDER_SITE_OTHER): Payer: Self-pay | Admitting: Gastroenterology

## 2022-10-08 ENCOUNTER — Ambulatory Visit (INDEPENDENT_AMBULATORY_CARE_PROVIDER_SITE_OTHER): Payer: 59 | Admitting: Gastroenterology

## 2022-10-08 VITALS — BP 157/84 | HR 86 | Temp 98.1°F | Ht 64.0 in | Wt 119.5 lb

## 2022-10-08 DIAGNOSIS — K219 Gastro-esophageal reflux disease without esophagitis: Secondary | ICD-10-CM | POA: Diagnosis not present

## 2022-10-08 DIAGNOSIS — I10 Essential (primary) hypertension: Secondary | ICD-10-CM | POA: Diagnosis not present

## 2022-10-08 DIAGNOSIS — R490 Dysphonia: Secondary | ICD-10-CM | POA: Insufficient documentation

## 2022-10-08 DIAGNOSIS — R195 Other fecal abnormalities: Secondary | ICD-10-CM | POA: Insufficient documentation

## 2022-10-08 MED ORDER — PEG 3350-KCL-NA BICARB-NACL 420 G PO SOLR: 4000.0000 mL | Freq: Once | ORAL | 0 refills | Status: AC

## 2022-10-08 NOTE — Patient Instructions (Signed)
It was very nice to meet you today, as dicussed with will plan for the following :  1) Upper endoscopy and colonoscopy  2) Please see your PCP for better BP control   3) Continue Prilosec  1) Avoid coffee, tea, cola beverages, carbonated beverages, spicy foods, greasy foods, foods high in acid content (e.g. tomatoes and citrus fruits), chocolate, and peppermint 2) Avoid drinking alcoholic beverages 3) Avoid smoking 4) Eat small meals and keep weight within normal range 5) Avoid recumbent posture for 3 hours post-prandially 6) Elevate head of bed

## 2022-10-08 NOTE — Progress Notes (Signed)
Felicia Greer , M.D. Gastroenterology & Hepatology [facility] Gastroenterology [address] [place], [place] [address] Primary Care Physician: Elfredia Nevins, MD [address] [place] [address]  Chief Complaint:  Positive Cologaurd, Refractory GERD   History of Present Illness: Felicia Greer is a 62 y.o. female with lumbar spondylosis, cervical radiculopathy, hypertension, chronic pain with family history of colon cancer who presents for evaluation of positive Cologuard and refractory GERD.  Patient reports that her mother had colon mass and had a colon cancer is not sure of the exact age but states was of age 34.  Patient denies seeing blood in stool and having regular bowel movement daily.  Reports recently her heartburn has gotten worse with change in her voice would wake her up in the middle of the night choking sensation, she has been on PPI for most of her life.  Never had colonoscopy. The patient denies having any nausea, vomiting, fever, chills, hematochezia, melena, hematemesis, abdominal distention, abdominal pain, diarrhea, jaundice, pruritus or weight loss.  Last UJW:JXBJ Last Colonoscopy:None   FHx: Mother colon cancer - after age 83 . Father multiple polyps  Surgical: no abdominal surgeries  Past Medical History: Past Medical History:  Diagnosis Date   Acid reflux    Arthritis    pelvic bone   Back pain    BV (bacterial vaginosis) 4 18 2012   COPD (chronic obstructive pulmonary disease) (HCC) 2010   Dyspareunia, female 01/05/2015   Hx of goiter    Hx: UTI (urinary tract infection)    Hypertension    Osteopenia determined by x-ray 03/10/2014   Pelvic pain in female 03/01/2014   Rhinitis 02/15/2014   Urinary frequency 01/05/2015   Vaginal odor 01/05/2015    Past Surgical History: Past Surgical History:  Procedure Laterality Date   BIOPSY THYROID N/A 06/2009   had fna   BREAST BIOPSY Right in the 1990s   BREAST BIOPSY   1980   benign   CERVICAL FUSION N/A in the 1990s   HYSTEROSCOPY W/ ENDOMETRIAL ABLATION N/A 2004   TUBAL LIGATION      Family History: Family History  Problem Relation Age of Onset   Fibromyalgia Mother    Other Mother        sciatic nerve; bad shoulder   Colon cancer Mother    Other Father        collapsed lung   COPD Father    Heart attack Father    Other Sister        valves replaced in heart   Heart attack Sister    Anxiety disorder Brother    Other Brother        takes med for heart   Cancer Maternal Aunt        cervical    Migraines Maternal Aunt    Breast cancer Maternal Aunt    Breast cancer Maternal Grandmother    Emphysema Maternal Grandfather    Heart attack Paternal Grandmother    Anxiety disorder Daughter    Breast cancer Other    Migraines Other    Cancer Other        breast    Social History: Social History   Tobacco Use  Smoking Status Every Day   Current packs/day: 1.00   Average packs/day: 1 pack/day for 50.0 years (50.0 ttl pk-yrs)   Types: Cigarettes   Start date: 10/25/1972   Passive exposure: Current  Smokeless Tobacco Never  Tobacco Comments   use  electronic cigs   Social History   Substance and Sexual Activity  Alcohol Use No   Alcohol/week: 0.0 standard drinks of alcohol   Social History   Substance and Sexual Activity  Drug Use No    Allergies: Allergies  Allergen Reactions   Codeine Nausea Only   Metrogel [Metronidazole]     Had local reaction   Protonix [Pantoprazole Sodium] Rash    Medications: Current Outpatient Medications  Medication Sig Dispense Refill   ALPRAZolam (XANAX) 1 MG tablet Take 1 tablet by mouth at bedtime. *May take 1 tablet three times daily as needed for anxiety  0   aspirin 325 MG tablet Take 1 tablet (325 mg total) by mouth daily.     cyclobenzaprine (FLEXERIL) 10 MG tablet Take 1 tablet (10 mg total) by mouth 3 (three) times daily as needed for muscle spasms. 90 tablet 3   Erenumab-aooe  (AIMOVIG) 140 MG/ML SOAJ Inject 140 mg into the skin every 3 (three) months. 1.12 mL 11   HYDROcodone-acetaminophen (NORCO) 10-325 MG tablet Take 1 tablet by mouth every 6 (six) hours as needed.     nortriptyline (PAMELOR) 50 MG capsule Take 1 capsule (50 mg total) by mouth at bedtime. 90 capsule 6   ondansetron (ZOFRAN) 4 MG tablet Take 1 tablet (4 mg total) by mouth every 6 (six) hours as needed for nausea or vomiting (migraine). 20 tablet 11   oxybutynin (DITROPAN) 5 MG tablet Take 5 mg by mouth at bedtime.     Ubrogepant (UBRELVY) 100 MG TABS Take one tablet onset migraine and another tablet in 2 hours as needed  (max 2/24hr) 16 tablet 5   venlafaxine XR (EFFEXOR XR) 37.5 MG 24 hr capsule Take 1 capsule (37.5 mg total) by mouth at bedtime. 90 capsule 3   No current facility-administered medications for this visit.    Review of Systems: GENERAL: negative for malaise, night sweats HEENT: No changes in hearing or vision, no nose bleeds or other nasal problems. NECK: Negative for lumps, goiter, pain and significant neck swelling RESPIRATORY: Negative for cough, wheezing CARDIOVASCULAR: Negative for chest pain, leg swelling, palpitations, orthopnea GI: SEE HPI MUSCULOSKELETAL: Negative for joint pain or swelling, back pain, and muscle pain. SKIN: Negative for lesions, rash HEMATOLOGY Negative for prolonged bleeding, bruising easily, and swollen nodes. ENDOCRINE: Negative for cold or heat intolerance, polyuria, polydipsia and goiter. NEURO: negative for tremor, gait imbalance, syncope and seizures. The remainder of the review of systems is noncontributory.   Physical Exam: BP (!) 157/84   Pulse 86   Temp 98.1 F (36.7 C) (Oral)   Ht 5\' 4"  (1.626 m)   Wt 119 lb 8 oz (54.2 kg)   BMI 20.51 kg/m  GENERAL: The patient is AO x3, in no acute distress. HEENT: Head is normocephalic and atraumatic. EOMI are intact. Mouth is well hydrated and without lesions. NECK: Supple. No masses LUNGS:  Clear to auscultation. No presence of rhonchi/wheezing/rales. Adequate chest expansion HEART: RRR, normal s1 and s2. ABDOMEN: Soft, nontender, no guarding, no peritoneal signs, and nondistended. BS +. No masses. EXTREMITIES: Without any cyanosis, clubbing, rash, lesions or edema. NEUROLOGIC: AOx3, no focal motor deficit. SKIN: no jaundice, no rashes   Imaging/Labs: as above     Latest Ref Rng & Units 12/01/2018    2:16 PM 02/15/2014   12:03 PM 10/07/2011    2:41 AM  CBC  WBC 4.0 - 10.5 K/uL 9.1  6.7  8.3   Hemoglobin 12.0 - 15.0 g/dL 13.1  13.6  13.2   Hematocrit 36.0 - 46.0 % 40.3  41.9  38.3   Platelets 150 - 400 K/uL 312  292  250    No results found for: "IRON", "TIBC", "FERRITIN"  I personally reviewed and interpreted the available labs, imaging and endoscopic files.  Impression and Plan: Felicia Greer is a 62 y.o. female with lumbar spondylosis, cervical radiculopathy, hypertension, chronic pain with family history of colon cancer who presents for evaluation of positive Cologuard and refractory GERD.  #Positive Cologuard  Patient does  have any high risk factor for colorectal cancer malignancy given family history of Colon cancer in mother .    She has been asymptomatic. Discussed cologuard results in detail, specifically what it means when the test is positive or negative.  Discussed that there is a possibility that even when the test is positive there may not be a polyp found on colonoscopy. More than 50% of the office visit was dedicated to discussing the procedure, including the day of and risks involved. Patient understands what the procedure involves including the benefits and any risks. Patient understands alternatives to the proposed procedure. Risks including (but not limited to) bleeding, tearing of the lining (perforation), rupture of adjacent organs, problems with heart and lung function, infection, and medication reactions. A small percentage of complications may  require surgery, hospitalization, repeat endoscopic procedure, and/or transfusion. A small percentage of polyps and other tumors may not be seen.  - Schedule colonoscopy  #Refractory Chronic GERD  Patient reports heartburn for most of her life on PPI with recent worsening symptoms with change in voice and nighttime awakening with choking.  Given recent worsening atypical symptoms of GERD chronic smoking need to rule out underlying lesion and sequela of chronic GERD such as esophagitis Barrett's and peptic stricture.  Will proceed with upper endoscopy    - omeprazole 40mg /po/daily - advised to take 30 min before breakfast  1) Avoid coffee, tea, cola beverages, carbonated beverages, spicy foods, greasy foods, foods high in acid content (e.g. tomatoes and citrus fruits), chocolate, and peppermint 2) Avoid drinking alcoholic beverages 3) Avoid smoking 4) Eat small meals and keep weight within normal range 5) Avoid recumbent posture for 3 hours post-prandially 6) Elevate head of bed  #HTN The patient was found to have elevated blood pressure when vital signs were checked in the office. The blood pressure was rechecked by the nursing staff and it was found be persistently elevated >140/90 mmHg. I personally advised to the patient to follow up closely with PCP for hypertension control.      All questions were answered.      Felicia Lawman, MD Gastroenterology and Hepatology [facility] Gastroenterology   This chart has been completed using [facility] Dictation software, and while attempts have been made to ensure accuracy , certain words and phrases may not be transcribed as intended

## 2022-10-08 NOTE — H&P (View-Only) (Signed)
Vista Lawman , M.D. Gastroenterology & Hepatology [facility] Gastroenterology [address] [place], [place] [address] Primary Care Physician: Elfredia Nevins, MD [address] [place] [address]  Chief Complaint:  Positive Cologaurd, Refractory GERD   History of Present Illness: Felicia Greer is a 62 y.o. female with lumbar spondylosis, cervical radiculopathy, hypertension, chronic pain with family history of colon cancer who presents for evaluation of positive Cologuard and refractory GERD.  Patient reports that her mother had colon mass and had a colon cancer is not sure of the exact age but states was of age 34.  Patient denies seeing blood in stool and having regular bowel movement daily.  Reports recently her heartburn has gotten worse with change in her voice would wake her up in the middle of the night choking sensation, she has been on PPI for most of her life.  Never had colonoscopy. The patient denies having any nausea, vomiting, fever, chills, hematochezia, melena, hematemesis, abdominal distention, abdominal pain, diarrhea, jaundice, pruritus or weight loss.  Last UJW:JXBJ Last Colonoscopy:None   FHx: Mother colon cancer - after age 83 . Father multiple polyps  Surgical: no abdominal surgeries  Past Medical History: Past Medical History:  Diagnosis Date   Acid reflux    Arthritis    pelvic bone   Back pain    BV (bacterial vaginosis) 4 18 2012   COPD (chronic obstructive pulmonary disease) (HCC) 2010   Dyspareunia, female 01/05/2015   Hx of goiter    Hx: UTI (urinary tract infection)    Hypertension    Osteopenia determined by x-ray 03/10/2014   Pelvic pain in female 03/01/2014   Rhinitis 02/15/2014   Urinary frequency 01/05/2015   Vaginal odor 01/05/2015    Past Surgical History: Past Surgical History:  Procedure Laterality Date   BIOPSY THYROID N/A 06/2009   had fna   BREAST BIOPSY Right in the 1990s   BREAST BIOPSY   1980   benign   CERVICAL FUSION N/A in the 1990s   HYSTEROSCOPY W/ ENDOMETRIAL ABLATION N/A 2004   TUBAL LIGATION      Family History: Family History  Problem Relation Age of Onset   Fibromyalgia Mother    Other Mother        sciatic nerve; bad shoulder   Colon cancer Mother    Other Father        collapsed lung   COPD Father    Heart attack Father    Other Sister        valves replaced in heart   Heart attack Sister    Anxiety disorder Brother    Other Brother        takes med for heart   Cancer Maternal Aunt        cervical    Migraines Maternal Aunt    Breast cancer Maternal Aunt    Breast cancer Maternal Grandmother    Emphysema Maternal Grandfather    Heart attack Paternal Grandmother    Anxiety disorder Daughter    Breast cancer Other    Migraines Other    Cancer Other        breast    Social History: Social History   Tobacco Use  Smoking Status Every Day   Current packs/day: 1.00   Average packs/day: 1 pack/day for 50.0 years (50.0 ttl pk-yrs)   Types: Cigarettes   Start date: 10/25/1972   Passive exposure: Current  Smokeless Tobacco Never  Tobacco Comments   use  electronic cigs   Social History   Substance and Sexual Activity  Alcohol Use No   Alcohol/week: 0.0 standard drinks of alcohol   Social History   Substance and Sexual Activity  Drug Use No    Allergies: Allergies  Allergen Reactions   Codeine Nausea Only   Metrogel [Metronidazole]     Had local reaction   Protonix [Pantoprazole Sodium] Rash    Medications: Current Outpatient Medications  Medication Sig Dispense Refill   ALPRAZolam (XANAX) 1 MG tablet Take 1 tablet by mouth at bedtime. *May take 1 tablet three times daily as needed for anxiety  0   aspirin 325 MG tablet Take 1 tablet (325 mg total) by mouth daily.     cyclobenzaprine (FLEXERIL) 10 MG tablet Take 1 tablet (10 mg total) by mouth 3 (three) times daily as needed for muscle spasms. 90 tablet 3   Erenumab-aooe  (AIMOVIG) 140 MG/ML SOAJ Inject 140 mg into the skin every 3 (three) months. 1.12 mL 11   HYDROcodone-acetaminophen (NORCO) 10-325 MG tablet Take 1 tablet by mouth every 6 (six) hours as needed.     nortriptyline (PAMELOR) 50 MG capsule Take 1 capsule (50 mg total) by mouth at bedtime. 90 capsule 6   ondansetron (ZOFRAN) 4 MG tablet Take 1 tablet (4 mg total) by mouth every 6 (six) hours as needed for nausea or vomiting (migraine). 20 tablet 11   oxybutynin (DITROPAN) 5 MG tablet Take 5 mg by mouth at bedtime.     Ubrogepant (UBRELVY) 100 MG TABS Take one tablet onset migraine and another tablet in 2 hours as needed  (max 2/24hr) 16 tablet 5   venlafaxine XR (EFFEXOR XR) 37.5 MG 24 hr capsule Take 1 capsule (37.5 mg total) by mouth at bedtime. 90 capsule 3   No current facility-administered medications for this visit.    Review of Systems: GENERAL: negative for malaise, night sweats HEENT: No changes in hearing or vision, no nose bleeds or other nasal problems. NECK: Negative for lumps, goiter, pain and significant neck swelling RESPIRATORY: Negative for cough, wheezing CARDIOVASCULAR: Negative for chest pain, leg swelling, palpitations, orthopnea GI: SEE HPI MUSCULOSKELETAL: Negative for joint pain or swelling, back pain, and muscle pain. SKIN: Negative for lesions, rash HEMATOLOGY Negative for prolonged bleeding, bruising easily, and swollen nodes. ENDOCRINE: Negative for cold or heat intolerance, polyuria, polydipsia and goiter. NEURO: negative for tremor, gait imbalance, syncope and seizures. The remainder of the review of systems is noncontributory.   Physical Exam: BP (!) 157/84   Pulse 86   Temp 98.1 F (36.7 C) (Oral)   Ht 5\' 4"  (1.626 m)   Wt 119 lb 8 oz (54.2 kg)   BMI 20.51 kg/m  GENERAL: The patient is AO x3, in no acute distress. HEENT: Head is normocephalic and atraumatic. EOMI are intact. Mouth is well hydrated and without lesions. NECK: Supple. No masses LUNGS:  Clear to auscultation. No presence of rhonchi/wheezing/rales. Adequate chest expansion HEART: RRR, normal s1 and s2. ABDOMEN: Soft, nontender, no guarding, no peritoneal signs, and nondistended. BS +. No masses. EXTREMITIES: Without any cyanosis, clubbing, rash, lesions or edema. NEUROLOGIC: AOx3, no focal motor deficit. SKIN: no jaundice, no rashes   Imaging/Labs: as above     Latest Ref Rng & Units 12/01/2018    2:16 PM 02/15/2014   12:03 PM 10/07/2011    2:41 AM  CBC  WBC 4.0 - 10.5 K/uL 9.1  6.7  8.3   Hemoglobin 12.0 - 15.0 g/dL 13.1  13.6  13.2   Hematocrit 36.0 - 46.0 % 40.3  41.9  38.3   Platelets 150 - 400 K/uL 312  292  250    No results found for: "IRON", "TIBC", "FERRITIN"  I personally reviewed and interpreted the available labs, imaging and endoscopic files.  Impression and Plan: Felicia Greer is a 62 y.o. female with lumbar spondylosis, cervical radiculopathy, hypertension, chronic pain with family history of colon cancer who presents for evaluation of positive Cologuard and refractory GERD.  #Positive Cologuard  Patient does  have any high risk factor for colorectal cancer malignancy given family history of Colon cancer in mother .    She has been asymptomatic. Discussed cologuard results in detail, specifically what it means when the test is positive or negative.  Discussed that there is a possibility that even when the test is positive there may not be a polyp found on colonoscopy. More than 50% of the office visit was dedicated to discussing the procedure, including the day of and risks involved. Patient understands what the procedure involves including the benefits and any risks. Patient understands alternatives to the proposed procedure. Risks including (but not limited to) bleeding, tearing of the lining (perforation), rupture of adjacent organs, problems with heart and lung function, infection, and medication reactions. A small percentage of complications may  require surgery, hospitalization, repeat endoscopic procedure, and/or transfusion. A small percentage of polyps and other tumors may not be seen.  - Schedule colonoscopy  #Refractory Chronic GERD  Patient reports heartburn for most of her life on PPI with recent worsening symptoms with change in voice and nighttime awakening with choking.  Given recent worsening atypical symptoms of GERD chronic smoking need to rule out underlying lesion and sequela of chronic GERD such as esophagitis Barrett's and peptic stricture.  Will proceed with upper endoscopy    - omeprazole 40mg /po/daily - advised to take 30 min before breakfast  1) Avoid coffee, tea, cola beverages, carbonated beverages, spicy foods, greasy foods, foods high in acid content (e.g. tomatoes and citrus fruits), chocolate, and peppermint 2) Avoid drinking alcoholic beverages 3) Avoid smoking 4) Eat small meals and keep weight within normal range 5) Avoid recumbent posture for 3 hours post-prandially 6) Elevate head of bed  #HTN The patient was found to have elevated blood pressure when vital signs were checked in the office. The blood pressure was rechecked by the nursing staff and it was found be persistently elevated >140/90 mmHg. I personally advised to the patient to follow up closely with PCP for hypertension control.      All questions were answered.      Vista Lawman, MD Gastroenterology and Hepatology [facility] Gastroenterology   This chart has been completed using [facility] Dictation software, and while attempts have been made to ensure accuracy , certain words and phrases may not be transcribed as intended

## 2022-10-09 ENCOUNTER — Encounter: Payer: Self-pay | Admitting: Adult Health

## 2022-10-09 ENCOUNTER — Other Ambulatory Visit (HOSPITAL_COMMUNITY)
Admission: RE | Admit: 2022-10-09 | Discharge: 2022-10-09 | Disposition: A | Discharge status: Home / Self Care | Payer: 59 | Source: Ambulatory Visit | Attending: Adult Health | Admitting: Adult Health

## 2022-10-09 ENCOUNTER — Ambulatory Visit: Payer: 59 | Admitting: Adult Health

## 2022-10-09 VITALS — BP 148/85 | HR 68 | Ht 64.0 in | Wt 118.5 lb

## 2022-10-09 DIAGNOSIS — Z01419 Encounter for gynecological examination (general) (routine) without abnormal findings: Secondary | ICD-10-CM | POA: Insufficient documentation

## 2022-10-09 DIAGNOSIS — I10 Essential (primary) hypertension: Secondary | ICD-10-CM

## 2022-10-09 DIAGNOSIS — Z1211 Encounter for screening for malignant neoplasm of colon: Secondary | ICD-10-CM | POA: Diagnosis not present

## 2022-10-09 LAB — HEMOCCULT GUIAC POC 1CARD (OFFICE): Fecal Occult Blood, POC: NEGATIVE

## 2022-10-09 NOTE — Progress Notes (Signed)
Patient ID: Felicia Greer, female   DOB: 1960/09/21, 62 y.o.   MRN: [id number] History of Present Illness: Felicia Greer is a 62 year old white female, married, PM in for a well woman gyn exam and pap. She saw GI yesterday and is having colonoscopy and EGD 10/29/22. Mom died since I have seen her and she is helping with 18 year old grandson some. Husband has RA.   PCP is Dr Sherwood Gambler.   Current Medications, Allergies, Past Medical History, Past Surgical History, Family History and Social History were reviewed in [employer] electronic medical record.     Review of Systems: Patient denies any headaches, hearing loss, fatigue, blurred vision, shortness of breath, chest pain, abdominal pain, problems with bowel movements, urination, or intercourse(not active). No joint pain or mood swings.     Physical Exam:BP (!) 148/85 (BP Location: Right Arm, Patient Position: Sitting, Cuff Size: Normal)   Pulse 68   Ht 5\' 4"  (1.626 m)   Wt 118 lb 8 oz (53.8 kg)   BMI 20.34 kg/m   General:  Well developed, well nourished, no acute distress Skin:  Warm and dry Neck:  Midline trachea, normal thyroid, good ROM, no lymphadenopathy, no carotid bruits heard. Lungs; Clear to auscultation bilaterally Breast:  No dominant palpable mass, retraction, or nipple discharge Cardiovascular: Regular rate and rhythm Abdomen:  Soft, non tender, no hepatosplenomegaly Pelvic:  External genitalia is normal in appearance, no lesions.  The vagina is pale and atrophic. Urethra has no lesions or masses. The cervix is smooth, pap with HR HPV genotyping performed. Uterus is felt to be normal size, shape, and contour.  No adnexal masses or tenderness noted.Bladder is non tender, no masses felt. Rectal: Good sphincter tone, no polyps, or hemorrhoids felt.  Hemoccult negative. Extremities/musculoskeletal:  No swelling or varicosities noted, no clubbing or cyanosis Psych:  No mood changes, alert and cooperative,seems happy AA is 0 Fall risk is  low    10/09/2022   10:53 AM 10/27/2017    2:54 PM 10/03/2016   11:27 AM  Depression screen PHQ 2/9  Decreased Interest 0 0 0  Down, Depressed, Hopeless 0 0 0  PHQ - 2 Score 0 0 0  Altered sleeping 0    Tired, decreased energy 0    Change in appetite 0    Feeling bad or failure about yourself  0    Trouble concentrating 0    Moving slowly or fidgety/restless 0    Suicidal thoughts 0    PHQ-9 Score 0    Difficult doing work/chores Not difficult at all         10/09/2022   10:56 AM  GAD 7 : Generalized Anxiety Score  Nervous, Anxious, on Edge 2  Control/stop worrying 1  Worry too much - different things 2  Trouble relaxing 3  Restless 2  Easily annoyed or irritable 1  Afraid - awful might happen 1  Total GAD 7 Score 12  Anxiety Difficulty Somewhat difficult  She has xanax.  Upstream - 10/09/22 1051       Pregnancy Intention Screening   Does the patient want to become pregnant in the next year? N/A    Does the patient's partner want to become pregnant in the next year? N/A    Would the patient like to discuss contraceptive options today? N/A      Contraception Wrap Up   Current Method Female Sterilization    End Method Female Sterilization    Contraception Counseling Provided No  Examination chaperoned by Malachy Mood LPN   Impression and Plan: 1. Encounter for gynecological examination with Papanicolaou smear of cervix Pap sent Pap in 3 years if normal Physical with PCP Labs with PCP Mammogram was negative 09/16/22 Colonoscopy 10/2 - Cytology - PAP( [facility])  2. Encounter for screening fecal occult blood testing Hemoccult was negative  - POCT occult blood stool  3. Essential hypertension Follow up with PCP on BP

## 2022-10-14 LAB — CYTOLOGY - PAP
Comment: NEGATIVE
Diagnosis: NEGATIVE
High risk HPV: NEGATIVE

## 2022-10-23 ENCOUNTER — Ambulatory Visit (HOSPITAL_COMMUNITY): Payer: 59 | Admitting: Anesthesiology

## 2022-10-23 ENCOUNTER — Encounter (INDEPENDENT_AMBULATORY_CARE_PROVIDER_SITE_OTHER): Payer: Self-pay | Admitting: *Deleted

## 2022-10-23 ENCOUNTER — Ambulatory Visit (HOSPITAL_BASED_OUTPATIENT_CLINIC_OR_DEPARTMENT_OTHER): Payer: 59 | Admitting: Anesthesiology

## 2022-10-23 ENCOUNTER — Other Ambulatory Visit: Payer: Self-pay

## 2022-10-23 ENCOUNTER — Encounter (HOSPITAL_COMMUNITY)
Admission: RE | Disposition: A | Discharge status: Home / Self Care | Payer: Self-pay | Source: Home / Self Care | Attending: Gastroenterology

## 2022-10-23 ENCOUNTER — Ambulatory Visit (HOSPITAL_COMMUNITY)
Admission: RE | Admit: 2022-10-23 | Discharge: 2022-10-23 | Disposition: A | Discharge status: Home / Self Care | Payer: 59 | Attending: Gastroenterology | Admitting: Gastroenterology

## 2022-10-23 ENCOUNTER — Encounter (HOSPITAL_COMMUNITY): Payer: Self-pay

## 2022-10-23 DIAGNOSIS — K644 Residual hemorrhoidal skin tags: Secondary | ICD-10-CM | POA: Diagnosis not present

## 2022-10-23 DIAGNOSIS — D127 Benign neoplasm of rectosigmoid junction: Secondary | ICD-10-CM | POA: Insufficient documentation

## 2022-10-23 DIAGNOSIS — B3781 Candidal esophagitis: Secondary | ICD-10-CM

## 2022-10-23 DIAGNOSIS — K229 Disease of esophagus, unspecified: Secondary | ICD-10-CM

## 2022-10-23 DIAGNOSIS — R195 Other fecal abnormalities: Secondary | ICD-10-CM

## 2022-10-23 DIAGNOSIS — D125 Benign neoplasm of sigmoid colon: Secondary | ICD-10-CM | POA: Diagnosis not present

## 2022-10-23 DIAGNOSIS — Z800 Family history of malignant neoplasm of digestive organs: Secondary | ICD-10-CM | POA: Diagnosis not present

## 2022-10-23 DIAGNOSIS — K2970 Gastritis, unspecified, without bleeding: Secondary | ICD-10-CM

## 2022-10-23 DIAGNOSIS — K319 Disease of stomach and duodenum, unspecified: Secondary | ICD-10-CM | POA: Diagnosis not present

## 2022-10-23 DIAGNOSIS — K219 Gastro-esophageal reflux disease without esophagitis: Secondary | ICD-10-CM | POA: Insufficient documentation

## 2022-10-23 DIAGNOSIS — Z79899 Other long term (current) drug therapy: Secondary | ICD-10-CM | POA: Diagnosis not present

## 2022-10-23 DIAGNOSIS — M5412 Radiculopathy, cervical region: Secondary | ICD-10-CM | POA: Diagnosis not present

## 2022-10-23 DIAGNOSIS — Z7982 Long term (current) use of aspirin: Secondary | ICD-10-CM | POA: Diagnosis not present

## 2022-10-23 DIAGNOSIS — I10 Essential (primary) hypertension: Secondary | ICD-10-CM | POA: Insufficient documentation

## 2022-10-23 DIAGNOSIS — K3189 Other diseases of stomach and duodenum: Secondary | ICD-10-CM | POA: Insufficient documentation

## 2022-10-23 DIAGNOSIS — K648 Other hemorrhoids: Secondary | ICD-10-CM | POA: Insufficient documentation

## 2022-10-23 DIAGNOSIS — K635 Polyp of colon: Secondary | ICD-10-CM | POA: Diagnosis not present

## 2022-10-23 DIAGNOSIS — Z1211 Encounter for screening for malignant neoplasm of colon: Secondary | ICD-10-CM | POA: Insufficient documentation

## 2022-10-23 HISTORY — DX: Anxiety disorder, unspecified: F41.9

## 2022-10-23 HISTORY — PX: POLYPECTOMY: SHX5525

## 2022-10-23 HISTORY — PX: BIOPSY: SHX5522

## 2022-10-23 HISTORY — PX: ESOPHAGEAL BRUSHING: SHX6842

## 2022-10-23 HISTORY — PX: ESOPHAGOGASTRODUODENOSCOPY (EGD) WITH PROPOFOL: SHX5813

## 2022-10-23 HISTORY — PX: COLONOSCOPY WITH PROPOFOL: SHX5780

## 2022-10-23 LAB — KOH PREP

## 2022-10-23 LAB — HM COLONOSCOPY

## 2022-10-23 SURGERY — COLONOSCOPY WITH PROPOFOL
Anesthesia: General

## 2022-10-23 MED ORDER — LACTATED RINGERS IV SOLN: INTRAVENOUS | Status: DC

## 2022-10-23 MED ORDER — PROPOFOL 10 MG/ML IV BOLUS
INTRAVENOUS | Status: DC | PRN
  Administered 2022-10-23: 50 mg via INTRAVENOUS
  Administered 2022-10-23: 25 mg via INTRAVENOUS
  Administered 2022-10-23: 75 mg via INTRAVENOUS
  Administered 2022-10-23: 30 mg via INTRAVENOUS
  Administered 2022-10-23: 20 mg via INTRAVENOUS
  Administered 2022-10-23: 25 mg via INTRAVENOUS
  Administered 2022-10-23: 75 mg via INTRAVENOUS
  Administered 2022-10-23: 30 mg via INTRAVENOUS

## 2022-10-23 MED ORDER — PROPOFOL 500 MG/50ML IV EMUL
INTRAVENOUS | Status: DC | PRN
  Administered 2022-10-23: 150 ug/kg/min via INTRAVENOUS

## 2022-10-23 MED ORDER — LIDOCAINE HCL 1 % IJ SOLN
INTRAMUSCULAR | Status: DC | PRN
  Administered 2022-10-23: 50 mg via INTRADERMAL

## 2022-10-23 NOTE — Op Note (Signed)
[facility] Patient Name: Felicia Greer Procedure Date: 10/23/2022 10:13 AM MRN: [id number] Date of Birth: 1960/09/14 Attending MD: Sanjuan Dame , MD, [id number] CSN: [id number] Age: 62 Admit Type: Outpatient Procedure:                Upper GI endoscopy Indications:              Heartburn, Suspected gastro-esophageal reflux                            disease Providers:                Sanjuan Dame, MD, Sheran Fava, Pandora Leiter, Technician Referring MD:             Sanjuan Dame, MD Medicines:                Monitored Anesthesia Care Complications:            No immediate complications. Estimated Blood Loss:      Procedure:                Pre-Anesthesia Assessment:                           - Prior to the procedure, a History and Physical                            was performed, and patient medications and                            allergies were reviewed. The patient's tolerance of                            previous anesthesia was also reviewed. The risks                            and benefits of the procedure and the sedation                            options and risks were discussed with the patient.                            All questions were answered, and informed consent                            was obtained. Prior Anticoagulants: The patient has                            taken no anticoagulant or antiplatelet agents. ASA                            Grade Assessment: II - A patient with mild systemic                            disease. After reviewing the  risks and benefits,                            the patient was deemed in satisfactory condition to                            undergo the procedure.                           After obtaining informed consent, the endoscope was                            passed under direct vision. Throughout the                            procedure, the patient's blood pressure, pulse, and                             oxygen saturations were monitored continuously. The                            [id number]-H190 ([id number]) scope was introduced through the                            mouth, and advanced to the second part of duodenum.                            The upper GI endoscopy was accomplished without                            difficulty. The patient tolerated the procedure                            well. Scope In: 10:27:09 AM Scope Out: 10:35:16 AM Total Procedure Duration: 0 hours 8 minutes 7 seconds  Findings:      Diffuse, white plaques were found in the entire esophagus. Brushings for       KOH prep were obtained in the lower third of the esophagus.      A few small erosions with no bleeding and no stigmata of recent bleeding       were found in the stomach. Biopsies were taken with a cold forceps for       histology.      The duodenal bulb and second portion of the duodenum were normal. Impression:               - Esophageal plaques were found, suspicious for                            candidiasis. Brushings performed.                           - Erosive gastropathy with no bleeding and no                            stigmata of recent bleeding. Biopsied.                           -  Normal duodenal bulb and second portion of the                            duodenum. Moderate Sedation:      Per Anesthesia Care Recommendation:           - Patient has a contact number available for                            emergencies. The signs and symptoms of potential                            delayed complications were discussed with the                            patient. Return to normal activities tomorrow.                            Written discharge instructions were provided to the                            patient.                           - Resume previous diet.                           - Continue present medications.                           - Await pathology  results. Procedure Code(s):        --- Professional ---                           [phone], Esophagogastroduodenoscopy, flexible,                            transoral; with biopsy, single or multiple Diagnosis Code(s):        --- Professional ---                           K22.9, Disease of esophagus, unspecified                           K31.89, Other diseases of stomach and duodenum                           R12, Heartburn CPT copyright 2022 American Medical Association. All rights reserved. The codes documented in this report are preliminary and upon coder review may  be revised to meet current compliance requirements. Sanjuan Dame, MD Sanjuan Dame, MD 10/23/2022 10:39:21 AM This report has been signed electronically. Number of Addenda: 0

## 2022-10-23 NOTE — Interval H&P Note (Signed)
History and Physical Interval Note:  10/23/2022 10:04 AM  Felicia Greer  has presented today for surgery, with the diagnosis of positive cologuard, gerd.  The various methods of treatment have been discussed with the patient and family. After consideration of risks, benefits and other options for treatment, the patient has consented to  Procedure(s) with comments: COLONOSCOPY WITH PROPOFOL (N/A) - 10:45am;asa 2 ESOPHAGOGASTRODUODENOSCOPY (EGD) WITH PROPOFOL (N/A) - 10:45am;asa 2 as a surgical intervention.  The patient's history has been reviewed, patient examined, no change in status, stable for surgery.  I have reviewed the patient's chart and labs.  Questions were answered to the patient's satisfaction.     Juanetta Beets Shaquira Moroz

## 2022-10-23 NOTE — Discharge Instructions (Signed)

## 2022-10-23 NOTE — Anesthesia Postprocedure Evaluation (Signed)
Anesthesia Post Note  Patient: Felicia Greer  Procedure(s) Performed: COLONOSCOPY WITH PROPOFOL ESOPHAGOGASTRODUODENOSCOPY (EGD) WITH PROPOFOL BIOPSY ESOPHAGEAL BRUSHING POLYPECTOMY  Patient location during evaluation: PACU Anesthesia Type: General Level of consciousness: awake and alert Pain management: pain level controlled Vital Signs Assessment: post-procedure vital signs reviewed and stable Respiratory status: spontaneous breathing, nonlabored ventilation, respiratory function stable and patient connected to nasal cannula oxygen Cardiovascular status: blood pressure returned to baseline and stable Postop Assessment: no apparent nausea or vomiting Anesthetic complications: no   There were no known notable events for this encounter.   Last Vitals:  Vitals:   10/23/22 0940 10/23/22 1121  BP: 134/87 137/75  Pulse: 88 83  Resp: (!) 22 20  Temp: 36.7 C 36.4 C  SpO2: 97% 100%    Last Pain:  Vitals:   10/23/22 1121  TempSrc: Oral  PainSc: 0-No pain                 Shakirah Kirkey L Vadhir Mcnay

## 2022-10-23 NOTE — Anesthesia Preprocedure Evaluation (Addendum)
Anesthesia Evaluation  Patient identified by MRN, date of birth, ID band Patient awake    Reviewed: Allergy & Precautions, H&P , NPO status , Patient's Chart, lab work & pertinent test results, reviewed documented beta blocker date and time   Airway Mallampati: II  TM Distance: >3 FB Neck ROM: full    Dental no notable dental hx. 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  Has a partial.:   Pulmonary COPD, Current Smoker and Patient abstained from smoking.   Pulmonary exam normal breath sounds clear to auscultation       Cardiovascular Exercise Tolerance: Good hypertension, Normal cardiovascular exam Rhythm:regular Rate:Normal     Neuro/Psych  Headaches PSYCHIATRIC DISORDERS Anxiety Depression       GI/Hepatic Neg liver ROS,GERD  ,,  Endo/Other  negative endocrine ROS    Renal/GU negative Renal ROS  negative genitourinary   Musculoskeletal   Abdominal   Peds  Hematology negative hematology ROS (+)   Anesthesia Other Findings   Reproductive/Obstetrics negative OB ROS                             Anesthesia Physical Anesthesia Plan  ASA: 2  Anesthesia Plan: General   Post-op Pain Management: Minimal or no pain anticipated   Induction: Intravenous  PONV Risk Score and Plan: Propofol infusion  Airway Management Planned: Nasal Cannula and Natural Airway  Additional Equipment: None  Intra-op Plan:   Post-operative Plan:   Informed Consent: I have reviewed the patients History and Physical, chart, labs and discussed the procedure including the risks, benefits and alternatives for the proposed anesthesia with the patient or authorized representative who has indicated his/her understanding and acceptance.     Dental Advisory Given  Plan Discussed with: CRNA  Anesthesia Plan Comments:        Anesthesia Quick Evaluation

## 2022-10-23 NOTE — Op Note (Signed)
[facility] Patient Name: Felicia Greer Procedure Date: 10/23/2022 10:12 AM MRN: [id number] Date of Birth: [birth year] Attending MD: Sanjuan Dame , MD, [id number] CSN: [id number] Age: 62 Admit Type: Outpatient Procedure:                Colonoscopy Indications:              Screening in patient at increased risk: Family                            history of 1st-degree relative with colorectal                            cancer before age 63 years Providers:                Sanjuan Dame, MD, Sheran Fava, Pandora Leiter, Technician Referring MD:             Sanjuan Dame, MD Medicines:                Monitored Anesthesia Care Complications:            No immediate complications. Estimated Blood Loss:     Estimated blood loss was minimal. Procedure:                Pre-Anesthesia Assessment:                           - Prior to the procedure, a History and Physical                            was performed, and patient medications and                            allergies were reviewed. The patient's tolerance of                            previous anesthesia was also reviewed. The risks                            and benefits of the procedure and the sedation                            options and risks were discussed with the patient.                            All questions were answered, and informed consent                            was obtained. Prior Anticoagulants: The patient has                            taken no anticoagulant or antiplatelet agents  except for aspirin. ASA Grade Assessment: II - A                            patient with mild systemic disease. After reviewing                            the risks and benefits, the patient was deemed in                            satisfactory condition to undergo the procedure.                           After obtaining informed consent, the colonoscope                             was passed under direct vision. Throughout the                            procedure, the patient's blood pressure, pulse, and                            oxygen saturations were monitored continuously. The                            [id number]) scope was introduced through the                            anus and advanced to the the cecum, identified by                            appendiceal orifice and ileocecal valve. The                            colonoscopy was performed without difficulty. The                            patient tolerated the procedure well. The quality                            of the bowel preparation was evaluated using the                            BBPS [facility] Bowel Preparation Scale) with scores                            of: Right Colon = 3, Transverse Colon = 3 and Left                            Colon = 3 (entire mucosa seen well with no residual                            staining, small fragments of stool or opaque  liquid). The total BBPS score equals 9. The                            ileocecal valve, appendiceal orifice, and rectum                            were photographed. Scope In: 10:42:04 AM Scope Out: 11:14:08 AM Scope Withdrawal Time: 0 hours 24 minutes 4 seconds  Total Procedure Duration: 0 hours 32 minutes 4 seconds  Findings:      The perianal and digital rectal examinations were normal.      Four sessile polyps were found in the recto-sigmoid colon and sigmoid       colon. The polyps were 3 to 13 mm in size. These polyps were removed       with a cold snare. Resection and retrieval were complete.      Non-bleeding external and internal hemorrhoids were found during       retroflexion. The hemorrhoids were small. Impression:               - Four 3 to 13 mm polyps at the recto-sigmoid colon                            and in the sigmoid colon, removed with a cold                            snare.  Resected and retrieved.                           - Non-bleeding external and internal hemorrhoids. Moderate Sedation:      Per Anesthesia Care Recommendation:           - Patient has a contact number available for                            emergencies. The signs and symptoms of potential                            delayed complications were discussed with the                            patient. Return to normal activities tomorrow.                            Written discharge instructions were provided to the                            patient.                           - Resume previous diet.                           - Continue present medications.                           - Await pathology results.                           -  Repeat colonoscopy in 3 - 5 years for                            surveillance.                           - Return to primary care physician as previously                            scheduled. Procedure Code(s):        --- Professional ---                           [phone], Colonoscopy, flexible; with removal of                            tumor(s), polyp(s), or other lesion(s) by snare                            technique Diagnosis Code(s):        --- Professional ---                           Z80.0, Family history of malignant neoplasm of                            digestive organs                           D12.7, Benign neoplasm of rectosigmoid junction                           D12.5, Benign neoplasm of sigmoid colon                           K64.8, Other hemorrhoids CPT copyright 2022 American Medical Association. All rights reserved. The codes documented in this report are preliminary and upon coder review may  be revised to meet current compliance requirements. Sanjuan Dame, MD Sanjuan Dame, MD 10/23/2022 11:22:45 AM This report has been signed electronically. Number of Addenda: 0

## 2022-10-23 NOTE — Transfer of Care (Signed)
Immediate Anesthesia Transfer of Care Note  Patient: Felicia Greer  Procedure(s) Performed: COLONOSCOPY WITH PROPOFOL ESOPHAGOGASTRODUODENOSCOPY (EGD) WITH PROPOFOL BIOPSY ESOPHAGEAL BRUSHING POLYPECTOMY  Patient Location: Endoscopy Unit  Anesthesia Type:General  Level of Consciousness: awake  Airway & Oxygen Therapy: Patient Spontanous Breathing  Post-op Assessment: Report given to RN  Post vital signs: Reviewed and stable  Last Vitals:  Vitals Value Taken Time  BP    Temp    Pulse    Resp    SpO2      Last Pain:  Vitals:   10/23/22 1023  TempSrc:   PainSc: 0-No pain      Patients Stated Pain Goal: 5 (10/23/22 0926)  Complications: No notable events documented.

## 2022-10-28 ENCOUNTER — Other Ambulatory Visit (HOSPITAL_COMMUNITY): Payer: Self-pay | Admitting: Gastroenterology

## 2022-10-28 DIAGNOSIS — Z681 Body mass index (BMI) 19 or less, adult: Secondary | ICD-10-CM | POA: Diagnosis not present

## 2022-10-28 DIAGNOSIS — B3781 Candidal esophagitis: Secondary | ICD-10-CM

## 2022-10-28 DIAGNOSIS — G8929 Other chronic pain: Secondary | ICD-10-CM | POA: Diagnosis not present

## 2022-10-28 LAB — SURGICAL PATHOLOGY

## 2022-10-28 MED ORDER — FLUCONAZOLE 200 MG PO TABS
ORAL_TABLET | ORAL | 0 refills | Status: DC
Start: 2022-10-28 — End: 2022-12-23

## 2022-10-28 NOTE — Progress Notes (Signed)
I reviewed the pathology results. Ann, can you send her a letter with the findings as described below please? Repeat colonoscopy in 5 years  Thanks,  Vista Lawman, MD Gastroenterology and Hepatology [facility]  ---------------------------------------------------------------------------------------------  [facility] [address], [place], [place] [address] Phone:  [phone]   10/28/22 [place], [place]   Dear Felicia Greer,  I am writing to inform you that the biopsies taken during your recent endoscopic examination showed:  Yeast seen on the brushing which is consistent with Candida in your food pipe  No H. Pylori bacteria in stomach , or any early cancer changes to the stomach mucosa ( Intestinal metaplasia)   I am writing to let you know the results of your recent colonoscopy.  You had a total of 4 polyps removed. The pathology came back as "hyperplastic polyp." These findings are NOT cancer and do not turn into cancer. Given these findings, it is recommended that your next colonoscopy be performed in 5 years.  Medication  For Candida infection in esophagus  Recommend starting Fluconazole 400 mg once ( Day one)  than  200 mg daily for 20 days  Please call us at [phone] if you have persistent problems or have questions about your condition that have not been fully answered at this time.  Sincerely,  Vista Lawman, MD Gastroenterology and Hepatology

## 2022-10-30 ENCOUNTER — Encounter (INDEPENDENT_AMBULATORY_CARE_PROVIDER_SITE_OTHER): Payer: Self-pay | Admitting: *Deleted

## 2022-10-31 ENCOUNTER — Encounter (HOSPITAL_COMMUNITY): Payer: Self-pay | Admitting: Gastroenterology

## 2022-12-02 DIAGNOSIS — R109 Unspecified abdominal pain: Secondary | ICD-10-CM | POA: Diagnosis not present

## 2022-12-02 DIAGNOSIS — I10 Essential (primary) hypertension: Secondary | ICD-10-CM | POA: Diagnosis not present

## 2022-12-02 DIAGNOSIS — M5412 Radiculopathy, cervical region: Secondary | ICD-10-CM | POA: Diagnosis not present

## 2022-12-02 DIAGNOSIS — G894 Chronic pain syndrome: Secondary | ICD-10-CM | POA: Diagnosis not present

## 2022-12-03 ENCOUNTER — Other Ambulatory Visit (HOSPITAL_COMMUNITY): Payer: Self-pay | Admitting: Internal Medicine

## 2022-12-03 DIAGNOSIS — R109 Unspecified abdominal pain: Secondary | ICD-10-CM

## 2022-12-12 ENCOUNTER — Ambulatory Visit (HOSPITAL_COMMUNITY)
Admission: RE | Admit: 2022-12-12 | Discharge: 2022-12-12 | Disposition: A | Discharge status: Home / Self Care | Payer: 59 | Source: Ambulatory Visit | Attending: Internal Medicine | Admitting: Internal Medicine

## 2022-12-12 DIAGNOSIS — R109 Unspecified abdominal pain: Secondary | ICD-10-CM | POA: Diagnosis not present

## 2022-12-12 DIAGNOSIS — Q631 Lobulated, fused and horseshoe kidney: Secondary | ICD-10-CM | POA: Diagnosis not present

## 2022-12-23 ENCOUNTER — Ambulatory Visit: Payer: 59 | Attending: Internal Medicine | Admitting: Internal Medicine

## 2022-12-23 ENCOUNTER — Encounter: Payer: Self-pay | Admitting: Internal Medicine

## 2022-12-23 VITALS — BP 134/78 | HR 65 | Ht 64.0 in | Wt 116.6 lb

## 2022-12-23 DIAGNOSIS — Z01810 Encounter for preprocedural cardiovascular examination: Secondary | ICD-10-CM

## 2022-12-23 DIAGNOSIS — R0789 Other chest pain: Secondary | ICD-10-CM | POA: Insufficient documentation

## 2022-12-23 DIAGNOSIS — R0602 Shortness of breath: Secondary | ICD-10-CM | POA: Insufficient documentation

## 2022-12-23 DIAGNOSIS — R079 Chest pain, unspecified: Secondary | ICD-10-CM | POA: Insufficient documentation

## 2022-12-23 MED ORDER — AMLODIPINE BESYLATE 5 MG PO TABS: 5.0000 mg | ORAL_TABLET | Freq: Every day | ORAL | 3 refills | Status: DC

## 2022-12-23 MED ORDER — AMLODIPINE BESYLATE 5 MG PO TABS: 5.0000 mg | ORAL_TABLET | Freq: Two times a day (BID) | ORAL | 3 refills | Status: AC

## 2022-12-23 MED ORDER — METOPROLOL TARTRATE 100 MG PO TABS: 100.0000 mg | ORAL_TABLET | Freq: Once | ORAL | 0 refills | Status: AC

## 2022-12-23 NOTE — Patient Instructions (Addendum)
Medication Instructions:  Your physician has recommended you make the following change in your medication:  Increase Amlodipine from 2.5 mg to 5 mg twice daily Continue taking all other medications as prescribed  Labwork: BMET 1-2 weeks prior to Coronary CTA at [facility]  Testing/Procedures: Your physician has requested that you have an echocardiogram. Echocardiography is a painless test that uses sound waves to create images of your heart. It provides your doctor with information about the size and shape of your heart and how well your heart's chambers and valves are working. This procedure takes approximately one hour. There are no restrictions for this procedure. Please do NOT wear cologne, perfume, aftershave, or lotions (deodorant is allowed). Please arrive 15 minutes prior to your appointment time.  Please note: We ask at that you not bring children with you during ultrasound (echo/ vascular) testing. Due to room size and safety concerns, children are not allowed in the ultrasound rooms during exams. Our front office staff cannot provide observation of children in our lobby area while testing is being conducted. An adult accompanying a patient to their appointment will only be allowed in the ultrasound room at the discretion of the ultrasound technician under special circumstances. We apologize for any inconvenience.    Your cardiac CT will be scheduled at one of the below locations:   [facility] [address] [place], [place] [address] [phone]  If scheduled at [facility], please arrive at the [facility] and Children's Entrance (Entrance C2) of [facility] 30 minutes prior to test start time. You can use the FREE valet parking offered at entrance C (encouraged to control the heart rate for the test)  Proceed to the [facility] Radiology Department (first floor) to check-in and test prep.  All radiology patients and guests should use entrance C2 at  [facility], accessed from [facility], even though the hospital's physical address listed is [address].    There is spacious parking and easy access to the radiology department from the [facility] V[facility] entrance. Please enter here and check-in with the desk attendant.   Please follow these instructions carefully (unless otherwise directed):  An IV will be required for this test and Nitroglycerin will be given.   On the Night Before the Test: Be sure to Drink plenty of water. Do not consume any caffeinated/decaffeinated beverages or chocolate 12 hours prior to your test. Do not take any antihistamines 12 hours prior to your test. If the patient has contrast allergy: No allergy  On the Day of the Test: Drink plenty of water until 1 hour prior to the test. Do not eat any food 1 hour prior to test. You may take your regular medications prior to the test.  Take metoprolol 100 mg(Lopressor) two hours prior to test. FEMALES- please wear underwire-free bra if available, avoid dresses & tight clothing   After the Test: Drink plenty of water. After receiving IV contrast, you may experience a mild flushed feeling. This is normal. On occasion, you may experience a mild rash up to 24 hours after the test. This is not dangerous. If this occurs, you can take Benadryl 25 mg and increase your fluid intake. If you experience trouble breathing, this can be serious. If it is severe call 911 IMMEDIATELY. If it is mild, please call our office.  We will call to schedule your test 2-4 weeks out understanding that some insurance companies will need an authorization prior to  the service being performed.   For more information and frequently asked questions, please visit our website : [email or web address]  For non-scheduling related questions, please contact the cardiac imaging nurse navigator should you have any questions/concerns: Cardiac Imaging Nurse  Navigators Direct Office Dial: [phone]   For scheduling needs, including cancellations and rescheduling, please call [place], [phone].   Follow-Up: Your physician recommends that you schedule a follow-up appointment in: 6 months  Any Other Special Instructions Will Be Listed Below (If Applicable).  Thank you for choosing [facility]!      If you need a refill on your cardiac medications before your next appointment, please call your pharmacy.

## 2022-12-23 NOTE — Progress Notes (Signed)
Cardiology Office Note  Date: 12/23/2022   ID: Felicia Greer, DOB 1960/12/24, MRN [id number]  PCP:  Elfredia Nevins, MD  Cardiologist:  None Electrophysiologist:  None   History of Present Illness: Felicia Greer is a 62 y.o. female known to have GERD, COPD, HTN was referred to cardiology clinic for evaluation of chest pains.  CT cardiac from 2013 showed coronary calcium score 0 and findings consistent with reflux esophagitis.  Echocardiogram from 2020 showed normal LVEF, indeterminate diastology, normal RV function, normal atrial size.  She is here for new patient visit.  She started to have chest pains associated with SOB x 3 months, no relation with rest or exercise, last for about 20 minutes and resolves eventually.  She also noticed having elevated blood pressures for quite a while, recently around 170 to 200 mmHg SBP.  She was initially started on amlodipine and noticed her blood pressure is dropping, her PCP cut back on the dose.   Past Medical History:  Diagnosis Date   Acid reflux    Anxiety    Arthritis    pelvic bone, neck   Back pain    BV (bacterial vaginosis) 05/09/2010   COPD (chronic obstructive pulmonary disease) (HCC) 2010   Dyspareunia, female 01/05/2015   Hx of goiter    Hx: UTI (urinary tract infection)    Hypertension    Osteopenia determined by x-ray 03/10/2014   Pelvic pain in female 03/01/2014   Rhinitis 02/15/2014   Urinary frequency 01/05/2015   Vaginal odor 01/05/2015    Past Surgical History:  Procedure Laterality Date   BIOPSY  10/23/2022   Procedure: BIOPSY;  Surgeon: Franky Macho, MD;  [facility]: [facility];  Service: Endoscopy;;   BIOPSY THYROID N/A 06/2009   had fna   BREAST BIOPSY Right in the 1990s   BREAST BIOPSY  1980   benign   CERVICAL FUSION N/A in the 1990s   COLONOSCOPY WITH PROPOFOL N/A 10/23/2022   Procedure: COLONOSCOPY WITH PROPOFOL;  Surgeon: Franky Macho, MD;  Location: [facility];  Service: Endoscopy;   Laterality: N/A;  10:45am;asa 2   ESOPHAGEAL BRUSHING  10/23/2022   Procedure: ESOPHAGEAL BRUSHING;  Surgeon: Franky Macho, MD;  [facility]: [facility];  Service: Endoscopy;;   ESOPHAGOGASTRODUODENOSCOPY (EGD) WITH PROPOFOL N/A 10/23/2022   Procedure: ESOPHAGOGASTRODUODENOSCOPY (EGD) WITH PROPOFOL;  Surgeon: Franky Macho, MD;  [facility]: [facility];  Service: Endoscopy;  Laterality: N/A;  10:45am;asa 2   HYSTEROSCOPY W/ ENDOMETRIAL ABLATION N/A 2004   POLYPECTOMY  10/23/2022   Procedure: POLYPECTOMY;  Surgeon: Franky Macho, MD;  Location: [facility];  Service: Endoscopy;;   TUBAL LIGATION      Current Outpatient Medications  Medication Sig Dispense Refill   ALPRAZolam (XANAX) 1 MG tablet Take 1 tablet by mouth at bedtime. *May take 1 tablet three times daily as needed for anxiety  0   amLODipine (NORVASC) 2.5 MG tablet Take 2.5 mg by mouth in the morning and at bedtime.     aspirin 81 MG chewable tablet Chew by mouth daily.     dicyclomine (BENTYL) 10 MG capsule TAKE 1 CAPSULE BY MOUTH FOUR TIMES DAILY AS NEEDED FOR ABDOMINAL CRAMPS AND PAIN Oral for 30 Days     Erenumab-aooe (AIMOVIG) 140 MG/ML SOAJ Inject 140 mg into the skin every 3 (three) months. 1.12 mL 11   HYDROcodone-acetaminophen (NORCO) 10-325 MG tablet Take 1 tablet by mouth every 6 (six) hours as needed.  Melatonin 10 MG TABS Take 10 mg by mouth daily.     Multiple Vitamin (MULTIVITAMIN) tablet Take 1 tablet by mouth daily.     nortriptyline (PAMELOR) 50 MG capsule Take 1 capsule (50 mg total) by mouth at bedtime. 90 capsule 6   ondansetron (ZOFRAN) 4 MG tablet Take 1 tablet (4 mg total) by mouth every 6 (six) hours as needed for nausea or vomiting (migraine). 20 tablet 11   oxybutynin (DITROPAN) 5 MG tablet Take 5 mg by mouth at bedtime.     Ubrogepant (UBRELVY) 100 MG TABS Take one tablet onset migraine and another tablet in 2 hours as needed  (max 2/24hr) 16 tablet 5   venlafaxine XR (EFFEXOR XR) 37.5 MG  24 hr capsule Take 1 capsule (37.5 mg total) by mouth at bedtime. 90 capsule 3   No current facility-administered medications for this visit.   Allergies:  Codeine, Metrogel [metronidazole], and Protonix [pantoprazole sodium]   Social History: The patient  reports that she has been smoking cigarettes. She started smoking about 50 years ago. She has a 50.2 pack-year smoking history. She has been exposed to tobacco smoke. She has never used smokeless tobacco. She reports that she does not drink alcohol and does not use drugs.   Family History: The patient's family history includes Anxiety disorder in her brother and daughter; Breast cancer in her maternal aunt, maternal grandmother, and another family member; COPD in her father; Cancer in her maternal aunt and another family member; Colon cancer in her mother; Emphysema in her maternal grandfather; Fibromyalgia in her mother; Heart attack in her father, paternal grandmother, and sister; Migraines in her maternal aunt and another family member; Other in her brother, father, mother, and sister.   ROS:  Please see the history of present illness. Otherwise, complete review of systems is positive for none  All other systems are reviewed and negative.   Physical Exam: VS:  BP 134/78   Pulse 65   Ht 5\' 4"  (1.626 m)   Wt 116 lb 9.6 oz (52.9 kg)   SpO2 99%   BMI 20.01 kg/m , BMI Body mass index is 20.01 kg/m.  Wt Readings from Last 3 Encounters:  12/23/22 116 lb 9.6 oz (52.9 kg)  10/09/22 118 lb 8 oz (53.8 kg)  10/08/22 119 lb 8 oz (54.2 kg)    General: Patient appears comfortable at rest. HEENT: Conjunctiva and lids normal, oropharynx clear with moist mucosa. Neck: Supple, no elevated JVP or carotid bruits, no thyromegaly. Lungs: Clear to auscultation, nonlabored breathing at rest. Cardiac: Regular rate and rhythm, no S3 or significant systolic murmur, no pericardial rub. Abdomen: Soft, nontender, no hepatomegaly, bowel sounds present, no  guarding or rebound. Extremities: No pitting edema, distal pulses 2+. Skin: Warm and dry. Musculoskeletal: No kyphosis. Neuropsychiatric: Alert and oriented x3, affect grossly appropriate.  Recent Labwork: No results found for requested labs within last 365 days.     Component Value Date/Time   CHOL 216 (H) 12/02/2018 0258   TRIG 63 12/02/2018 0258   HDL 104 12/02/2018 0258   CHOLHDL 2.1 12/02/2018 0258   VLDL 13 12/02/2018 0258   LDLCALC 99 12/02/2018 0258    Assessment and Plan:  Chest pain likely secondary to poorly controlled HTN, rule out CAD SOB HTN, poorly controlled   -Chest pain associated with SOB x 3 months, no relation with rest or exercise, last for 20 minutes and occurs few times per week. She also appears to have some hoarseness of  voice (differential GERD however she reports quality of chest pains is different from her GERD pains). CT cardiac from 2013 showed: Calcium score 0.  Echo from 2020 showed normal LVEF and no valvular heart disease. Will repeat CT cardiac and echocardiogram.  I believe her chest pains are likely secondary to poorly controlled HTN.  Will increase amlodipine dose from 2.5 mg twice daily to 5 mg twice daily.       Medication Adjustments/Labs and Tests Ordered: Current medicines are reviewed at length with the patient today.  Concerns regarding medicines are outlined above.    Disposition:  Follow up  6 months  Signed Ariane Ditullio Verne Spurr, MD, 12/23/2022 2:40 PM    [facility] at [facility] [address] [place], [place], [place] [address]

## 2022-12-30 ENCOUNTER — Emergency Department (HOSPITAL_COMMUNITY): Payer: 59

## 2022-12-30 ENCOUNTER — Emergency Department (HOSPITAL_COMMUNITY)
Admission: EM | Admit: 2022-12-30 | Discharge: 2022-12-31 | Disposition: A | Discharge status: Home / Self Care | Payer: 59 | Attending: Emergency Medicine | Admitting: Emergency Medicine

## 2022-12-30 ENCOUNTER — Encounter (HOSPITAL_COMMUNITY): Payer: Self-pay

## 2022-12-30 ENCOUNTER — Other Ambulatory Visit: Payer: Self-pay

## 2022-12-30 DIAGNOSIS — Z7982 Long term (current) use of aspirin: Secondary | ICD-10-CM | POA: Diagnosis not present

## 2022-12-30 DIAGNOSIS — I10 Essential (primary) hypertension: Secondary | ICD-10-CM | POA: Insufficient documentation

## 2022-12-30 DIAGNOSIS — Z79899 Other long term (current) drug therapy: Secondary | ICD-10-CM | POA: Diagnosis not present

## 2022-12-30 DIAGNOSIS — R079 Chest pain, unspecified: Secondary | ICD-10-CM | POA: Diagnosis not present

## 2022-12-30 DIAGNOSIS — J449 Chronic obstructive pulmonary disease, unspecified: Secondary | ICD-10-CM | POA: Diagnosis not present

## 2022-12-30 DIAGNOSIS — R0789 Other chest pain: Secondary | ICD-10-CM | POA: Diagnosis not present

## 2022-12-30 LAB — CBC
HCT: 39.8 % (ref 36.0–46.0)
Hemoglobin: 13.1 g/dL (ref 12.0–15.0)
MCH: 30.2 pg (ref 26.0–34.0)
MCHC: 32.9 g/dL (ref 30.0–36.0)
MCV: 91.7 fL (ref 80.0–100.0)
Platelets: 349 10*3/uL (ref 150–400)
RBC: 4.34 MIL/uL (ref 3.87–5.11)
RDW: 14 % (ref 11.5–15.5)
WBC: 8 10*3/uL (ref 4.0–10.5)
nRBC: 0 % (ref 0.0–0.2)

## 2022-12-30 LAB — BASIC METABOLIC PANEL
Anion gap: 7 (ref 5–15)
BUN: 11 mg/dL (ref 8–23)
CO2: 27 mmol/L (ref 22–32)
Calcium: 9.3 mg/dL (ref 8.9–10.3)
Chloride: 105 mmol/L (ref 98–111)
Creatinine, Ser: 0.92 mg/dL (ref 0.44–1.00)
GFR, Estimated: >60 mL/min (ref >60)
Glucose, Bld: 79 mg/dL (ref 70–99)
Potassium: 3.7 mmol/L (ref 3.5–5.1)
Sodium: 139 mmol/L (ref 135–145)

## 2022-12-30 LAB — TROPONIN I (HIGH SENSITIVITY): Troponin I (High Sensitivity): 4 ng/L (ref <18)

## 2022-12-30 NOTE — ED Triage Notes (Signed)
Pt endorses high blood pressure since yesterday morning with a systolic of 185. Since then pt states that she has been having chest pain and fatigue. Pt with cardiac hx and seen Monday where BP meds with increased.

## 2022-12-31 ENCOUNTER — Telehealth: Payer: Self-pay | Admitting: Pharmacy Technician

## 2022-12-31 ENCOUNTER — Other Ambulatory Visit (HOSPITAL_COMMUNITY): Payer: Self-pay

## 2022-12-31 ENCOUNTER — Emergency Department (HOSPITAL_COMMUNITY): Payer: 59

## 2022-12-31 DIAGNOSIS — Z0001 Encounter for general adult medical examination with abnormal findings: Secondary | ICD-10-CM | POA: Diagnosis not present

## 2022-12-31 DIAGNOSIS — G629 Polyneuropathy, unspecified: Secondary | ICD-10-CM | POA: Diagnosis not present

## 2022-12-31 DIAGNOSIS — Z1331 Encounter for screening for depression: Secondary | ICD-10-CM | POA: Diagnosis not present

## 2022-12-31 DIAGNOSIS — F419 Anxiety disorder, unspecified: Secondary | ICD-10-CM | POA: Diagnosis not present

## 2022-12-31 DIAGNOSIS — M5412 Radiculopathy, cervical region: Secondary | ICD-10-CM | POA: Diagnosis not present

## 2022-12-31 DIAGNOSIS — M47816 Spondylosis without myelopathy or radiculopathy, lumbar region: Secondary | ICD-10-CM | POA: Diagnosis not present

## 2022-12-31 DIAGNOSIS — I10 Essential (primary) hypertension: Secondary | ICD-10-CM | POA: Diagnosis not present

## 2022-12-31 DIAGNOSIS — K219 Gastro-esophageal reflux disease without esophagitis: Secondary | ICD-10-CM | POA: Diagnosis not present

## 2022-12-31 DIAGNOSIS — G894 Chronic pain syndrome: Secondary | ICD-10-CM | POA: Diagnosis not present

## 2022-12-31 DIAGNOSIS — R0789 Other chest pain: Secondary | ICD-10-CM | POA: Diagnosis not present

## 2022-12-31 DIAGNOSIS — Z6820 Body mass index (BMI) 20.0-20.9, adult: Secondary | ICD-10-CM | POA: Diagnosis not present

## 2022-12-31 DIAGNOSIS — F32A Depression, unspecified: Secondary | ICD-10-CM | POA: Diagnosis not present

## 2022-12-31 LAB — HEPATIC FUNCTION PANEL
ALT: 19 U/L (ref 0–44)
AST: 21 U/L (ref 15–41)
Albumin: 3.5 g/dL (ref 3.5–5.0)
Alkaline Phosphatase: 82 U/L (ref 38–126)
Bilirubin, Direct: <0.1 mg/dL (ref 0.0–0.2)
Total Bilirubin: 0.3 mg/dL (ref <1.2)
Total Protein: 6.3 g/dL — ABNORMAL LOW (ref 6.5–8.1)

## 2022-12-31 LAB — TROPONIN I (HIGH SENSITIVITY): Troponin I (High Sensitivity): 3 ng/L (ref <18)

## 2022-12-31 LAB — D-DIMER, QUANTITATIVE: D-Dimer, Quant: <0.27 ug{FEU}/mL (ref 0.00–0.50)

## 2022-12-31 LAB — LIPASE, BLOOD: Lipase: 24 U/L (ref 11–51)

## 2022-12-31 MED ORDER — ACETAMINOPHEN 500 MG PO TABS
1000.0000 mg | ORAL_TABLET | Freq: Once | ORAL | Status: AC
  Administered 2022-12-31: 1000 mg via ORAL
  Filled 2022-12-31: qty 2

## 2022-12-31 NOTE — ED Provider Notes (Signed)
Felicia Greer EMERGENCY DEPARTMENT AT [facility] Provider Note   CSN: [id number] Arrival date & time: 12/30/22  2130     History  Chief Complaint  Patient presents with   Chest Pain    Felicia Greer is a 62 y.o. female.  62 year old female with history of hypertension, anxiety, arthritis, COPD who presents the ER today secondary to hypertension and chest pain.  Patient states that she was asymptomatic checked her blood pressure yesterday morning noticed that it was high in the 180s.  She took her blood pressure medication and only did not seem to help so she took an extra half a dose of her blood pressure medication an hour later blood pressure seem to have improved but she had persistent fatigue and chest pressure intermittently since then.  She was worried about it last night as she also had some shallow breathing so she did not sleep much last night secondary to her worry about her heart.  Throughout the day today continued to have fatigue and difficulty concentrating and intermittent episodes of left upper chest discomfort.  No dyspnea, diaphoresis, lightheadedness, nausea, vomiting or neurologic changes.  Presents here for further evaluation secondary to the same.  Patient states that she thought that the blood pressure medication was only as needed and has not been taking it consistently every day as ordered.  She follows with urology and just saw them last week.  They increased her from 2.5-5.  Normal urination, no lower extremity edema or abdominal distention.   Chest Pain      Home Medications Prior to Admission medications   Medication Sig Start Date End Date Taking? Authorizing Provider  ALPRAZolam Prudy Feeler) 1 MG tablet Take 1 tablet by mouth at bedtime. *May take 1 tablet three times daily as needed for anxiety 11/19/14   [provider]  amLODipine (NORVASC) 5 MG tablet Take 1 tablet (5 mg total) by mouth 2 (two) times daily. 12/23/22   Mallipeddi, Vishnu P, MD   aspirin 81 MG chewable tablet Chew by mouth daily.    [provider]  dicyclomine (BENTYL) 10 MG capsule TAKE 1 CAPSULE BY MOUTH FOUR TIMES DAILY AS NEEDED FOR ABDOMINAL CRAMPS AND PAIN Oral for 30 Days    [provider]  Erenumab-aooe (AIMOVIG) 140 MG/ML SOAJ Inject 140 mg into the skin every 3 (three) months. 02/27/22   Anson Fret, MD  HYDROcodone-acetaminophen (NORCO) 10-325 MG tablet Take 1 tablet by mouth every 6 (six) hours as needed.    [provider]  Melatonin 10 MG TABS Take 10 mg by mouth daily.    [provider]  metoprolol tartrate (LOPRESSOR) 100 MG tablet Take 1 tablet (100 mg total) by mouth once for 1 dose. 2 hours prior to procedure 12/23/22 12/23/22  Mallipeddi, Vishnu P, MD  Multiple Vitamin (MULTIVITAMIN) tablet Take 1 tablet by mouth daily.    [provider]  nortriptyline (PAMELOR) 50 MG capsule Take 1 capsule (50 mg total) by mouth at bedtime. 02/27/22   Anson Fret, MD  ondansetron (ZOFRAN) 4 MG tablet Take 1 tablet (4 mg total) by mouth every 6 (six) hours as needed for nausea or vomiting (migraine). 02/27/22   Anson Fret, MD  oxybutynin (DITROPAN) 5 MG tablet Take 5 mg by mouth at bedtime. 11/10/18   [provider]  Ubrogepant (UBRELVY) 100 MG TABS Take one tablet onset migraine and another tablet in 2 hours as needed  (max 2/24hr) 08/28/22   Naomie Dean  B, MD  venlafaxine XR (EFFEXOR XR) 37.5 MG 24 hr capsule Take 1 capsule (37.5 mg total) by mouth at bedtime. 02/27/22   Anson Fret, MD      Allergies    Codeine, Metrogel [metronidazole], and Protonix [pantoprazole sodium]    Review of Systems   Review of Systems  Cardiovascular:  Positive for chest pain.    Physical Exam Updated Vital Signs BP (!) 144/88   Pulse 73   Temp 98.7 F (37.1 C) (Oral)   Resp (!) 23   SpO2 98%  Physical Exam Vitals and nursing note reviewed.  Constitutional:      Appearance: She is well-developed.  HENT:      Head: Normocephalic and atraumatic.  Cardiovascular:     Rate and Rhythm: Normal rate and regular rhythm.  Pulmonary:     Effort: No tachypnea or respiratory distress.     Breath sounds: No stridor. No decreased breath sounds, wheezing or rales.  Abdominal:     General: There is no distension.  Musculoskeletal:     Cervical back: Normal range of motion.     Right lower leg: No edema.     Left lower leg: No edema.  Skin:    General: Skin is warm and dry.  Neurological:     Mental Status: She is alert.     ED Results / Procedures / Treatments   Labs (all labs ordered are listed, but only abnormal results are displayed) Labs Reviewed  HEPATIC FUNCTION PANEL - Abnormal; Notable for the following components:      Result Value   Total Protein 6.3 (*)    All other components within normal limits  BASIC METABOLIC PANEL  CBC  LIPASE, BLOOD  D-DIMER, QUANTITATIVE  TROPONIN I (HIGH SENSITIVITY)  TROPONIN I (HIGH SENSITIVITY)    EKG EKG Interpretation Date/Time:  Monday December 30 2022 21:50:31 EST Ventricular Rate:  99 PR Interval:  138 QRS Duration:  90 QT Interval:  368 QTC Calculation: 472 R Axis:   -6  Text Interpretation: Normal sinus rhythm Biatrial enlargement RSR' or QR pattern in V1 suggests right ventricular conduction delay Nonspecific ST abnormality Abnormal ECG When compared with ECG of 23-Dec-2022 14:30, No significant change was found Slightly faster rate otherwise no significant change from dec 2 Confirmed by Marily Memos [phone]) on 12/30/2022 11:03:14 PM  Radiology CT Head Wo Contrast  Result Date: 12/31/2022 CLINICAL DATA:  Hypertension EXAM: CT HEAD WITHOUT CONTRAST TECHNIQUE: Contiguous axial images were obtained from the base of the skull through the vertex without intravenous contrast. RADIATION DOSE REDUCTION: This exam was performed according to the departmental dose-optimization program which includes automated exposure control, adjustment of the  mA and/or kV according to patient size and/or use of iterative reconstruction technique. COMPARISON:  MRI brain dated 12/02/2018 FINDINGS: Brain: No evidence of acute infarction, hemorrhage, hydrocephalus, extra-axial collection or mass lesion/mass effect. Vascular: No hyperdense vessel or unexpected calcification. Skull: Normal. Negative for fracture or focal lesion. Sinuses/Orbits: The visualized paranasal sinuses are essentially clear. The mastoid air cells are unopacified. Other: None. IMPRESSION: Normal head CT. Electronically Signed   By: Charline Bills M.D.   On: 12/31/2022 00:50   DG Chest 2 View  Result Date: 12/30/2022 CLINICAL DATA:  Right-sided chest pain EXAM: CHEST - 2 VIEW COMPARISON:  08/15/2020 FINDINGS: Frontal and lateral views of the chest demonstrate a stable cardiac silhouette. No airspace disease, effusion, or pneumothorax. No acute bony abnormalities. IMPRESSION: 1. No acute intrathoracic process. Electronically Signed  By: Sharlet Salina M.D.   On: 12/30/2022 22:29    Procedures Procedures    Medications Ordered in ED Medications  acetaminophen (TYLENOL) tablet 1,000 mg (1,000 mg Oral Given 12/31/22 0036)    ED Course/ Medical Decision Making/ A&P                                 Medical Decision Making Amount and/or Complexity of Data Reviewed Labs: ordered. Radiology: ordered.  Risk OTC drugs.   62 year old female presents today with chest pain and hypertension.  Chest pain is not really consistent with aortic dissection and D-dimer is undetectable making PE unlikely as well.  ACS was a consideration secondary to her history however she had a clean coronary CT back in 2015, normal echo in 2020, normal EKG today and normal troponins today making that unlikely.  She has follow-up already with her cardiologist in less than a month for another coronary CTA and echocardiogram.  No palpitations to suggest arrhythmia.  Symptoms, exam and x-ray not consistent with  pneumonia, pneumothorax or other emergent etiology requiring further workup or hospitalization at this time.  I did consider cardiology consult however very atypical story and negative workup thus far and close follow-up already scheduled I did not feel it was necessary.  Blood pressure was as low as 122 systolic while here.  Long discussions had with her and her husband about blood pressure control, taking medications as prescribed, signs and symptoms that require return to the hospital versus outpatient follow-up.  Will send chart to cardiologist in case they want to order any other tests or see her soone however I do not feel is necessary at this time.  Asymptomatic, reassured with normal vital signs at time of departure. Will stasrt HTN log and consistent medication administration pending cardiology follow up.   Final Clinical Impression(s) / ED Diagnoses Final diagnoses:  Nonspecific chest pain  Hypertension, unspecified type    Rx / DC Orders ED Discharge Orders     None         Amrom Ore, Barbara Cower, MD 12/31/22 [phone]

## 2022-12-31 NOTE — Telephone Encounter (Signed)
Pharmacy Patient Advocate Encounter   Received notification from CoverMyMeds that prior authorization for Ubrelvy 100MG  tablets is required/requested.   Insurance verification completed.   The patient is insured through [employer] [facility] .   Per test claim: PA required; PA submitted to above mentioned insurance via CoverMyMeds Key/confirmation #/EOC [id number] Status is pending

## 2023-01-02 DIAGNOSIS — Z7982 Long term (current) use of aspirin: Secondary | ICD-10-CM | POA: Diagnosis not present

## 2023-01-02 DIAGNOSIS — J449 Chronic obstructive pulmonary disease, unspecified: Secondary | ICD-10-CM | POA: Diagnosis not present

## 2023-01-02 DIAGNOSIS — R32 Unspecified urinary incontinence: Secondary | ICD-10-CM | POA: Diagnosis not present

## 2023-01-02 DIAGNOSIS — Z809 Family history of malignant neoplasm, unspecified: Secondary | ICD-10-CM | POA: Diagnosis not present

## 2023-01-02 DIAGNOSIS — G43909 Migraine, unspecified, not intractable, without status migrainosus: Secondary | ICD-10-CM | POA: Diagnosis not present

## 2023-01-02 DIAGNOSIS — I10 Essential (primary) hypertension: Secondary | ICD-10-CM | POA: Diagnosis not present

## 2023-01-02 DIAGNOSIS — Z87891 Personal history of nicotine dependence: Secondary | ICD-10-CM | POA: Diagnosis not present

## 2023-01-02 DIAGNOSIS — M792 Neuralgia and neuritis, unspecified: Secondary | ICD-10-CM | POA: Diagnosis not present

## 2023-01-02 DIAGNOSIS — Z888 Allergy status to other drugs, medicaments and biological substances status: Secondary | ICD-10-CM | POA: Diagnosis not present

## 2023-01-02 DIAGNOSIS — K219 Gastro-esophageal reflux disease without esophagitis: Secondary | ICD-10-CM | POA: Diagnosis not present

## 2023-01-02 DIAGNOSIS — K08409 Partial loss of teeth, unspecified cause, unspecified class: Secondary | ICD-10-CM | POA: Diagnosis not present

## 2023-01-02 DIAGNOSIS — M1990 Unspecified osteoarthritis, unspecified site: Secondary | ICD-10-CM | POA: Diagnosis not present

## 2023-01-03 ENCOUNTER — Other Ambulatory Visit (HOSPITAL_COMMUNITY): Payer: Self-pay

## 2023-01-03 NOTE — Telephone Encounter (Signed)
Pharmacy Patient Advocate Encounter  Received notification from [employer] [facility] that Prior Authorization for Ubrelvy 100MG  tablets has been APPROVED from 12/31/2022 to 12/30/2023. Unable to obtain price due to refill too soon rejection, last fill date 01/01/2023 next available fill date1/04/2023   PA #/Case ID/Reference #: PA Case ID #: [id number]

## 2023-01-24 ENCOUNTER — Ambulatory Visit (HOSPITAL_COMMUNITY): Payer: 59

## 2023-01-29 ENCOUNTER — Ambulatory Visit: Payer: 59 | Attending: Internal Medicine

## 2023-01-29 DIAGNOSIS — R079 Chest pain, unspecified: Secondary | ICD-10-CM | POA: Diagnosis not present

## 2023-01-29 LAB — ECHOCARDIOGRAM COMPLETE
AR max vel: 2.59 cm2
AV Area VTI: 2.69 cm2
AV Area mean vel: 2.64 cm2
AV Mean grad: 3 mm[Hg]
AV Peak grad: 6.5 mm[Hg]
Ao pk vel: 1.27 m/s
Area-P 1/2: 3 cm2
Calc EF: 58.3 %
MV VTI: 3.39 cm2
S' Lateral: 2.1 cm
Single Plane A2C EF: 43.3 %
Single Plane A4C EF: 68.2 %

## 2023-01-30 ENCOUNTER — Telehealth (HOSPITAL_COMMUNITY): Payer: Self-pay | Admitting: *Deleted

## 2023-01-30 NOTE — Telephone Encounter (Signed)
 Reaching out to patient to offer assistance regarding upcoming cardiac imaging study; pt verbalizes understanding of appt date/time, parking situation and where to check in, pre-test NPO status and medications ordered, and verified current allergies; name and call back number provided for further questions should they arise Sid Seats RN Navigator Cardiac Imaging [facility] [phone] office [phone] cell

## 2023-01-31 ENCOUNTER — Other Ambulatory Visit: Payer: Self-pay | Admitting: Neurology

## 2023-01-31 ENCOUNTER — Ambulatory Visit (HOSPITAL_COMMUNITY): Payer: 59

## 2023-01-31 DIAGNOSIS — I10 Essential (primary) hypertension: Secondary | ICD-10-CM | POA: Diagnosis not present

## 2023-01-31 DIAGNOSIS — M5412 Radiculopathy, cervical region: Secondary | ICD-10-CM | POA: Diagnosis not present

## 2023-01-31 DIAGNOSIS — G894 Chronic pain syndrome: Secondary | ICD-10-CM | POA: Diagnosis not present

## 2023-01-31 DIAGNOSIS — G629 Polyneuropathy, unspecified: Secondary | ICD-10-CM | POA: Diagnosis not present

## 2023-02-13 ENCOUNTER — Telehealth (HOSPITAL_COMMUNITY): Payer: Self-pay | Admitting: *Deleted

## 2023-02-13 NOTE — Telephone Encounter (Signed)
Attempted to call patient regarding upcoming cardiac CT appointment. °Left message on voicemail with name and callback number ° °Edie Vallandingham RN Navigator Cardiac Imaging °[facility] °[phone] Office °[phone] Cell ° °

## 2023-02-14 ENCOUNTER — Ambulatory Visit (HOSPITAL_COMMUNITY)
Admission: RE | Admit: 2023-02-14 | Discharge: 2023-02-14 | Disposition: A | Discharge status: Home / Self Care | Payer: 59 | Source: Ambulatory Visit | Attending: Internal Medicine | Admitting: Internal Medicine

## 2023-02-14 DIAGNOSIS — R079 Chest pain, unspecified: Secondary | ICD-10-CM | POA: Insufficient documentation

## 2023-02-14 MED ORDER — IOHEXOL 350 MG/ML SOLN
95.0000 mL | Freq: Once | INTRAVENOUS | Status: AC | PRN
  Administered 2023-02-14: 95 mL via INTRAVENOUS

## 2023-02-14 MED ORDER — DILTIAZEM HCL 25 MG/5ML IV SOLN: 10.0000 mg | INTRAVENOUS | Status: DC | PRN

## 2023-02-14 MED ORDER — NITROGLYCERIN 0.4 MG SL SUBL
SUBLINGUAL_TABLET | SUBLINGUAL | Status: AC
  Filled 2023-02-14: qty 2

## 2023-02-14 MED ORDER — METOPROLOL TARTRATE 5 MG/5ML IV SOLN
INTRAVENOUS | Status: AC
  Filled 2023-02-14: qty 5

## 2023-02-14 MED ORDER — NITROGLYCERIN 0.4 MG SL SUBL
0.8000 mg | SUBLINGUAL_TABLET | Freq: Once | SUBLINGUAL | Status: AC
  Administered 2023-02-14: 0.8 mg via SUBLINGUAL

## 2023-02-14 MED ORDER — METOPROLOL TARTRATE 5 MG/5ML IV SOLN
10.0000 mg | Freq: Once | INTRAVENOUS | Status: DC | PRN
Start: 2023-02-14 — End: 2023-02-15

## 2023-03-04 ENCOUNTER — Telehealth: Payer: 59 | Admitting: Adult Health

## 2023-03-06 DIAGNOSIS — M5412 Radiculopathy, cervical region: Secondary | ICD-10-CM | POA: Diagnosis not present

## 2023-03-06 DIAGNOSIS — G629 Polyneuropathy, unspecified: Secondary | ICD-10-CM | POA: Diagnosis not present

## 2023-03-06 DIAGNOSIS — Z0001 Encounter for general adult medical examination with abnormal findings: Secondary | ICD-10-CM | POA: Diagnosis not present

## 2023-03-06 DIAGNOSIS — G894 Chronic pain syndrome: Secondary | ICD-10-CM | POA: Diagnosis not present

## 2023-03-06 DIAGNOSIS — I10 Essential (primary) hypertension: Secondary | ICD-10-CM | POA: Diagnosis not present

## 2023-03-06 DIAGNOSIS — Z9229 Personal history of other drug therapy: Secondary | ICD-10-CM | POA: Diagnosis not present

## 2023-03-27 ENCOUNTER — Other Ambulatory Visit: Payer: Self-pay | Admitting: Neurology

## 2023-04-01 DIAGNOSIS — E2749 Other adrenocortical insufficiency: Secondary | ICD-10-CM | POA: Diagnosis not present

## 2023-04-09 ENCOUNTER — Other Ambulatory Visit (INDEPENDENT_AMBULATORY_CARE_PROVIDER_SITE_OTHER): Payer: Self-pay | Admitting: Gastroenterology

## 2023-04-09 ENCOUNTER — Other Ambulatory Visit: Payer: Self-pay | Admitting: Neurology

## 2023-04-09 DIAGNOSIS — B3781 Candidal esophagitis: Secondary | ICD-10-CM

## 2023-04-29 ENCOUNTER — Other Ambulatory Visit (HOSPITAL_COMMUNITY): Payer: Self-pay | Admitting: Internal Medicine

## 2023-04-29 DIAGNOSIS — R1310 Dysphagia, unspecified: Secondary | ICD-10-CM | POA: Diagnosis not present

## 2023-04-29 DIAGNOSIS — M5412 Radiculopathy, cervical region: Secondary | ICD-10-CM | POA: Diagnosis not present

## 2023-04-29 DIAGNOSIS — Z6820 Body mass index (BMI) 20.0-20.9, adult: Secondary | ICD-10-CM | POA: Diagnosis not present

## 2023-04-29 DIAGNOSIS — G629 Polyneuropathy, unspecified: Secondary | ICD-10-CM | POA: Diagnosis not present

## 2023-04-29 DIAGNOSIS — F419 Anxiety disorder, unspecified: Secondary | ICD-10-CM | POA: Diagnosis not present

## 2023-04-29 DIAGNOSIS — I10 Essential (primary) hypertension: Secondary | ICD-10-CM | POA: Diagnosis not present

## 2023-04-29 DIAGNOSIS — K219 Gastro-esophageal reflux disease without esophagitis: Secondary | ICD-10-CM | POA: Diagnosis not present

## 2023-04-29 DIAGNOSIS — E2749 Other adrenocortical insufficiency: Secondary | ICD-10-CM | POA: Diagnosis not present

## 2023-04-29 DIAGNOSIS — E039 Hypothyroidism, unspecified: Secondary | ICD-10-CM | POA: Diagnosis not present

## 2023-05-12 ENCOUNTER — Other Ambulatory Visit: Payer: Self-pay

## 2023-05-29 DIAGNOSIS — I10 Essential (primary) hypertension: Secondary | ICD-10-CM | POA: Diagnosis not present

## 2023-05-29 DIAGNOSIS — G43909 Migraine, unspecified, not intractable, without status migrainosus: Secondary | ICD-10-CM | POA: Diagnosis not present

## 2023-05-29 DIAGNOSIS — G629 Polyneuropathy, unspecified: Secondary | ICD-10-CM | POA: Diagnosis not present

## 2023-05-29 DIAGNOSIS — M5412 Radiculopathy, cervical region: Secondary | ICD-10-CM | POA: Diagnosis not present

## 2023-06-11 ENCOUNTER — Ambulatory Visit (HOSPITAL_COMMUNITY)
Admission: RE | Admit: 2023-06-11 | Discharge: 2023-06-11 | Disposition: A | Discharge status: Home / Self Care | Source: Ambulatory Visit | Attending: Internal Medicine | Admitting: Internal Medicine

## 2023-06-11 DIAGNOSIS — K224 Dyskinesia of esophagus: Secondary | ICD-10-CM | POA: Diagnosis not present

## 2023-06-11 DIAGNOSIS — R1310 Dysphagia, unspecified: Secondary | ICD-10-CM | POA: Insufficient documentation

## 2023-06-11 DIAGNOSIS — K449 Diaphragmatic hernia without obstruction or gangrene: Secondary | ICD-10-CM | POA: Diagnosis not present

## 2023-06-18 ENCOUNTER — Encounter (INDEPENDENT_AMBULATORY_CARE_PROVIDER_SITE_OTHER): Payer: Self-pay | Admitting: *Deleted

## 2023-06-24 ENCOUNTER — Encounter: Payer: Self-pay | Admitting: Internal Medicine

## 2023-06-24 ENCOUNTER — Ambulatory Visit: Payer: 59 | Attending: Internal Medicine | Admitting: Internal Medicine

## 2023-06-24 VITALS — BP 138/80 | HR 95 | Ht 64.0 in | Wt 119.0 lb

## 2023-06-24 DIAGNOSIS — I10 Essential (primary) hypertension: Secondary | ICD-10-CM | POA: Diagnosis not present

## 2023-06-24 MED ORDER — CHLORTHALIDONE 25 MG PO TABS: 25.0000 mg | ORAL_TABLET | Freq: Every day | ORAL | 3 refills | Status: AC

## 2023-06-24 NOTE — Progress Notes (Signed)
 Cardiology Office Note  Date: 06/24/2023   ID: Felicia Greer, DOB 1960/06/12, MRN [id number]  PCP:  Kathyleen Parkins, MD  Cardiologist:  Lasalle Pointer, MD Electrophysiologist:  None   History of Present Illness: Felicia Greer is a 63 y.o. female known to have GERD, COPD, HTN is here for follow-up visit.  CT cardiac from 2013 showed coronary calcium  score 0 and findings consistent with reflux esophagitis.  Echocardiogram from 2020 showed normal LVEF, indeterminate diastology, normal RV function, normal atrial size.  Patient was referred back to cardiology clinic for similar symptoms of chest pain.  Repeat CT cardiac showed coronary calcium  score 0 and no evidence of CAD.  Repeat echocardiogram in 2025 showed normal LVEF, G1 DD with normal LVEDP and no valvular heart disease.  She is here for follow-up visit.  She continues to have chest pains sometimes at rest, exertion and at night.  Her PCP started her on PPI which resolved her chest pain some but not completely.  She has pain in the right side of her chest.  She also checked her blood pressure occasionally at home and it was noted to be elevated, 180 mmHg SBP and 200 mmHg SBP.  She does not check blood pressures at home regularly.  No DOE, dizziness, palpitations, syncope or leg swelling.   Past Medical History:  Diagnosis Date   Acid reflux    Anxiety    Arthritis    pelvic bone, neck   Back pain    BV (bacterial vaginosis) 05/09/2010   COPD (chronic obstructive pulmonary disease) (HCC) 2010   Dyspareunia, female 01/05/2015   Hx of goiter    Hx: UTI (urinary tract infection)    Hypertension    Osteopenia determined by x-ray 03/10/2014   Pelvic pain in female 03/01/2014   Rhinitis 02/15/2014   Urinary frequency 01/05/2015   Vaginal odor 01/05/2015    Past Surgical History:  Procedure Laterality Date   BIOPSY  10/23/2022   Procedure: BIOPSY;  Surgeon: Hargis Lias, MD;  [facility];  Service: Endoscopy;;    BIOPSY THYROID  N/A 06/2009   had fna   BREAST BIOPSY Right in the 1990s   BREAST BIOPSY  1980   benign   CERVICAL FUSION N/A in the 1990s   COLONOSCOPY WITH PROPOFOL  N/A 10/23/2022   Procedure: COLONOSCOPY WITH PROPOFOL ;  Surgeon: Hargis Lias, MD;  [facility];  Service: Endoscopy;  Laterality: N/A;  10:45am;asa 2   ESOPHAGEAL BRUSHING  10/23/2022   Procedure: ESOPHAGEAL BRUSHING;  Surgeon: Hargis Lias, MD;  [facility];  Service: Endoscopy;;   ESOPHAGOGASTRODUODENOSCOPY (EGD) WITH PROPOFOL  N/A 10/23/2022   Procedure: ESOPHAGOGASTRODUODENOSCOPY (EGD) WITH PROPOFOL ;  Surgeon: Hargis Lias, MD;  [facility];  Service: Endoscopy;  Laterality: N/A;  10:45am;asa 2   HYSTEROSCOPY W/ ENDOMETRIAL ABLATION N/A 2004   POLYPECTOMY  10/23/2022   Procedure: POLYPECTOMY;  Surgeon: Hargis Lias, MD;  [facility];  Service: Endoscopy;;   TUBAL LIGATION      Current Outpatient Medications  Medication Sig Dispense Refill   AIMOVIG  140 MG/ML SOAJ ADMINISTER 1 ML UNDER THE SKIN EVERY 3 MONTHS AS DIRECTED 1 mL 2   ALPRAZolam  (XANAX ) 1 MG tablet Take 1 tablet by mouth at bedtime. *May take 1 tablet three times daily as needed for anxiety  0   amLODipine  (NORVASC ) 5 MG tablet Take 1 tablet (5 mg total) by mouth 2 (two) times daily. 180 tablet  3   aspirin  81 MG chewable tablet Chew by mouth daily.     dicyclomine (BENTYL) 10 MG capsule TAKE 1 CAPSULE BY MOUTH FOUR TIMES DAILY AS NEEDED FOR ABDOMINAL CRAMPS AND PAIN Oral for 30 Days     HYDROcodone -acetaminophen  (NORCO) 10-325 MG tablet Take 1 tablet by mouth every 6 (six) hours as needed.     Melatonin 10 MG TABS Take 10 mg by mouth daily.     metoprolol  tartrate (LOPRESSOR ) 100 MG tablet Take 1 tablet (100 mg total) by mouth once for 1 dose. 2 hours prior to procedure 1 tablet 0   Multiple Vitamin (MULTIVITAMIN) tablet Take 1 tablet by mouth daily.     nortriptyline  (PAMELOR ) 50 MG capsule TAKE 1  CAPSULE BY MOUTH AT BEDTIME. 30 capsule 0   ondansetron  (ZOFRAN ) 4 MG tablet Take 1 tablet (4 mg total) by mouth every 6 (six) hours as needed for nausea or vomiting (migraine). 20 tablet 11   oxybutynin  (DITROPAN ) 5 MG tablet Take 5 mg by mouth at bedtime.     Ubrogepant  (UBRELVY ) 100 MG TABS Take one tablet onset migraine and another tablet in 2 hours as needed  (max 2/24hr) 16 tablet 5   venlafaxine  XR (EFFEXOR  XR) 37.5 MG 24 hr capsule Take 1 capsule (37.5 mg total) by mouth at bedtime. 90 capsule 3   No current facility-administered medications for this visit.   Allergies:  Codeine, Metrogel  [metronidazole ], and Protonix [pantoprazole sodium]   Social History: The patient  reports that she has been smoking cigarettes. She started smoking about 50 years ago. She has a 50.7 pack-year smoking history. She has been exposed to tobacco smoke. She has never used smokeless tobacco. She reports that she does not drink alcohol and does not use drugs.   Family History: The patient's family history includes Anxiety disorder in her brother and daughter; Breast cancer in her maternal aunt, maternal grandmother, and another family member; COPD in her father; Cancer in her maternal aunt and another family member; Colon cancer in her mother; Emphysema in her maternal grandfather; Fibromyalgia in her mother; Heart attack in her father, paternal grandmother, and sister; Migraines in her maternal aunt and another family member; Other in her brother, father, mother, and sister.   ROS:  Please see the history of present illness. Otherwise, complete review of systems is positive for none  All other systems are reviewed and negative.   Physical Exam: VS:  There were no vitals taken for this visit., BMI There is no height or weight on file to calculate BMI.  Wt Readings from Last 3 Encounters:  12/23/22 116 lb 9.6 oz (52.9 kg)  10/09/22 118 lb 8 oz (53.8 kg)  10/08/22 119 lb 8 oz (54.2 kg)    General: Patient  appears comfortable at rest. HEENT: Conjunctiva and lids normal, oropharynx clear with moist mucosa. Neck: Supple, no elevated JVP or carotid bruits, no thyromegaly. Lungs: Clear to auscultation, nonlabored breathing at rest. Cardiac: Regular rate and rhythm, no S3 or significant systolic murmur, no pericardial rub. Abdomen: Soft, nontender, no hepatomegaly, bowel sounds present, no guarding or rebound. Extremities: No pitting edema, distal pulses 2+. Skin: Warm and dry. Musculoskeletal: No kyphosis. Neuropsychiatric: Alert and oriented x3, affect grossly appropriate.  Recent Labwork: 12/30/2022: ALT 19; AST 21; BUN 11; Creatinine, Ser 0.92; Hemoglobin 13.1; Platelets 349; Potassium 3.7; Sodium 139     Component Value Date/Time   CHOL 216 (H) 12/02/2018 0258   TRIG 63 12/02/2018 0258  HDL 104 12/02/2018 0258   CHOLHDL 2.1 12/02/2018 0258   VLDL 13 12/02/2018 0258   LDLCALC 99 12/02/2018 0258    Assessment and Plan:  Chest pain likely secondary to poorly controlled HTN, rule out CAD: CT cardiac was obtained in 2013 and 2025, coronary calcium  score was 0 and no evidence of CAD was noted.  Unlikely she has coronary microvascular dysfunction due to chest pains occurring at rest, exertion and at night.  Her chest pains are likely secondary to poorly controlled HTN due to random BP checks at home in the range of 180 to 200 mmHg SBP.  HTN, poorly controlled: Does not check BP at home regularly.  But whenever she checks, Home BPs range around 180 to 200 mmHg SBP.  Continue amlodipine  5 mg twice daily and start chlorthalidone 25 mg once daily.  She can follow-up with PCP for management of HTN.       Medication Adjustments/Labs and Tests Ordered: Current medicines are reviewed at length with the patient today.  Concerns regarding medicines are outlined above.    Disposition:  Follow up as needed  Signed Amoni Scallan Beauford Bounds, MD, 06/24/2023 2:05 PM    [facility]  HeartCare at [facility] [address] [place], [place], [place] [address]

## 2023-06-24 NOTE — Patient Instructions (Addendum)
 Medication Instructions:  Your physician has recommended you make the following change in your medication:  Start taking Chlorthalidone 25 mg once daily Continue taking all other medications as prescribed  Labwork: None  Testing/Procedures: None  Follow-Up: Your physician recommends that you schedule a follow-up appointment in: Follow up as needed  Any Other Special Instructions Will Be Listed Below (If Applicable). Thank you for choosing [facility]!     If you need a refill on your cardiac medications before your next appointment, please call your pharmacy.

## 2023-06-26 DIAGNOSIS — G8929 Other chronic pain: Secondary | ICD-10-CM | POA: Diagnosis not present

## 2023-06-26 DIAGNOSIS — K219 Gastro-esophageal reflux disease without esophagitis: Secondary | ICD-10-CM | POA: Diagnosis not present

## 2023-06-26 DIAGNOSIS — R1310 Dysphagia, unspecified: Secondary | ICD-10-CM | POA: Diagnosis not present

## 2023-06-26 DIAGNOSIS — F419 Anxiety disorder, unspecified: Secondary | ICD-10-CM | POA: Diagnosis not present

## 2023-07-08 ENCOUNTER — Telehealth: Payer: Self-pay | Admitting: *Deleted

## 2023-07-08 ENCOUNTER — Other Ambulatory Visit: Payer: Self-pay | Admitting: *Deleted

## 2023-07-08 ENCOUNTER — Encounter: Payer: Self-pay | Admitting: *Deleted

## 2023-07-08 ENCOUNTER — Ambulatory Visit (INDEPENDENT_AMBULATORY_CARE_PROVIDER_SITE_OTHER): Admitting: Gastroenterology

## 2023-07-08 ENCOUNTER — Encounter (INDEPENDENT_AMBULATORY_CARE_PROVIDER_SITE_OTHER): Payer: Self-pay | Admitting: Gastroenterology

## 2023-07-08 VITALS — BP 90/60 | HR 86 | Temp 97.8°F | Ht 64.0 in | Wt 118.3 lb

## 2023-07-08 DIAGNOSIS — R1319 Other dysphagia: Secondary | ICD-10-CM

## 2023-07-08 DIAGNOSIS — K219 Gastro-esophageal reflux disease without esophagitis: Secondary | ICD-10-CM | POA: Diagnosis not present

## 2023-07-08 DIAGNOSIS — Z860100 Personal history of colon polyps, unspecified: Secondary | ICD-10-CM | POA: Diagnosis not present

## 2023-07-08 DIAGNOSIS — R09A2 Foreign body sensation, throat: Secondary | ICD-10-CM | POA: Diagnosis not present

## 2023-07-08 DIAGNOSIS — R1310 Dysphagia, unspecified: Secondary | ICD-10-CM

## 2023-07-08 DIAGNOSIS — Z800 Family history of malignant neoplasm of digestive organs: Secondary | ICD-10-CM

## 2023-07-08 DIAGNOSIS — R490 Dysphonia: Secondary | ICD-10-CM

## 2023-07-08 DIAGNOSIS — R933 Abnormal findings on diagnostic imaging of other parts of digestive tract: Secondary | ICD-10-CM | POA: Diagnosis not present

## 2023-07-08 DIAGNOSIS — Z8619 Personal history of other infectious and parasitic diseases: Secondary | ICD-10-CM

## 2023-07-08 NOTE — Telephone Encounter (Signed)
 Per Availity  no precertification is required when this service is performed as an outpatient procedure for a medical or surgical diagnosis

## 2023-07-08 NOTE — Patient Instructions (Signed)
 It was very nice to meet you today, as dicussed with will plan for the following :  1) Upper endoscopy with dilation  2) Esophageal Manometry

## 2023-07-08 NOTE — H&P (View-Only) (Signed)
 Felicia Greer , M.D. Gastroenterology & Hepatology [facility] [address] [place], [place] [address] Primary Care Physician: Kathyleen Parkins, MD [address] [place] [address]  Chief Complaint: Dysphagia, globus sensation  History of Present Illness: Felicia Greer is a 63 y.o. female with lumbar spondylosis, cervical radiculopathy, hypertension, chronic pain with family history of colon cancer who presents for persistent dysphagia  Patient was last seen 09/2022 for with myself for positive Cologuard and refractory GERD where she underwent upper endoscopy and colonoscopy.  Found to have Candida esophagitis and colon polyps which were hyperplastic  Patient reports that she continues to have intermittent solid food dysphagia 2-3 times a week where food would slow down the upper chest and feels as if getting stuck in patient with panic at that time.  Patient would wake up in the middle of the night with heartburn recently with Protonix and has improved slightly  Last EGD:10/2022  - Esophageal plaques were found, suspicious for candidiasis. Brushings performed. - Erosive gastropathy with no bleeding and no stigmata of recent bleeding. Biopsied. - Normal duodenal bulb and second portion of the duodenum.   Last Colonoscopy:10/2022  - Four 3 to 13 mm polyps at the recto- sigmoid colon and in the sigmoid colon, removed with a cold snare. Resected and retrieved. - Non- bleeding external and internal hemorrhoids.  A. STOMACH, BIOPSY:  - Gastric antral mucosa with mild nonspecific reactive gastropathy  - Gastric oxyntic mucosa with no specific histopathologic changes  - Helicobacter pylori-like organisms are not identified on routine HE  stain   B. COLON, SIGMOID, RECTOSIGMOID, POLYPECTOMY:  - Hyperplastic polyp(s)    FHx: Mother colon cancer - after age 22 . Father multiple polyps  Surgical: no abdominal surgeries  Past  Medical History: Past Medical History:  Diagnosis Date   Acid reflux    Anxiety    Arthritis    pelvic bone, neck   Back pain    BV (bacterial vaginosis) 05/09/2010   COPD (chronic obstructive pulmonary disease) (HCC) 2010   Dyspareunia, female 01/05/2015   Hx of goiter    Hx: UTI (urinary tract infection)    Hypertension    Osteopenia determined by x-ray 03/10/2014   Pelvic pain in female 03/01/2014   Rhinitis 02/15/2014   Urinary frequency 01/05/2015   Vaginal odor 01/05/2015    Past Surgical History: Past Surgical History:  Procedure Laterality Date   BIOPSY  10/23/2022   Procedure: BIOPSY;  Surgeon: Hargis Lias, MD;  [facility];  Service: Endoscopy;;   BIOPSY THYROID  N/A 06/2009   had fna   BREAST BIOPSY Right in the 1990s   BREAST BIOPSY  1980   benign   CERVICAL FUSION N/A in the 1990s   COLONOSCOPY WITH PROPOFOL  N/A 10/23/2022   Procedure: COLONOSCOPY WITH PROPOFOL ;  Surgeon: Hargis Lias, MD;  [facility];  Service: Endoscopy;  Laterality: N/A;  10:45am;asa 2   ESOPHAGEAL BRUSHING  10/23/2022   Procedure: ESOPHAGEAL BRUSHING;  Surgeon: Hargis Lias, MD;  [facility];  Service: Endoscopy;;   ESOPHAGOGASTRODUODENOSCOPY (EGD) WITH PROPOFOL  N/A 10/23/2022   Procedure: ESOPHAGOGASTRODUODENOSCOPY (EGD) WITH PROPOFOL ;  Surgeon: Hargis Lias, MD;  [facility];  Service: Endoscopy;  Laterality: N/A;  10:45am;asa 2   HYSTEROSCOPY W/ ENDOMETRIAL ABLATION N/A 2004   POLYPECTOMY  10/23/2022   Procedure: POLYPECTOMY;  Surgeon: Hargis Lias, MD;  [facility];  Service: Endoscopy;;  TUBAL LIGATION      Family History: Family History  Problem Relation Age of Onset   Heart attack Paternal Grandmother    Breast cancer Maternal Grandmother    Emphysema Maternal Grandfather    Other Father        collapsed lung   COPD Father    Heart attack Father    Fibromyalgia Mother    Other Mother         sciatic nerve; bad shoulder   Colon cancer Mother    Anxiety disorder Brother    Other Brother        takes med for heart   Other Sister        valves replaced in heart   Heart attack Sister    Anxiety disorder Daughter    Cancer Maternal Aunt        cervical    Migraines Maternal Aunt    Breast cancer Maternal Aunt    Breast cancer Other    Migraines Other    Cancer Other        breast    Social History: Social History   Tobacco Use  Smoking Status Former   Current packs/day: 0.00   Average packs/day: 1 pack/day for 50.0 years (50.0 ttl pk-yrs)   Types: Cigarettes   Start date: 10/25/1972   Quit date: 10/2022   Years since quitting: 0.7   Passive exposure: Current  Smokeless Tobacco Never  Tobacco Comments   use electronic cigs - only does very occasionally    Social History   Substance and Sexual Activity  Alcohol Use No   Alcohol/week: 0.0 standard drinks of alcohol   Social History   Substance and Sexual Activity  Drug Use No    Allergies: Allergies  Allergen Reactions   Codeine Nausea Only   Metrogel  [Metronidazole ]     Had local reaction   Protonix [Pantoprazole Sodium] Rash    Medications: Current Outpatient Medications  Medication Sig Dispense Refill   acetaminophen  (TYLENOL ) 500 MG tablet Take 500 mg by mouth every 6 (six) hours as needed.     AIMOVIG  140 MG/ML SOAJ ADMINISTER 1 ML UNDER THE SKIN EVERY 3 MONTHS AS DIRECTED 1 mL 2   ALPRAZolam  (XANAX ) 1 MG tablet Take 1 tablet by mouth at bedtime. *May take 1 tablet three times daily as needed for anxiety  0   amLODipine  (NORVASC ) 5 MG tablet Take 1 tablet (5 mg total) by mouth 2 (two) times daily. 180 tablet 3   aspirin  81 MG chewable tablet Chew by mouth daily.     chlorthalidone  (HYGROTON ) 25 MG tablet Take 1 tablet (25 mg total) by mouth daily. 90 tablet 3   dicyclomine (BENTYL) 10 MG capsule TAKE 1 CAPSULE BY MOUTH FOUR TIMES DAILY AS NEEDED FOR ABDOMINAL CRAMPS AND PAIN Oral for 30 Days      DULoxetine (CYMBALTA) 30 MG capsule Take 60 mg by mouth daily.     HYDROcodone -acetaminophen  (NORCO) 10-325 MG tablet Take 1 tablet by mouth every 6 (six) hours as needed.     levothyroxine (SYNTHROID) 25 MCG tablet Take 25 mcg by mouth daily.     Melatonin 10 MG TABS Take 10 mg by mouth daily.     metoprolol  tartrate (LOPRESSOR ) 100 MG tablet Take 1 tablet (100 mg total) by mouth once for 1 dose. 2 hours prior to procedure 1 tablet 0   Multiple Vitamin (MULTIVITAMIN) tablet Take 1 tablet by mouth daily.     nortriptyline  (PAMELOR ) 50  MG capsule TAKE 1 CAPSULE BY MOUTH AT BEDTIME. 30 capsule 0   ondansetron  (ZOFRAN ) 4 MG tablet Take 1 tablet (4 mg total) by mouth every 6 (six) hours as needed for nausea or vomiting (migraine). 20 tablet 11   oxybutynin  (DITROPAN ) 5 MG tablet Take 5 mg by mouth at bedtime.     pantoprazole (PROTONIX) 40 MG tablet Take 40 mg by mouth daily.     Ubrogepant  (UBRELVY ) 100 MG TABS Take one tablet onset migraine and another tablet in 2 hours as needed  (max 2/24hr) 16 tablet 5   No current facility-administered medications for this visit.    Review of Systems: GENERAL: negative for malaise, night sweats HEENT: No changes in hearing or vision, no nose bleeds or other nasal problems. NECK: Negative for lumps, goiter, pain and significant neck swelling RESPIRATORY: Negative for cough, wheezing CARDIOVASCULAR: Negative for chest pain, leg swelling, palpitations, orthopnea GI: SEE HPI MUSCULOSKELETAL: Negative for joint pain or swelling, back pain, and muscle pain. SKIN: Negative for lesions, rash HEMATOLOGY Negative for prolonged bleeding, bruising easily, and swollen nodes. ENDOCRINE: Negative for cold or heat intolerance, polyuria, polydipsia and goiter. NEURO: negative for tremor, gait imbalance, syncope and seizures. The remainder of the review of systems is noncontributory.   Physical Exam: BP 90/60   Pulse 86   Temp 97.8 F (36.6 C)   Ht 5' 4 (1.626  m)   Wt 118 lb 4.8 oz (53.7 kg)   BMI 20.31 kg/m  GENERAL: The patient is AO x3, in no acute distress. HEENT: Head is normocephalic and atraumatic. EOMI are intact. Mouth is well hydrated and without lesions. NECK: Supple. No masses LUNGS: Clear to auscultation. No presence of rhonchi/wheezing/rales. Adequate chest expansion HEART: RRR, normal s1 and s2. ABDOMEN: Soft, nontender, no guarding, no peritoneal signs, and nondistended. BS +. No masses. EXTREMITIES: Without any cyanosis, clubbing, rash, lesions or edema. NEUROLOGIC: AOx3, no focal motor deficit. SKIN: no jaundice, no rashes   Imaging/Labs: as above     Latest Ref Rng & Units 12/30/2022    9:59 PM 12/01/2018    2:16 PM 02/15/2014   12:03 PM  CBC  WBC 4.0 - 10.5 K/uL 8.0  9.1  6.7   Hemoglobin 12.0 - 15.0 g/dL 30.8  65.7  84.6   Hematocrit 36.0 - 46.0 % 39.8  40.3  41.9   Platelets 150 - 400 K/uL 349  312  292    No results found for: IRON, TIBC, FERRITIN  I personally reviewed and interpreted the available labs, imaging and endoscopic files.  Esophagram  IMPRESSION: Cervical osteophytes with mild compression of the posterior esophagus. Moderate dysmotility with tertiary contractions. Tiny hiatal hernia. 13 mm barium tablet became stuck at the GE junction and did not pass.  Impression and Plan:  Jerrianne Hartin is a 63 y.o. female with lumbar spondylosis, cervical radiculopathy, hypertension, chronic pain with family history of colon cancer who presents for persistent dysphagia  # Globus sensation, # solid food dysphagia # Abnormal esophagogram #Refractory GERD   Patient had upper endoscopy September 2024 found to have Candida esophagitis and pretreatment symptoms has not resolved.  PPI has only slightly improved her nighttime awakening of heartburn and regurgitation  Esophagram with GE junction thickening and moderate dysmotility  This could be motility disorder such as achalasia which needs to be  investigated with high-resolution manometry.  Given symptoms not improved completely with PPI also 24 hours pH impedance off PPI is indicated  Will plan on upper  endoscopy with dilation 18 to 20 mm.  Referral for high-resolution esophageal manometry to evaluate for dysmotility disorder and 24-hour pH impedance off PPI  #Family history of colon cancer  Patient does  have any high risk factor for colorectal cancer malignancy given family history of Colon cancer in mother .    Last colonoscopy done 2024, in 2029  All questions were answered.      Aicha Clingenpeel Faizan Ryett Hamman, MD Gastroenterology and Hepatology [facility]   This chart has been completed using [facility] Dictation software, and while attempts have been made to ensure accuracy , certain words and phrases may not be transcribed as intended

## 2023-07-08 NOTE — Progress Notes (Signed)
 Felicia Greer , M.D. Gastroenterology & Hepatology [facility] [address] [place], [place] [address] Primary Care Physician: Kathyleen Parkins, MD [address] [place] [address]  Chief Complaint: Dysphagia, globus sensation  History of Present Illness: Felicia Greer is a 63 y.o. female with lumbar spondylosis, cervical radiculopathy, hypertension, chronic pain with family history of colon cancer who presents for persistent dysphagia  Patient was last seen 09/2022 for with myself for positive Cologuard and refractory GERD where she underwent upper endoscopy and colonoscopy.  Found to have Candida esophagitis and colon polyps which were hyperplastic  Patient reports that she continues to have intermittent solid food dysphagia 2-3 times a week where food would slow down the upper chest and feels as if getting stuck in patient with panic at that time.  Patient would wake up in the middle of the night with heartburn recently with Protonix and has improved slightly  Last EGD:10/2022  - Esophageal plaques were found, suspicious for candidiasis. Brushings performed. - Erosive gastropathy with no bleeding and no stigmata of recent bleeding. Biopsied. - Normal duodenal bulb and second portion of the duodenum.   Last Colonoscopy:10/2022  - Four 3 to 13 mm polyps at the recto- sigmoid colon and in the sigmoid colon, removed with a cold snare. Resected and retrieved. - Non- bleeding external and internal hemorrhoids.  A. STOMACH, BIOPSY:  - Gastric antral mucosa with mild nonspecific reactive gastropathy  - Gastric oxyntic mucosa with no specific histopathologic changes  - Helicobacter pylori-like organisms are not identified on routine HE  stain   B. COLON, SIGMOID, RECTOSIGMOID, POLYPECTOMY:  - Hyperplastic polyp(s)    FHx: Mother colon cancer - after age 22 . Father multiple polyps  Surgical: no abdominal surgeries  Past  Medical History: Past Medical History:  Diagnosis Date   Acid reflux    Anxiety    Arthritis    pelvic bone, neck   Back pain    BV (bacterial vaginosis) 05/09/2010   COPD (chronic obstructive pulmonary disease) (HCC) 2010   Dyspareunia, female 01/05/2015   Hx of goiter    Hx: UTI (urinary tract infection)    Hypertension    Osteopenia determined by x-ray 03/10/2014   Pelvic pain in female 03/01/2014   Rhinitis 02/15/2014   Urinary frequency 01/05/2015   Vaginal odor 01/05/2015    Past Surgical History: Past Surgical History:  Procedure Laterality Date   BIOPSY  10/23/2022   Procedure: BIOPSY;  Surgeon: Hargis Lias, MD;  [facility];  Service: Endoscopy;;   BIOPSY THYROID  N/A 06/2009   had fna   BREAST BIOPSY Right in the 1990s   BREAST BIOPSY  1980   benign   CERVICAL FUSION N/A in the 1990s   COLONOSCOPY WITH PROPOFOL  N/A 10/23/2022   Procedure: COLONOSCOPY WITH PROPOFOL ;  Surgeon: Hargis Lias, MD;  [facility];  Service: Endoscopy;  Laterality: N/A;  10:45am;asa 2   ESOPHAGEAL BRUSHING  10/23/2022   Procedure: ESOPHAGEAL BRUSHING;  Surgeon: Hargis Lias, MD;  [facility];  Service: Endoscopy;;   ESOPHAGOGASTRODUODENOSCOPY (EGD) WITH PROPOFOL  N/A 10/23/2022   Procedure: ESOPHAGOGASTRODUODENOSCOPY (EGD) WITH PROPOFOL ;  Surgeon: Hargis Lias, MD;  [facility];  Service: Endoscopy;  Laterality: N/A;  10:45am;asa 2   HYSTEROSCOPY W/ ENDOMETRIAL ABLATION N/A 2004   POLYPECTOMY  10/23/2022   Procedure: POLYPECTOMY;  Surgeon: Hargis Lias, MD;  [facility];  Service: Endoscopy;;  TUBAL LIGATION      Family History: Family History  Problem Relation Age of Onset   Heart attack Paternal Grandmother    Breast cancer Maternal Grandmother    Emphysema Maternal Grandfather    Other Father        collapsed lung   COPD Father    Heart attack Father    Fibromyalgia Mother    Other Mother         sciatic nerve; bad shoulder   Colon cancer Mother    Anxiety disorder Brother    Other Brother        takes med for heart   Other Sister        valves replaced in heart   Heart attack Sister    Anxiety disorder Daughter    Cancer Maternal Aunt        cervical    Migraines Maternal Aunt    Breast cancer Maternal Aunt    Breast cancer Other    Migraines Other    Cancer Other        breast    Social History: Social History   Tobacco Use  Smoking Status Former   Current packs/day: 0.00   Average packs/day: 1 pack/day for 50.0 years (50.0 ttl pk-yrs)   Types: Cigarettes   Start date: 10/25/1972   Quit date: 10/2022   Years since quitting: 0.7   Passive exposure: Current  Smokeless Tobacco Never  Tobacco Comments   use electronic cigs - only does very occasionally    Social History   Substance and Sexual Activity  Alcohol Use No   Alcohol/week: 0.0 standard drinks of alcohol   Social History   Substance and Sexual Activity  Drug Use No    Allergies: Allergies  Allergen Reactions   Codeine Nausea Only   Metrogel  [Metronidazole ]     Had local reaction   Protonix [Pantoprazole Sodium] Rash    Medications: Current Outpatient Medications  Medication Sig Dispense Refill   acetaminophen  (TYLENOL ) 500 MG tablet Take 500 mg by mouth every 6 (six) hours as needed.     AIMOVIG  140 MG/ML SOAJ ADMINISTER 1 ML UNDER THE SKIN EVERY 3 MONTHS AS DIRECTED 1 mL 2   ALPRAZolam  (XANAX ) 1 MG tablet Take 1 tablet by mouth at bedtime. *May take 1 tablet three times daily as needed for anxiety  0   amLODipine  (NORVASC ) 5 MG tablet Take 1 tablet (5 mg total) by mouth 2 (two) times daily. 180 tablet 3   aspirin  81 MG chewable tablet Chew by mouth daily.     chlorthalidone  (HYGROTON ) 25 MG tablet Take 1 tablet (25 mg total) by mouth daily. 90 tablet 3   dicyclomine (BENTYL) 10 MG capsule TAKE 1 CAPSULE BY MOUTH FOUR TIMES DAILY AS NEEDED FOR ABDOMINAL CRAMPS AND PAIN Oral for 30 Days      DULoxetine (CYMBALTA) 30 MG capsule Take 60 mg by mouth daily.     HYDROcodone -acetaminophen  (NORCO) 10-325 MG tablet Take 1 tablet by mouth every 6 (six) hours as needed.     levothyroxine (SYNTHROID) 25 MCG tablet Take 25 mcg by mouth daily.     Melatonin 10 MG TABS Take 10 mg by mouth daily.     metoprolol  tartrate (LOPRESSOR ) 100 MG tablet Take 1 tablet (100 mg total) by mouth once for 1 dose. 2 hours prior to procedure 1 tablet 0   Multiple Vitamin (MULTIVITAMIN) tablet Take 1 tablet by mouth daily.     nortriptyline  (PAMELOR ) 50  MG capsule TAKE 1 CAPSULE BY MOUTH AT BEDTIME. 30 capsule 0   ondansetron  (ZOFRAN ) 4 MG tablet Take 1 tablet (4 mg total) by mouth every 6 (six) hours as needed for nausea or vomiting (migraine). 20 tablet 11   oxybutynin  (DITROPAN ) 5 MG tablet Take 5 mg by mouth at bedtime.     pantoprazole (PROTONIX) 40 MG tablet Take 40 mg by mouth daily.     Ubrogepant  (UBRELVY ) 100 MG TABS Take one tablet onset migraine and another tablet in 2 hours as needed  (max 2/24hr) 16 tablet 5   No current facility-administered medications for this visit.    Review of Systems: GENERAL: negative for malaise, night sweats HEENT: No changes in hearing or vision, no nose bleeds or other nasal problems. NECK: Negative for lumps, goiter, pain and significant neck swelling RESPIRATORY: Negative for cough, wheezing CARDIOVASCULAR: Negative for chest pain, leg swelling, palpitations, orthopnea GI: SEE HPI MUSCULOSKELETAL: Negative for joint pain or swelling, back pain, and muscle pain. SKIN: Negative for lesions, rash HEMATOLOGY Negative for prolonged bleeding, bruising easily, and swollen nodes. ENDOCRINE: Negative for cold or heat intolerance, polyuria, polydipsia and goiter. NEURO: negative for tremor, gait imbalance, syncope and seizures. The remainder of the review of systems is noncontributory.   Physical Exam: BP 90/60   Pulse 86   Temp 97.8 F (36.6 C)   Ht 5' 4 (1.626  m)   Wt 118 lb 4.8 oz (53.7 kg)   BMI 20.31 kg/m  GENERAL: The patient is AO x3, in no acute distress. HEENT: Head is normocephalic and atraumatic. EOMI are intact. Mouth is well hydrated and without lesions. NECK: Supple. No masses LUNGS: Clear to auscultation. No presence of rhonchi/wheezing/rales. Adequate chest expansion HEART: RRR, normal s1 and s2. ABDOMEN: Soft, nontender, no guarding, no peritoneal signs, and nondistended. BS +. No masses. EXTREMITIES: Without any cyanosis, clubbing, rash, lesions or edema. NEUROLOGIC: AOx3, no focal motor deficit. SKIN: no jaundice, no rashes   Imaging/Labs: as above     Latest Ref Rng & Units 12/30/2022    9:59 PM 12/01/2018    2:16 PM 02/15/2014   12:03 PM  CBC  WBC 4.0 - 10.5 K/uL 8.0  9.1  6.7   Hemoglobin 12.0 - 15.0 g/dL 30.8  65.7  84.6   Hematocrit 36.0 - 46.0 % 39.8  40.3  41.9   Platelets 150 - 400 K/uL 349  312  292    No results found for: IRON, TIBC, FERRITIN  I personally reviewed and interpreted the available labs, imaging and endoscopic files.  Esophagram  IMPRESSION: Cervical osteophytes with mild compression of the posterior esophagus. Moderate dysmotility with tertiary contractions. Tiny hiatal hernia. 13 mm barium tablet became stuck at the GE junction and did not pass.  Impression and Plan:  Felicia Greer is a 63 y.o. female with lumbar spondylosis, cervical radiculopathy, hypertension, chronic pain with family history of colon cancer who presents for persistent dysphagia  # Globus sensation, # solid food dysphagia # Abnormal esophagogram #Refractory GERD   Patient had upper endoscopy September 2024 found to have Candida esophagitis and pretreatment symptoms has not resolved.  PPI has only slightly improved her nighttime awakening of heartburn and regurgitation  Esophagram with GE junction thickening and moderate dysmotility  This could be motility disorder such as achalasia which needs to be  investigated with high-resolution manometry.  Given symptoms not improved completely with PPI also 24 hours pH impedance off PPI is indicated  Will plan on upper  endoscopy with dilation 18 to 20 mm.  Referral for high-resolution esophageal manometry to evaluate for dysmotility disorder and 24-hour pH impedance off PPI  #Family history of colon cancer  Patient does  have any high risk factor for colorectal cancer malignancy given family history of Colon cancer in mother .    Last colonoscopy done 2024, in 2029  All questions were answered.      Felicia Clingenpeel Faizan Ryett Hamman, MD Gastroenterology and Hepatology [facility] Gastroenterology   This chart has been completed using [facility] Dictation software, and while attempts have been made to ensure accuracy , certain words and phrases may not be transcribed as intended

## 2023-07-13 ENCOUNTER — Other Ambulatory Visit: Payer: Self-pay | Admitting: Neurology

## 2023-07-21 ENCOUNTER — Telehealth (INDEPENDENT_AMBULATORY_CARE_PROVIDER_SITE_OTHER): Payer: Self-pay | Admitting: Gastroenterology

## 2023-07-21 NOTE — Telephone Encounter (Signed)
 Thanks for updating us  ,. She should keep that appointment and check with [facility] intermittently for any cancellations. I look forward to see her on the day of upper endoscopy

## 2023-07-21 NOTE — Telephone Encounter (Signed)
 Pt contacted and verbalized understanding.

## 2023-07-21 NOTE — Telephone Encounter (Signed)
 Pt left voicemail stating that she called to set up manometry at [facility] but they are backed up due to machine being broke. Pt did schedule appt for 12/25/23. Pt is wanting to know if this is OK with provider. Please advise. Thank you,

## 2023-07-22 ENCOUNTER — Other Ambulatory Visit: Payer: Self-pay

## 2023-07-22 ENCOUNTER — Encounter (HOSPITAL_COMMUNITY)
Admission: RE | Admit: 2023-07-22 | Discharge: 2023-07-22 | Disposition: A | Discharge status: Home / Self Care | Source: Ambulatory Visit | Attending: Gastroenterology | Admitting: Gastroenterology

## 2023-07-22 ENCOUNTER — Encounter (HOSPITAL_COMMUNITY): Payer: Self-pay

## 2023-07-22 DIAGNOSIS — R1319 Other dysphagia: Secondary | ICD-10-CM | POA: Diagnosis not present

## 2023-07-23 LAB — BASIC METABOLIC PANEL WITH GFR
BUN/Creatinine Ratio: 14 (ref 12–28)
BUN: 17 mg/dL (ref 8–27)
CO2: 27 mmol/L (ref 20–29)
Calcium: 10 mg/dL (ref 8.7–10.3)
Chloride: 89 mmol/L — ABNORMAL LOW (ref 96–106)
Creatinine, Ser: 1.18 mg/dL — ABNORMAL HIGH (ref 0.57–1.00)
Glucose: 87 mg/dL (ref 70–99)
Potassium: 4.1 mmol/L (ref 3.5–5.2)
Sodium: 132 mmol/L — ABNORMAL LOW (ref 134–144)
eGFR: 52 mL/min/{1.73_m2} — ABNORMAL LOW (ref >59)

## 2023-07-24 ENCOUNTER — Encounter (HOSPITAL_COMMUNITY): Payer: Self-pay | Admitting: Gastroenterology

## 2023-07-24 ENCOUNTER — Encounter (HOSPITAL_COMMUNITY)
Admission: RE | Disposition: A | Discharge status: Home / Self Care | Payer: Self-pay | Source: Home / Self Care | Attending: Gastroenterology

## 2023-07-24 ENCOUNTER — Ambulatory Visit (HOSPITAL_COMMUNITY): Admitting: Anesthesiology

## 2023-07-24 ENCOUNTER — Ambulatory Visit (HOSPITAL_COMMUNITY)
Admission: RE | Admit: 2023-07-24 | Discharge: 2023-07-24 | Disposition: A | Discharge status: Home / Self Care | Attending: Gastroenterology | Admitting: Gastroenterology

## 2023-07-24 ENCOUNTER — Other Ambulatory Visit: Payer: Self-pay

## 2023-07-24 DIAGNOSIS — Z87891 Personal history of nicotine dependence: Secondary | ICD-10-CM | POA: Diagnosis not present

## 2023-07-24 DIAGNOSIS — K222 Esophageal obstruction: Secondary | ICD-10-CM

## 2023-07-24 DIAGNOSIS — I10 Essential (primary) hypertension: Secondary | ICD-10-CM | POA: Diagnosis not present

## 2023-07-24 DIAGNOSIS — R1310 Dysphagia, unspecified: Secondary | ICD-10-CM | POA: Diagnosis not present

## 2023-07-24 DIAGNOSIS — K2289 Other specified disease of esophagus: Secondary | ICD-10-CM | POA: Diagnosis not present

## 2023-07-24 DIAGNOSIS — K2100 Gastro-esophageal reflux disease with esophagitis, without bleeding: Secondary | ICD-10-CM | POA: Diagnosis not present

## 2023-07-24 DIAGNOSIS — Z8249 Family history of ischemic heart disease and other diseases of the circulatory system: Secondary | ICD-10-CM | POA: Insufficient documentation

## 2023-07-24 DIAGNOSIS — K648 Other hemorrhoids: Secondary | ICD-10-CM | POA: Diagnosis not present

## 2023-07-24 DIAGNOSIS — G709 Myoneural disorder, unspecified: Secondary | ICD-10-CM | POA: Insufficient documentation

## 2023-07-24 DIAGNOSIS — M1990 Unspecified osteoarthritis, unspecified site: Secondary | ICD-10-CM | POA: Insufficient documentation

## 2023-07-24 DIAGNOSIS — Z800 Family history of malignant neoplasm of digestive organs: Secondary | ICD-10-CM | POA: Insufficient documentation

## 2023-07-24 DIAGNOSIS — J449 Chronic obstructive pulmonary disease, unspecified: Secondary | ICD-10-CM | POA: Diagnosis not present

## 2023-07-24 DIAGNOSIS — F418 Other specified anxiety disorders: Secondary | ICD-10-CM | POA: Insufficient documentation

## 2023-07-24 HISTORY — PX: ESOPHAGOGASTRODUODENOSCOPY: SHX5428

## 2023-07-24 HISTORY — PX: ESOPHAGEAL DILATION: SHX303

## 2023-07-24 LAB — KOH PREP: KOH Prep: NONE SEEN

## 2023-07-24 SURGERY — EGD (ESOPHAGOGASTRODUODENOSCOPY)
Anesthesia: General

## 2023-07-24 MED ORDER — PROPOFOL 500 MG/50ML IV EMUL
INTRAVENOUS | Status: AC
Start: 2023-07-24 — End: 2023-07-24
  Filled 2023-07-24: qty 50

## 2023-07-24 MED ORDER — LIDOCAINE 2% (20 MG/ML) 5 ML SYRINGE
INTRAMUSCULAR | Status: AC
  Filled 2023-07-24: qty 5

## 2023-07-24 MED ORDER — FLUCONAZOLE 200 MG PO TABS: ORAL_TABLET | ORAL | 0 refills | Status: AC

## 2023-07-24 MED ORDER — LACTATED RINGERS IV SOLN: INTRAVENOUS | Status: DC

## 2023-07-24 MED ORDER — PROPOFOL 10 MG/ML IV BOLUS
INTRAVENOUS | Status: DC | PRN
  Administered 2023-07-24 (×2): 50 mg via INTRAVENOUS

## 2023-07-24 MED ORDER — LIDOCAINE 2% (20 MG/ML) 5 ML SYRINGE
INTRAMUSCULAR | Status: DC | PRN
  Administered 2023-07-24: 100 mg via INTRAVENOUS

## 2023-07-24 MED ORDER — PROPOFOL 500 MG/50ML IV EMUL
INTRAVENOUS | Status: DC | PRN
  Administered 2023-07-24: 150 ug/kg/min via INTRAVENOUS

## 2023-07-24 NOTE — Anesthesia Preprocedure Evaluation (Addendum)
 Anesthesia Evaluation  Patient identified by MRN, date of birth, ID band Patient awake    Reviewed: Allergy & Precautions, H&P , NPO status , Patient's Chart, lab work & pertinent test results  Airway Mallampati: II  TM Distance: >3 FB Neck ROM: Full    Dental  (+) Missing   Pulmonary COPD, Patient abstained from smoking., former smoker Vapes rarely   Pulmonary exam normal breath sounds clear to auscultation       Cardiovascular hypertension, Normal cardiovascular exam Rhythm:Regular Rate:Normal     Neuro/Psych  Headaches PSYCHIATRIC DISORDERS Anxiety Depression     Neuromuscular disease    GI/Hepatic Neg liver ROS,GERD  ,,  Endo/Other  negative endocrine ROS    Renal/GU negative Renal ROS  negative genitourinary   Musculoskeletal  (+) Arthritis ,    Abdominal   Peds negative pediatric ROS (+)  Hematology negative hematology ROS (+)   Anesthesia Other Findings   Reproductive/Obstetrics negative OB ROS                              Anesthesia Physical Anesthesia Plan  ASA: 2  Anesthesia Plan: General   Post-op Pain Management:    Induction: Intravenous  PONV Risk Score and Plan: Propofol  infusion  Airway Management Planned: Nasal Cannula  Additional Equipment:   Intra-op Plan:   Post-operative Plan:   Informed Consent: I have reviewed the patients History and Physical, chart, labs and discussed the procedure including the risks, benefits and alternatives for the proposed anesthesia with the patient or authorized representative who has indicated his/her understanding and acceptance.     Dental advisory given  Plan Discussed with: CRNA  Anesthesia Plan Comments:          Anesthesia Quick Evaluation

## 2023-07-24 NOTE — Interval H&P Note (Signed)
 History and Physical Interval Note:  07/24/2023 9:06 AM  Felicia Greer  has presented today for surgery, with the diagnosis of DYSPHAGIA.  The various methods of treatment have been discussed with the patient and family. After consideration of risks, benefits and other options for treatment, the patient has consented to  Procedure(s) with comments: EGD (ESOPHAGOGASTRODUODENOSCOPY) (N/A) - 11:00AM, ASA 1-2, pt knows to arrive at 7:00 DILATION, ESOPHAGUS (N/A) as a surgical intervention.  The patient's history has been reviewed, patient examined, no change in status, stable for surgery.  I have reviewed the patient's chart and labs.  Questions were answered to the patient's satisfaction.     Deatrice FALCON Kiel Cockerell

## 2023-07-24 NOTE — Op Note (Addendum)
 [facility] Patient Name: Felicia Greer Procedure Date: 07/24/2023 7:23 AM MRN: [id number] Date of Birth: [birth year] Attending MD: Deatrice Dine , MD, [id number] CSN: [id number] Age: 63 Admit Type: Outpatient Procedure:                Upper GI endoscopy Indications:              Dysphagia Providers:                Deatrice Dine, MD, Rosina Sprague, Leandrew Edelman RN,                            RN, Gordy Lonni Balm, Technician Referring MD:             Deatrice Dine, MD Medicines:                Monitored Anesthesia Care Complications:            No immediate complications. Estimated Blood Loss:     Estimated blood loss: none. Procedure:                Pre-Anesthesia Assessment:                           - Prior to the procedure, a History and Physical                            was performed, and patient medications and                            allergies were reviewed. The patient's tolerance of                            previous anesthesia was also reviewed. The risks                            and benefits of the procedure and the sedation                            options and risks were discussed with the patient.                            All questions were answered, and informed consent                            was obtained. Prior Anticoagulants: The patient has                            taken no anticoagulant or antiplatelet agents. ASA                            Grade Assessment: II - A patient with mild systemic                            disease. After reviewing the risks and benefits,  the patient was deemed in satisfactory condition to                            undergo the procedure.                           After obtaining informed consent, the endoscope was                            passed under direct vision. Throughout the                            procedure, the patient's blood pressure, pulse, and                             oxygen saturations were monitored continuously. The                            [id number]-H190 ([id number]) scope was introduced through the                            mouth, and advanced to the second part of duodenum.                            The upper GI endoscopy was accomplished without                            difficulty. The patient tolerated the procedure                            well. Scope In: 9:17:15 AM Scope Out: 9:25:57 AM Total Procedure Duration: 0 hours 8 minutes 42 seconds  Findings:      White nummular lesions were noted in the entire esophagus. Brushings for       cytology and KOH prep were obtained in the lower third of the esophagus.      Diffuse moderate mucosal changes characterized by congestion, altered       texture and white specks were found in the entire esophagus. Biopsies       were obtained from the proximal and distal esophagus with cold forceps       for histology of suspected eosinophilic esophagitis.      One benign-appearing, intrinsic moderate (circumferential scarring or       stenosis; an endoscope may pass) stenosis was found. The stenosis was       traversed. A TTS dilator was passed through the scope. Dilation with a       15-16.5-18 mm balloon dilator was performed to 18 mm. The dilation site       was examined following endoscope reinsertion and showed no change.      Esophagogastric landmarks were identified: the Z-line was found at 40 cm       and the gastroesophageal junction was found at 40 cm from the incisors.      The entire examined stomach was normal.      The duodenal bulb and second portion of the duodenum were normal. Impression:               -  White nummular lesions in esophageal mucosa.                            Brushings performed. Second EGD with candida                           - Congested, texture changed, white specked mucosa                            in the esophagus.                           - Benign-appearing esophageal  stenosis, pluckering.                            Dilated without any change in the mucosa .                           - Esophagogastric landmarks identified.                           - Normal stomach.                           - Normal duodenal bulb and second portion of the                            duodenum.                           - Biopsies were taken with a cold forceps for                            evaluation of eosinophilic esophagitis. Moderate Sedation:      Per Anesthesia Care Recommendation:           - Patient has a contact number available for                            emergencies. The signs and symptoms of potential                            delayed complications were discussed with the                            patient. Return to normal activities tomorrow.                            Written discharge instructions were provided to the                            patient.                           - Resume previous diet.                           -  Continue present medications.                           - Await pathology results.                           -will give 28 day course of diflucan  and if patient                            continues to be symptomatic will switch to                            itraconazole                           -GE junction pluckering concerning for achalasia,                            obtain manometry Procedure Code(s):        --- Professional ---                           [phone], Esophagogastroduodenoscopy, flexible,                            transoral; with biopsy, single or multiple Diagnosis Code(s):        --- Professional ---                           K22.89, Other specified disease of esophagus                           R13.10, Dysphagia, unspecified CPT copyright 2022 American Medical Association. All rights reserved. The codes documented in this report are preliminary and upon coder review may  be revised to meet current  compliance requirements. Deatrice Dine, MD Deatrice Dine, MD 07/24/2023 9:38:17 AM This report has been signed electronically. Number of Addenda: 0

## 2023-07-24 NOTE — Transfer of Care (Signed)
 Immediate Anesthesia Transfer of Care Note  Patient: Cathryn Gallery  Procedure(s) Performed: EGD (ESOPHAGOGASTRODUODENOSCOPY) DILATION, ESOPHAGUS  Patient Location: Short Stay  Anesthesia Type:MAC  Level of Consciousness: drowsy and patient cooperative  Airway & Oxygen Therapy: Patient Spontanous Breathing  Post-op Assessment: Report given to RN and Post -op Vital signs reviewed and stable  Post vital signs: Reviewed and stable  Last Vitals:  Vitals Value Taken Time  BP 136/74 07/24/23 09:34  Temp    Pulse 72  07/24/23 09:34  Resp 14 07/24/23 09:34  SpO2 99% on RA 07/24/23 09:34    Last Pain:  Vitals:   07/24/23 0909  TempSrc:   PainSc: 0-No pain         Complications: No notable events documented.

## 2023-07-24 NOTE — Discharge Instructions (Signed)

## 2023-07-24 NOTE — Anesthesia Postprocedure Evaluation (Signed)
 Anesthesia Post Note  Patient: Felicia Greer  Procedure(s) Performed: EGD (ESOPHAGOGASTRODUODENOSCOPY) DILATION, ESOPHAGUS  Patient location during evaluation: PACU Anesthesia Type: General Level of consciousness: awake and alert Pain management: pain level controlled Vital Signs Assessment: post-procedure vital signs reviewed and stable Respiratory status: spontaneous breathing, nonlabored ventilation, respiratory function stable and patient connected to nasal cannula oxygen Cardiovascular status: stable and blood pressure returned to baseline Postop Assessment: no apparent nausea or vomiting Anesthetic complications: no   No notable events documented.   Last Vitals:  Vitals:   07/24/23 0717 07/24/23 0933  BP: (!) 141/95 136/72  Pulse: 86 83  Resp: 19   Temp: 36.6 C 36.5 C  SpO2: 100% 100%    Last Pain:  Vitals:   07/24/23 0933  TempSrc: Oral  PainSc: 0-No pain                 Andrea Limes

## 2023-07-28 ENCOUNTER — Ambulatory Visit (INDEPENDENT_AMBULATORY_CARE_PROVIDER_SITE_OTHER): Payer: Self-pay | Admitting: Gastroenterology

## 2023-07-28 ENCOUNTER — Encounter (HOSPITAL_COMMUNITY): Payer: Self-pay | Admitting: Gastroenterology

## 2023-07-28 LAB — SURGICAL PATHOLOGY

## 2023-07-28 NOTE — Progress Notes (Signed)
 I reviewed the pathology results. Ann, can you send her a letter with the findings as described below please?  Thanks,  Jaqwon Manfred Faizan Amenah Tucci, MD Gastroenterology and Hepatology [facility] Gastroenterology  ---------------------------------------------------------------------------------------------  [facility]astroenterology [address], [place], [place] [address] Phone:  [phone]     07/28/23 [place], [place]                                                                          Dear Felicia Greer,   I am writing to inform you that the biopsies taken during your recent endoscopic examination showed:   A. ESOPHAGEAL BIOPSY:  - Esophageal squamous mucosa with mild vascular congestion, and focal  squamous ballooning, suggestive of reflux esophagitis  - Negative for increased intraepithelial eosinophils   What does this mean?  Biopsies suggest reflux esophageal inflammation.  No evidence of fungal infection was suggested both on biopsy and brush.    You can stop fluconazole .    Continue taking Protonix 40 mg twice daily  Most importantly I recommend esophageal manometry testing to see if your food pipe is moving well..  Please continue to see us  in the GI clinic  Also I value your feedback , so if you get a survey , please take the time to fill it out and thank you for choosing [facility]  Please call us  at [phone] if you have persistent problems or have questions about your condition that have not been fully answered at this time.   Sincerely,   Tymier Lindholm Faizan Namiko Pritts, MD Gastroenterology and Hepatology

## 2023-07-29 NOTE — Progress Notes (Signed)
 Patient result letter mailed

## 2023-08-08 ENCOUNTER — Encounter: Payer: Self-pay | Admitting: Advanced Practice Midwife

## 2023-08-26 ENCOUNTER — Encounter (INDEPENDENT_AMBULATORY_CARE_PROVIDER_SITE_OTHER): Payer: Self-pay | Admitting: Gastroenterology

## 2023-08-26 ENCOUNTER — Encounter: Payer: Self-pay | Admitting: *Deleted

## 2023-08-26 ENCOUNTER — Ambulatory Visit (INDEPENDENT_AMBULATORY_CARE_PROVIDER_SITE_OTHER): Admitting: Gastroenterology

## 2023-08-26 ENCOUNTER — Telehealth: Payer: Self-pay | Admitting: *Deleted

## 2023-08-26 VITALS — BP 106/73 | HR 85 | Temp 97.6°F | Ht 64.0 in | Wt 121.7 lb

## 2023-08-26 DIAGNOSIS — R140 Abdominal distension (gaseous): Secondary | ICD-10-CM | POA: Diagnosis not present

## 2023-08-26 DIAGNOSIS — Z800 Family history of malignant neoplasm of digestive organs: Secondary | ICD-10-CM | POA: Diagnosis not present

## 2023-08-26 DIAGNOSIS — K219 Gastro-esophageal reflux disease without esophagitis: Secondary | ICD-10-CM

## 2023-08-26 DIAGNOSIS — Z87891 Personal history of nicotine dependence: Secondary | ICD-10-CM

## 2023-08-26 DIAGNOSIS — K5900 Constipation, unspecified: Secondary | ICD-10-CM

## 2023-08-26 DIAGNOSIS — R1310 Dysphagia, unspecified: Secondary | ICD-10-CM

## 2023-08-26 DIAGNOSIS — K581 Irritable bowel syndrome with constipation: Secondary | ICD-10-CM | POA: Insufficient documentation

## 2023-08-26 DIAGNOSIS — Z8379 Family history of other diseases of the digestive system: Secondary | ICD-10-CM | POA: Diagnosis not present

## 2023-08-26 DIAGNOSIS — K5904 Chronic idiopathic constipation: Secondary | ICD-10-CM

## 2023-08-26 DIAGNOSIS — R1319 Other dysphagia: Secondary | ICD-10-CM

## 2023-08-26 MED ORDER — LINACLOTIDE 72 MCG PO CAPS: 72.0000 ug | ORAL_CAPSULE | Freq: Every day | ORAL | 1 refills | Status: AC

## 2023-08-26 MED ORDER — POLYETHYLENE GLYCOL 3350 17 GM/SCOOP PO POWD: 8.5000 g | Freq: Every day | ORAL | Status: AC

## 2023-08-26 MED ORDER — PANTOPRAZOLE SODIUM 40 MG PO TBEC: 40.0000 mg | DELAYED_RELEASE_TABLET | Freq: Every day | ORAL | 1 refills | Status: DC

## 2023-08-26 NOTE — Patient Instructions (Signed)
 It was very nice to meet you today, as dicussed with will plan for the following :  1)Ensure adequate fluid intake: Aim for 8 glasses of water daily. Follow a high fiber diet: Include foods such as dates, prunes, pears, and kiwi. Take Miralax  twice a day for the first week, then reduce to once daily thereafter.  2)START LINZESS   3) Protonix  40mg , 30 min before breakfast  4) CT scan abdomen  5) Manometry at [facility]

## 2023-08-26 NOTE — Telephone Encounter (Signed)
 [facility] PA for CT: Authorization Number: [id number] Case Number: [id number] Review Date: 08/26/2023 11:35:23 AM Expiration Date: 02/22/2024 Status: Your case has been Approved. The prior authorization you submitted, Case [id number], has been received. Additional case status notifications will be sent if you opted in for email notifications. Thank you.

## 2023-08-26 NOTE — Progress Notes (Signed)
 Jb Dulworth Faizan Codylee Patil , M.D. Gastroenterology & Hepatology [facility] Gastroenterology [address] [place], [place] [address] Primary Care Physician: Bertell Satterfield, MD [address] [place] [address]  Chief Complaint:  persistent dysphagia, worsening abdominal bloating distention with flatulence and constipation  History of Present Illness:   Felicia Greer is a 63 y.o. female with lumbar spondylosis, cervical radiculopathy, hypertension, chronic pain with family history of colon cancer/pancreatitis who presents for persistent dysphagia, worsening abdominal bloating distention with flatulence and constipation  Patient was last seen 09/2022 for with myself for positive Cologuard and refractory GERD where she underwent upper endoscopy and colonoscopy.  Found to have Candida esophagitis and colon polyps which were hyperplastic.  Patient had repeat upper endoscopy 07/2023 with dilation to 18mm.  Esophagogram demonstrated moderate dysmotility and 13 mm barium tablet getting stuck at GE junction   Patient reports that she continues to have intermittent solid food dysphagia 2-3 times a week where food would slow down the upper chest and feels as if getting stuck in patient with panic at that time.  Patient would wake up in the middle of the night with heartburn recently with Protonix  and has improved slightly.  Patient reports that there is some improvement with last dilation  Patient reports having 1 bowel movement once a week not having significant abdominal distention and bloating with flatulence  Last EGD:07/2023  - White nummular lesions in esophageal mucosa. Brushings performed. Second EGD with candida - Congested, texture changed, white specked mucosa in the esophagus. - Benign- appearing esophageal stenosis, pluckering. Dilated without any change in the mucosa . - Esophagogastric landmarks identified. - Normal stomach. - Normal duodenal bulb and  second portion of the duodenum. - Biopsies were taken with a cold forceps for evaluation of eosinophilic esophagitis.   A. ESOPHAGEAL BIOPSY:  - Esophageal squamous mucosa with mild vascular congestion, and focal  squamous ballooning, suggestive of reflux esophagitis  - Negative for increased intraepithelial eosinophils    10/2022  - Esophageal plaques were found, suspicious for candidiasis. Brushings performed. - Erosive gastropathy with no bleeding and no stigmata of recent bleeding. Biopsied. - Normal duodenal bulb and second portion of the duodenum.   Last Colonoscopy:10/2022  - Four 3 to 13 mm polyps at the recto- sigmoid colon and in the sigmoid colon, removed with a cold snare. Resected and retrieved. - Non- bleeding external and internal hemorrhoids.  A. STOMACH, BIOPSY:  - Gastric antral mucosa with mild nonspecific reactive gastropathy  - Gastric oxyntic mucosa with no specific histopathologic changes  - Helicobacter pylori-like organisms are not identified on routine HE  stain   B. COLON, SIGMOID, RECTOSIGMOID, POLYPECTOMY:  - Hyperplastic polyp(s)    FHx: Mother colon cancer - after age 46 . Father multiple polyps  Surgical: no abdominal surgeries  Past Medical History: Past Medical History:  Diagnosis Date   Acid reflux    Anxiety    Arthritis    pelvic bone, neck   Back pain    BV (bacterial vaginosis) 05/09/2010   COPD (chronic obstructive pulmonary disease) (HCC) 2010   Dyspareunia, female 01/05/2015   Hx of goiter    Hx: UTI (urinary tract infection)    Hypertension    Osteopenia determined by x-ray 03/10/2014   Pelvic pain in female 03/01/2014   Rhinitis 02/15/2014   Urinary frequency 01/05/2015   Vaginal odor 01/05/2015    Past Surgical History: Past Surgical History:  Procedure Laterality Date   BIOPSY  10/23/2022  Procedure: BIOPSY;  Surgeon: Cinderella Deatrice FALCON, MD;  [facility];  Service: Endoscopy;;   BIOPSY THYROID  N/A 06/2009    had fna   BREAST BIOPSY Right in the 1990s   BREAST BIOPSY  1980   benign   CERVICAL FUSION N/A in the 1990s   COLONOSCOPY WITH PROPOFOL  N/A 10/23/2022   Procedure: COLONOSCOPY WITH PROPOFOL ;  Surgeon: Cinderella Deatrice FALCON, MD;  [facility];  Service: Endoscopy;  Laterality: N/A;  10:45am;asa 2   ESOPHAGEAL BRUSHING  10/23/2022   Procedure: ESOPHAGEAL BRUSHING;  Surgeon: Cinderella Deatrice FALCON, MD;  [facility];  Service: Endoscopy;;   ESOPHAGEAL DILATION N/A 07/24/2023   Procedure: DILATION, ESOPHAGUS;  Surgeon: Cinderella Deatrice FALCON, MD;  [facility];  Service: Endoscopy;  Laterality: N/A;   ESOPHAGOGASTRODUODENOSCOPY N/A 07/24/2023   Procedure: EGD (ESOPHAGOGASTRODUODENOSCOPY);  Surgeon: Cinderella Deatrice FALCON, MD;  [facility];  Service: Endoscopy;  Laterality: N/A;  11:00AM, ASA 1-2, pt knows to arrive at 7:00   ESOPHAGOGASTRODUODENOSCOPY (EGD) WITH PROPOFOL  N/A 10/23/2022   Procedure: ESOPHAGOGASTRODUODENOSCOPY (EGD) WITH PROPOFOL ;  Surgeon: Cinderella Deatrice FALCON, MD;  [facility];  Service: Endoscopy;  Laterality: N/A;  10:45am;asa 2   HYSTEROSCOPY W/ ENDOMETRIAL ABLATION N/A 2004   POLYPECTOMY  10/23/2022   Procedure: POLYPECTOMY;  Surgeon: Cinderella Deatrice FALCON, MD;  [facility];  Service: Endoscopy;;   TUBAL LIGATION      Family History: Family History  Problem Relation Age of Onset   Heart attack Paternal Grandmother    Breast cancer Maternal Grandmother    Emphysema Maternal Grandfather    Other Father        collapsed lung   COPD Father    Heart attack Father    Fibromyalgia Mother    Other Mother        sciatic nerve; bad shoulder   Colon cancer Mother    Anxiety disorder Brother    Other Brother        takes med for heart   Other Sister        valves replaced in heart   Heart attack Sister    Anxiety disorder Daughter    Cancer Maternal Aunt        cervical    Migraines Maternal Aunt    Breast cancer Maternal Aunt     Breast cancer Other    Migraines Other    Cancer Other        breast    Social History: Social History   Tobacco Use  Smoking Status Former   Current packs/day: 0.00   Average packs/day: 1 pack/day for 50.0 years (50.0 ttl pk-yrs)   Types: Cigarettes   Start date: 10/25/1972   Quit date: 10/2022   Years since quitting: 0.8   Passive exposure: Current  Smokeless Tobacco Never  Tobacco Comments   use electronic cigs - only does very occasionally    Social History   Substance and Sexual Activity  Alcohol Use No   Alcohol/week: 0.0 standard drinks of alcohol   Social History   Substance and Sexual Activity  Drug Use No    Allergies: Allergies  Allergen Reactions   Codeine Nausea Only   Metrogel  [Metronidazole ]     Had local reaction   Protonix  [Pantoprazole  Sodium] Rash    Medications: Current Outpatient Medications  Medication Sig Dispense Refill   acetaminophen  (TYLENOL ) 500 MG tablet Take 500 mg by mouth every 6 (six) hours as needed.  AIMOVIG  140 MG/ML SOAJ ADMINISTER 1 ML UNDER THE SKIN EVERY 3 MONTHS AS DIRECTED 1 mL 2   ALPRAZolam  (XANAX ) 1 MG tablet Take 1 tablet by mouth at bedtime. *May take 1 tablet three times daily as needed for anxiety  0   amLODipine  (NORVASC ) 5 MG tablet Take 1 tablet (5 mg total) by mouth 2 (two) times daily. 180 tablet 3   aspirin  81 MG chewable tablet Chew by mouth daily.     chlorthalidone  (HYGROTON ) 25 MG tablet Take 1 tablet (25 mg total) by mouth daily. 90 tablet 3   dicyclomine (BENTYL) 10 MG capsule TAKE 1 CAPSULE BY MOUTH FOUR TIMES DAILY AS NEEDED FOR ABDOMINAL CRAMPS AND PAIN Oral for 30 Days     DULoxetine (CYMBALTA) 30 MG capsule Take 60 mg by mouth daily.     fluconazole  (DIFLUCAN ) 200 MG tablet Take 2 tablets on day 1 then 1 tablet daily thereafter for a total of 28 days. 31 tablet 0   HYDROcodone -acetaminophen  (NORCO) 10-325 MG tablet Take 1 tablet by mouth every 6 (six) hours as needed.     levothyroxine  (SYNTHROID) 25 MCG tablet Take 25 mcg by mouth daily.     Melatonin 10 MG TABS Take 10 mg by mouth daily.     metoprolol  tartrate (LOPRESSOR ) 100 MG tablet Take 1 tablet (100 mg total) by mouth once for 1 dose. 2 hours prior to procedure 1 tablet 0   Multiple Vitamin (MULTIVITAMIN) tablet Take 1 tablet by mouth daily.     nortriptyline  (PAMELOR ) 50 MG capsule TAKE 1 CAPSULE BY MOUTH AT BEDTIME. 30 capsule 0   ondansetron  (ZOFRAN ) 4 MG tablet Take 1 tablet (4 mg total) by mouth every 6 (six) hours as needed for nausea or vomiting (migraine). 20 tablet 11   oxybutynin  (DITROPAN ) 5 MG tablet Take 5 mg by mouth at bedtime.     pantoprazole  (PROTONIX ) 40 MG tablet Take 40 mg by mouth daily.     Ubrogepant  (UBRELVY ) 100 MG TABS Take one tablet onset migraine and another tablet in 2 hours as needed  (max 2/24hr) 16 tablet 5   No current facility-administered medications for this visit.    Review of Systems: GENERAL: negative for malaise, night sweats HEENT: No changes in hearing or vision, no nose bleeds or other nasal problems. NECK: Negative for lumps, goiter, pain and significant neck swelling RESPIRATORY: Negative for cough, wheezing CARDIOVASCULAR: Negative for chest pain, leg swelling, palpitations, orthopnea GI: SEE HPI MUSCULOSKELETAL: Negative for joint pain or swelling, back pain, and muscle pain. SKIN: Negative for lesions, rash HEMATOLOGY Negative for prolonged bleeding, bruising easily, and swollen nodes. ENDOCRINE: Negative for cold or heat intolerance, polyuria, polydipsia and goiter. NEURO: negative for tremor, gait imbalance, syncope and seizures. The remainder of the review of systems is noncontributory.   Physical Exam: BP 106/73   Pulse 85   Temp 97.6 F (36.4 C)   Ht 5' 4 (1.626 m)   Wt 121 lb 11.2 oz (55.2 kg)   BMI 20.89 kg/m  GENERAL: The patient is AO x3, in no acute distress. HEENT: Head is normocephalic and atraumatic. EOMI are intact. Mouth is well hydrated  and without lesions. NECK: Supple. No masses LUNGS: Clear to auscultation. No presence of rhonchi/wheezing/rales. Adequate chest expansion HEART: RRR, normal s1 and s2. ABDOMEN: Soft, nontender, no guarding, no peritoneal signs, and nondistended. BS +. No masses. EXTREMITIES: Without any cyanosis, clubbing, rash, lesions or edema. NEUROLOGIC: AOx3, no focal motor deficit. SKIN: no  jaundice, no rashes   Imaging/Labs: as above     Latest Ref Rng & Units 12/30/2022    9:59 PM 12/01/2018    2:16 PM 02/15/2014   12:03 PM  CBC  WBC 4.0 - 10.5 K/uL 8.0  9.1  6.7   Hemoglobin 12.0 - 15.0 g/dL 86.8  86.8  86.3   Hematocrit 36.0 - 46.0 % 39.8  40.3  41.9   Platelets 150 - 400 K/uL 349  312  292    No results found for: IRON, TIBC, FERRITIN  I personally reviewed and interpreted the available labs, imaging and endoscopic files.  Esophagram  IMPRESSION: Cervical osteophytes with mild compression of the posterior esophagus. Moderate dysmotility with tertiary contractions. Tiny hiatal hernia. 13 mm barium tablet became stuck at the GE junction and did not pass.  Impression and Plan:  Felicia Greer is a 63 y.o. female with lumbar spondylosis, cervical radiculopathy, hypertension, chronic pain with family history of colon cancer/pancreatitis who presents for persistent dysphagia, worsening abdominal bloating distention with flatulence and constipation  # solid food dysphagia # Abnormal esophagogram #Refractory GERD   Patient had upper endoscopy September 2024 found to have Candida esophagitis status posttreatment  PPI has only slightly improved her nighttime awakening of heartburn and regurgitation Esophagram with GE junction thickening and moderate dysmotility  Repeat upper endoscopy performed with dilation to 18 mm 07/2023 with significant improvement in her dysphagia but nighttime awakening with heartburn regurgitation persists  Referral for high-resolution esophageal manometry  to evaluate for dysmotility disorder and 24-hour pH impedance off PPI  Continue PPI daily and add on at nighttime doses  #Worsening abdominal distention/bloating # Family history of pancreatitis # Cigarette exposure #Constipation   Patient has worsening abdominal bloating and flatulence could be SIBO But history of smoking and family history of pancreatic pathology will obtain CT abdomen pelvis with contrast  This could be chronic idiopathic constipation or IBS-C as patient has abdominal discomfort with bowel movement weekly only  Ensure adequate fluid intake: Aim for 8 glasses of water daily. Follow a high fiber diet: Include foods such as dates, prunes, pears, and kiwi. Take Miralax  twice a day for the first week, then reduce to once daily thereafter. May treatment empirically in future with rifaximin  Will start patient on Linzess  72 mcg and uptitrate   #Family history of colon cancer  Patient does  have any high risk factor for colorectal cancer malignancy given family history of Colon cancer in mother .    Last colonoscopy done 2024, in 2029  All questions were answered.      Syncere Kaminski Faizan Samayah Novinger, MD Gastroenterology and Hepatology [facility] Gastroenterology   This chart has been completed using [facility] Dictation software, and while attempts have been made to ensure accuracy , certain words and phrases may not be transcribed as intended

## 2023-08-26 NOTE — Addendum Note (Signed)
 Addended by: GAYLENE MADELIN CROME on: 08/26/2023 11:37 AM   Modules accepted: Orders

## 2023-08-28 ENCOUNTER — Telehealth (INDEPENDENT_AMBULATORY_CARE_PROVIDER_SITE_OTHER): Payer: Self-pay | Admitting: Gastroenterology

## 2023-08-28 NOTE — Telephone Encounter (Signed)
 Fax from [employer] stating they have approved coverage for Linzess  capsules. Approval valid from 08/27/23-/6/26 as long as pt remains covered by prescription drug plan and there are no changes to plan benefits.

## 2023-09-03 ENCOUNTER — Other Ambulatory Visit (INDEPENDENT_AMBULATORY_CARE_PROVIDER_SITE_OTHER): Payer: Self-pay | Admitting: Gastroenterology

## 2023-09-03 ENCOUNTER — Encounter: Payer: Self-pay | Admitting: Radiology

## 2023-09-03 ENCOUNTER — Ambulatory Visit
Admission: RE | Admit: 2023-09-03 | Discharge: 2023-09-03 | Disposition: A | Discharge status: Home / Self Care | Source: Ambulatory Visit | Attending: Gastroenterology | Admitting: Gastroenterology

## 2023-09-03 ENCOUNTER — Other Ambulatory Visit: Payer: Self-pay | Admitting: Neurology

## 2023-09-03 DIAGNOSIS — R140 Abdominal distension (gaseous): Secondary | ICD-10-CM | POA: Diagnosis not present

## 2023-09-03 DIAGNOSIS — R1319 Other dysphagia: Secondary | ICD-10-CM

## 2023-09-03 DIAGNOSIS — R109 Unspecified abdominal pain: Secondary | ICD-10-CM | POA: Diagnosis not present

## 2023-09-03 DIAGNOSIS — Z8379 Family history of other diseases of the digestive system: Secondary | ICD-10-CM

## 2023-09-03 DIAGNOSIS — K219 Gastro-esophageal reflux disease without esophagitis: Secondary | ICD-10-CM

## 2023-09-03 MED ORDER — IOPAMIDOL (ISOVUE-300) INJECTION 61%
100.0000 mL | Freq: Once | INTRAVENOUS | Status: AC | PRN
  Administered 2023-09-03 (×2): 100 mL via INTRAVENOUS

## 2023-09-05 ENCOUNTER — Telehealth: Payer: Self-pay | Admitting: Gastroenterology

## 2023-09-05 ENCOUNTER — Other Ambulatory Visit: Payer: Self-pay

## 2023-09-05 DIAGNOSIS — R1310 Dysphagia, unspecified: Secondary | ICD-10-CM

## 2023-09-05 DIAGNOSIS — K219 Gastro-esophageal reflux disease without esophagitis: Secondary | ICD-10-CM

## 2023-09-05 NOTE — Telephone Encounter (Signed)
 Patient returning phone call. Please advise, thank you

## 2023-09-05 NOTE — Telephone Encounter (Signed)
 Beth,  Referral in [facility] for an esophageal manometry and PH study.  Thanks [employer]

## 2023-09-05 NOTE — Telephone Encounter (Signed)
 Referral for esophageal manometry with 24 hour Ph study off of PPI Referring provider is Dr Deatrice JULIANNA Dine, MD   Called the patient to offer 09/17/23. Left my contact information. Asked for a return call.

## 2023-09-05 NOTE — Telephone Encounter (Signed)
 Spoke with the patient. Confirmed she has access to My Chart. She will review her instructions and call with any questions.

## 2023-09-10 DIAGNOSIS — I10 Essential (primary) hypertension: Secondary | ICD-10-CM | POA: Diagnosis not present

## 2023-09-10 DIAGNOSIS — F419 Anxiety disorder, unspecified: Secondary | ICD-10-CM | POA: Diagnosis not present

## 2023-09-10 DIAGNOSIS — Z6820 Body mass index (BMI) 20.0-20.9, adult: Secondary | ICD-10-CM | POA: Diagnosis not present

## 2023-09-15 ENCOUNTER — Ambulatory Visit (INDEPENDENT_AMBULATORY_CARE_PROVIDER_SITE_OTHER): Payer: Self-pay | Admitting: Gastroenterology

## 2023-09-15 NOTE — Progress Notes (Signed)
 Hi Tanya ,  Can you please call the patient and tell the patient the CT scan did not show any signs of tumor, which is a good news . 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 I would recommend talking to your primary care doctor about this   Thanks,  Eldwin Volkov Faizan Dolores Ewing, MD Gastroenterology and Hepatology [facility] Gastroenterology  =========  IMPRESSION: No acute findings.   Normal appearance of pancreas.   Horseshoe kidney.   Large stool burden noted; recommend clinical correlation for possible constipation.

## 2023-09-17 ENCOUNTER — Encounter (HOSPITAL_COMMUNITY)
Admission: RE | Disposition: A | Discharge status: Home / Self Care | Payer: Self-pay | Source: Home / Self Care | Attending: Gastroenterology

## 2023-09-17 ENCOUNTER — Ambulatory Visit (HOSPITAL_COMMUNITY)
Admission: RE | Admit: 2023-09-17 | Discharge: 2023-09-17 | Disposition: A | Discharge status: Home / Self Care | Attending: Gastroenterology | Admitting: Gastroenterology

## 2023-09-17 ENCOUNTER — Encounter (HOSPITAL_COMMUNITY): Payer: Self-pay | Admitting: Gastroenterology

## 2023-09-17 DIAGNOSIS — K3189 Other diseases of stomach and duodenum: Secondary | ICD-10-CM | POA: Diagnosis not present

## 2023-09-17 DIAGNOSIS — R140 Abdominal distension (gaseous): Secondary | ICD-10-CM | POA: Diagnosis not present

## 2023-09-17 DIAGNOSIS — K219 Gastro-esophageal reflux disease without esophagitis: Secondary | ICD-10-CM | POA: Diagnosis not present

## 2023-09-17 DIAGNOSIS — K2289 Other specified disease of esophagus: Secondary | ICD-10-CM | POA: Diagnosis not present

## 2023-09-17 DIAGNOSIS — R1310 Dysphagia, unspecified: Secondary | ICD-10-CM

## 2023-09-17 DIAGNOSIS — R12 Heartburn: Secondary | ICD-10-CM | POA: Diagnosis not present

## 2023-09-17 HISTORY — PX: 24 HOUR PH STUDY: SHX5419

## 2023-09-17 HISTORY — PX: ESOPHAGEAL MANOMETRY: SHX5429

## 2023-09-17 SURGERY — MANOMETRY, ESOPHAGUS
Anesthesia: Choice

## 2023-09-17 MED ORDER — LIDOCAINE VISCOUS HCL 2 % MT SOLN
OROMUCOSAL | Status: AC
  Filled 2023-09-17: qty 15

## 2023-09-17 SURGICAL SUPPLY — 2 items
FACESHIELD LNG OPTICON STERILE (SAFETY) IMPLANT
GLOVE BIO SURGEON STRL SZ8 (GLOVE) ×4 IMPLANT

## 2023-09-17 NOTE — Progress Notes (Signed)
 Esophageal Manometry done per protocol. Pt tolerated well with out complication. Ph with impedance done per protocol. Pt tolerated well. Instructions given regarding the study and monitor. Pt verbalized understand and return demonstrated use of monitor. Pt will return tomorrow to have probe removed and monitor downloaded.

## 2023-09-19 ENCOUNTER — Encounter (HOSPITAL_COMMUNITY): Payer: Self-pay | Admitting: Gastroenterology

## 2023-11-04 ENCOUNTER — Encounter (INDEPENDENT_AMBULATORY_CARE_PROVIDER_SITE_OTHER): Payer: Self-pay | Admitting: Gastroenterology

## 2023-11-05 ENCOUNTER — Other Ambulatory Visit (HOSPITAL_COMMUNITY): Payer: Self-pay

## 2023-11-05 DIAGNOSIS — Z87891 Personal history of nicotine dependence: Secondary | ICD-10-CM

## 2023-11-21 ENCOUNTER — Encounter (INDEPENDENT_AMBULATORY_CARE_PROVIDER_SITE_OTHER): Payer: Self-pay

## 2023-11-24 ENCOUNTER — Ambulatory Visit (HOSPITAL_COMMUNITY): Admission: RE | Admit: 2023-11-24 | Source: Ambulatory Visit

## 2023-11-24 ENCOUNTER — Encounter (HOSPITAL_COMMUNITY): Payer: Self-pay

## 2023-11-25 ENCOUNTER — Other Ambulatory Visit: Payer: Self-pay

## 2023-11-25 DIAGNOSIS — Z87891 Personal history of nicotine dependence: Secondary | ICD-10-CM

## 2023-12-02 ENCOUNTER — Ambulatory Visit
Admission: RE | Admit: 2023-12-02 | Discharge: 2023-12-02 | Disposition: A | Discharge status: Home / Self Care | Source: Ambulatory Visit

## 2023-12-02 DIAGNOSIS — Z87891 Personal history of nicotine dependence: Secondary | ICD-10-CM

## 2023-12-23 ENCOUNTER — Ambulatory Visit (INDEPENDENT_AMBULATORY_CARE_PROVIDER_SITE_OTHER): Admitting: Gastroenterology

## 2023-12-23 ENCOUNTER — Encounter (INDEPENDENT_AMBULATORY_CARE_PROVIDER_SITE_OTHER): Payer: Self-pay

## 2024-01-02 ENCOUNTER — Telehealth: Payer: Self-pay

## 2024-01-02 NOTE — Telephone Encounter (Signed)
 Pharmacy Patient Advocate Encounter   Received notification from CoverMyMeds that prior authorization for Ubrelvy  is due for renewal.   Insurance verification completed.   The patient is insured through [employer] [facility].  Action: Patient hasn't been seen in your office in over a year. Plan requires updated chart notes for PA renewal.

## 2024-01-13 ENCOUNTER — Ambulatory Visit (INDEPENDENT_AMBULATORY_CARE_PROVIDER_SITE_OTHER): Admitting: Gastroenterology

## 2024-01-21 DIAGNOSIS — R1310 Dysphagia, unspecified: Secondary | ICD-10-CM

## 2024-02-11 ENCOUNTER — Institutional Professional Consult (permissible substitution) (INDEPENDENT_AMBULATORY_CARE_PROVIDER_SITE_OTHER): Admitting: Otolaryngology

## 2024-03-09 ENCOUNTER — Institutional Professional Consult (permissible substitution) (INDEPENDENT_AMBULATORY_CARE_PROVIDER_SITE_OTHER): Admitting: Otolaryngology
